# Patient Record
Sex: Male | Born: 1970 | Race: White | Hispanic: No | Marital: Married | Smoking: Never smoker
Health system: Southern US, Community
[De-identification: ages and names within clinical notes are randomized; demographics above are authoritative.]

## PROBLEM LIST (undated history)

## (undated) DIAGNOSIS — IMO0002 Reserved for concepts with insufficient information to code with codable children: Secondary | ICD-10-CM

## (undated) DIAGNOSIS — I10 Essential (primary) hypertension: Secondary | ICD-10-CM

## (undated) DIAGNOSIS — E1142 Type 2 diabetes mellitus with diabetic polyneuropathy: Secondary | ICD-10-CM

## (undated) DIAGNOSIS — E109 Type 1 diabetes mellitus without complications: Secondary | ICD-10-CM

## (undated) DIAGNOSIS — E785 Hyperlipidemia, unspecified: Secondary | ICD-10-CM

## (undated) HISTORY — DX: Hyperlipidemia, unspecified: E78.5

---

## 2005-10-13 ENCOUNTER — Ambulatory Visit: Payer: Self-pay | Admitting: Family Medicine

## 2014-07-12 ENCOUNTER — Inpatient Hospital Stay
Admission: AD | Admit: 2014-07-12 | Disposition: A | Discharge status: Home / Self Care | Payer: Self-pay | Source: Other Acute Inpatient Hospital | Attending: Internal Medicine | Admitting: Internal Medicine

## 2014-07-12 ENCOUNTER — Other Ambulatory Visit: Payer: Self-pay | Admitting: Gastroenterology

## 2014-07-12 ENCOUNTER — Encounter: Payer: Self-pay | Admitting: Sleep Medicine

## 2014-07-12 DIAGNOSIS — R7881 Bacteremia: Principal | ICD-10-CM | POA: Diagnosis present

## 2014-07-12 DIAGNOSIS — B9561 Methicillin susceptible Staphylococcus aureus infection as the cause of diseases classified elsewhere: Principal | ICD-10-CM | POA: Diagnosis present

## 2014-07-12 HISTORY — DX: Reserved for concepts with insufficient information to code with codable children: IMO0002

## 2014-07-12 LAB — URINALYSIS REFLEX TO CULTURE
Glucose,UA: 1000 mg/dL — AB
Leuk Esterase,UA: NEGATIVE
Nitrite,UA: NEGATIVE
Protein,UA: 25 mg/dL — AB
Specific Gravity,UA: 1.012 (ref 1.001–1.030)
pH,UA: 7 (ref 5.0–8.0)

## 2014-07-12 LAB — COMPREHENSIVE METABOLIC PANEL
ALT: 41 U/L (ref 0–50)
AST: 29 U/L (ref 0–50)
Albumin: 3 g/dL — ABNORMAL LOW (ref 3.5–5.2)
Alk Phos: 39 U/L — ABNORMAL LOW (ref 40–130)
Anion Gap: 18 — ABNORMAL HIGH (ref 7–16)
Bilirubin,Total: 0.6 mg/dL (ref 0.0–1.2)
CO2: 22 mmol/L (ref 20–28)
Calcium: 8.2 mg/dL — ABNORMAL LOW (ref 9.0–10.3)
Chloride: 99 mmol/L (ref 96–108)
Creatinine: 0.72 mg/dL (ref 0.67–1.17)
GFR,Black: 131 *
GFR,Caucasian: 113 *
Globulin: 2.5 g/dL — ABNORMAL LOW (ref 2.7–4.3)
Glucose: 258 mg/dL — ABNORMAL HIGH (ref 60–99)
Lab: 8 mg/dL (ref 6–20)
Potassium: 3.2 mmol/L — ABNORMAL LOW (ref 3.3–5.1)
Sodium: 139 mmol/L (ref 133–145)
Total Protein: 5.5 g/dL — ABNORMAL LOW (ref 6.3–7.7)

## 2014-07-12 LAB — CBC AND DIFFERENTIAL
Baso # K/uL: 0 10*3/uL (ref 0.0–0.1)
Basophil %: 0.3 %
Eos # K/uL: 0 10*3/uL (ref 0.0–0.5)
Eosinophil %: 0.5 %
Hematocrit: 33 % — ABNORMAL LOW (ref 40–51)
Hemoglobin: 11.6 g/dL — ABNORMAL LOW (ref 13.7–17.5)
IMM Granulocytes #: 0.2 10*3/uL — ABNORMAL HIGH (ref 0.0–0.1)
IMM Granulocytes: 2.6 %
Lymph # K/uL: 0.5 10*3/uL — ABNORMAL LOW (ref 1.3–3.6)
Lymphocyte %: 8.3 %
MCH: 29 pg (ref 26–32)
MCHC: 35 g/dL (ref 32–37)
MCV: 81 fL (ref 79–92)
Mono # K/uL: 0.7 10*3/uL (ref 0.3–0.8)
Monocyte %: 12 %
Neut # K/uL: 4.4 10*3/uL (ref 1.8–5.4)
Nucl RBC # K/uL: 0 10*3/uL (ref 0.0–0.0)
Nucl RBC %: 0 /100 WBC (ref 0.0–0.2)
Platelets: 143 10*3/uL — ABNORMAL LOW (ref 150–330)
RBC: 4.1 MIL/uL — ABNORMAL LOW (ref 4.6–6.1)
RDW: 12.9 % (ref 11.6–14.4)
Seg Neut %: 76.3 %
WBC: 5.8 10*3/uL (ref 4.2–9.1)

## 2014-07-12 LAB — LACTATE, VENOUS, WHOLE BLOOD: Lactate VEN,WB: 1.4 mmol/L (ref 0.5–2.2)

## 2014-07-12 LAB — MAGNESIUM: Magnesium: 1.4 mEq/L (ref 1.3–2.1)

## 2014-07-12 LAB — PHOSPHORUS: Phosphorus: 1.7 mg/dL — ABNORMAL LOW (ref 2.7–4.5)

## 2014-07-12 LAB — SEDIMENTATION RATE, AUTOMATED: Sedimentation Rate: 78 mm/hr — ABNORMAL HIGH (ref 0–15)

## 2014-07-12 LAB — POCT GLUCOSE: Glucose POCT: 209 mg/dL — ABNORMAL HIGH (ref 60–99)

## 2014-07-12 MED ORDER — DEXTROSE 50 % IV SOLN *I*
25.0000 g | INTRAVENOUS | Status: DC | PRN
Start: 2014-07-12 — End: 2014-07-19

## 2014-07-12 MED ORDER — ACETAMINOPHEN 325 MG PO TABS *I*
650.0000 mg | ORAL_TABLET | ORAL | Status: DC | PRN
Start: 2014-07-12 — End: 2014-07-19
  Administered 2014-07-13 – 2014-07-19 (×6): 650 mg via ORAL

## 2014-07-12 MED ORDER — ONDANSETRON HCL 2 MG/ML IV SOLN *I*
4.0000 mg | Freq: Four times a day (QID) | INTRAMUSCULAR | Status: DC | PRN
Start: 2014-07-12 — End: 2014-07-19

## 2014-07-12 MED ORDER — ENOXAPARIN SODIUM 40 MG/0.4ML IJ SOSY *I*
40.0000 mg | PREFILLED_SYRINGE | Freq: Every day | INTRAMUSCULAR | Status: DC
Start: 2014-07-13 — End: 2014-07-19
  Administered 2014-07-13 – 2014-07-18 (×6): 40 mg via SUBCUTANEOUS
  Filled 2014-07-12 (×6): qty 0.4

## 2014-07-12 MED ORDER — INSULIN LISPRO (HUMAN) 100 UNIT/ML IJ/SC SOLN *WRAPPED*
0.0000 [IU] | Freq: Three times a day (TID) | SUBCUTANEOUS | Status: DC
Start: 2014-07-13 — End: 2014-07-14
  Administered 2014-07-13: 4 [IU] via SUBCUTANEOUS
  Administered 2014-07-13 (×2): 3 [IU] via SUBCUTANEOUS
  Administered 2014-07-14: 4 [IU] via SUBCUTANEOUS

## 2014-07-12 MED ORDER — GLUCOSE 40 % PO GEL *I*
15.0000 g | ORAL | Status: DC | PRN
Start: 2014-07-12 — End: 2014-07-19

## 2014-07-12 MED ORDER — SODIUM CHLORIDE 0.9 % IV SOLN WRAPPED *I*
50.0000 mL/h | Status: DC
Start: 2014-07-12 — End: 2014-07-13
  Administered 2014-07-12: 125 mL/h via INTRAVENOUS
  Administered 2014-07-13: 50 mL/h via INTRAVENOUS

## 2014-07-12 MED ORDER — GLUCAGON HCL (RDNA) 1 MG IJ SOLR *WRAPPED*
1.0000 mg | INTRAMUSCULAR | Status: DC | PRN
Start: 2014-07-12 — End: 2014-07-19

## 2014-07-12 MED ORDER — INSULIN GLARGINE 100 UNIT/ML SC SOLN *WRAPPED*
10.0000 [IU] | Freq: Every evening | SUBCUTANEOUS | Status: DC
Start: 2014-07-12 — End: 2014-07-13
  Administered 2014-07-12: 10 [IU] via SUBCUTANEOUS

## 2014-07-12 NOTE — H&P (Addendum)
General H&P for Inpatients    Chief Complaint: Bacteremia    History of Present Illness: This is a 43 y.o. Male with hx of uncontrolled DM (last HbA1c reportedly 13) who was transferred to [facility] from OSH for further management of S. Aureus bacteremia.     The patient lives in [place] and is up visiting relatives in [place]. States that about one week ago he developed non-productive cough, fevers, chills, rigors, nausea and non-bloody vomiting and subsequently presented to OSH 4 days ago. OSH documentation shows he had documented fever 38.6, HR 120-130 on presentation, normal WBC and HCT, AG 32, HC03 16, +urine ketones, Sodium 132, BG 349. He was admitted and subsequently found to have 4/4 blood cx growing S. Aureus ?due to ulcerations on soles of feet. Although notes mention that the lucerations did not appear cellulitic.  He was started on IV vancomycin. Transthoracic echo showed LVEF 04%, no systolic dysfunction, no vegetations, no pericardial effusion. However the study was somewhat equivocal with technically borderline images. Xray of foot was not indicative of osteomyelitis. CXR reportedly was without abnormality. CRP elevated at 30. Lactate normal. The plantar ulcerations were debrided by podiatry at OSH. He was started on metformin (home med), levemir, Lipitor, losartan, gabapentin. During his 4 day stay at OSH he remained HD stable with normal repeat lactate. ABG today showed 7.42/23/85 today. He did develop diarrhea while at OSH and a Cdiff EIA was sent, but not resulted. He was transferred to [facility] for transesophageal echo and further workup/management of staph aureus bacteremia.     Regarding the patient's state of health leading up to this episode, he states he has been in good health, but drinks a lot of "mountain dew" although he knows he should not. He states that the blisters on his feet have been present for about 6 months, but only about 2 weeks ago he noticed foul smelling drainage from the right  foot. Otherwise he has not had any pain in the feet or in the areas of the ulcers and does not think they are infected.     Patient denies CP, abdominal pain urinary sx. Denies IV drug use.     HPI    Past Medical History   Diagnosis Date    Diabetes mellitus type 2, uncontrolled      No past surgical history on file.  No family history on file.  History     Social History    Marital Status: Married     Spouse Name: N/A     Number of Children: N/A    Years of Education: N/A     Social History Main Topics    Smoking status: Not on file    Smokeless tobacco: Not on file    Alcohol Use: Not on file    Drug Use: Not on file    Sexual Activity: Not on file     Other Topics Concern    Not on file     Social History Narrative    No narrative on file       Allergies: No Known Allergies (drug, envir, food or latex)    Prescriptions prior to admission   Medication Sig    metFORMIN (GLUCOPHAGE) 1000 MG tablet Take 1,000 mg by mouth 2 times daily (with meals)      Current Facility-Administered Medications   Medication Dose Route Frequency    [START ON 07/13/2014] enoxaparin (LOVENOX) injection 40 mg  40 mg Subcutaneous Daily    sodium  chloride 0.9 % IV  125 mL/hr Intravenous Continuous    acetaminophen (TYLENOL) tablet 650 mg  650 mg Oral Q4H PRN    ondansetron (ZOFRAN) injection 4 mg  4 mg Intravenous Q6H PRN    [START ON 07/13/2014] insulin lispro (HumaLOG) injection 0-20 Units  0-20 Units Subcutaneous TID WC    dextrose (GLUTOSE) 40 % oral gel 15 g  15 g Oral PRN    Dextrose 50 % solution 25 g  25 g Intravenous PRN    glucagon (GLUCAGEN) injection 1 mg  1 mg Intramuscular PRN    insulin glargine (LANTUS) injection 10 Units  10 Units Subcutaneous Nightly       Review of Systems:   Review of Systems   Constitutional: Positive for fever, chills and malaise/fatigue. Negative for weight loss.   HENT: Positive for congestion and sore throat.    Eyes: Negative for blurred vision, double vision and photophobia.    Respiratory: Positive for cough. Negative for hemoptysis, sputum production, shortness of breath and wheezing.    Cardiovascular: Negative for chest pain and leg swelling.   Gastrointestinal: Positive for nausea, vomiting and diarrhea. Negative for abdominal pain, constipation and blood in stool.   Genitourinary: Negative for dysuria and urgency.   Musculoskeletal: Positive for myalgias. Negative for neck pain.   Neurological: Negative for dizziness, sensory change, speech change, focal weakness and headaches.       Last Nursing documented pain:        Patient Vitals for the past 24 hrs:   BP Temp Temp src Pulse Resp SpO2   07/12/14 2000 128/82 mmHg 36.9 C (98.4 F) Axillary 94 20 95 %     O2 Device: Nasal cannula (07/12/14 2000)  O2 Flow Rate: 4 L/min (07/12/14 2000)      Physical Exam   Constitutional: He is oriented to person, place, and time. He appears well-developed and well-nourished.   HENT:   Head: Normocephalic and atraumatic.   Dry mucous membranes   Eyes: EOM are normal. Pupils are equal, round, and reactive to light.   Neck: Normal range of motion. Neck supple. No tracheal deviation present.   Cardiovascular: Normal rate and regular rhythm.    Pulmonary/Chest: Effort normal and breath sounds normal. No respiratory distress. He has no wheezes. He has no rales.   Abdominal: Soft. Bowel sounds are normal. He exhibits no distension and no mass. There is no tenderness. There is no rebound and no guarding.   Musculoskeletal: He exhibits no edema or tenderness.   Neurological: He is alert and oriented to person, place, and time.   Skin: Skin is warm.   Bilat plantar ulcerations under 1st MTP joints with dried blood. No erythema or drainage. No induration around the sites or pain with palpation around the ulcerations.       Lab Results:   All labs in the last 24 hours:   Recent Results (from the past 24 hour(s))   POCT glucose    Collection Time: 07/12/14 10:13 PM   Result Value Ref Range    Glucose POCT 209  (H) 60 - 99 mg/dL   Urinalysis reflex to culture    Collection Time: 07/12/14 11:00 PM   Result Value Ref Range    Color, UA Lt Yellow (A) Yellow    Appearance,UR CLEAR Clear    Specific Gravity,UA 1.012 1.001 - 1.030    Leuk Esterase,UA NEG NEGATIVE    Nitrite,UA NEG NEGATIVE    pH,UA 7.0 5.0 - 8.0  Protein,UA 25 (A) NEGATIVE mg/dL    Glucose,UA 1000 (A) mg/dL    Ketones, UA 3+ (A) NEGATIVE    Blood,UA TRACE (A) NEGATIVE   CBC and differential    Collection Time: 07/12/14 11:17 PM   Result Value Ref Range    WBC 5.8 4.2 - 9.1 THOU/uL    RBC 4.1 (L) 4.6 - 6.1 MIL/uL    Hemoglobin 11.6 (L) 13.7 - 17.5 g/dL    Hematocrit 33 (L) 40 - 51 %    MCV 81 79 - 92 fL    MCH 29 26 - 32 pg    MCHC 35 32 - 37 g/dL    RDW 12.9 11.6 - 14.4 %    Platelets 143 (L) 150 - 330 THOU/uL    Seg Neut % 76.3 %    Lymphocyte % 8.3 %    Monocyte % 12.0 %    Eosinophil % 0.5 %    Basophil % 0.3 %    Neut # K/uL 4.4 1.8 - 5.4 THOU/uL    Lymph # K/uL 0.5 (L) 1.3 - 3.6 THOU/uL    Mono # K/uL 0.7 0.3 - 0.8 THOU/uL    Eos # K/uL 0.0 0.0 - 0.5 THOU/uL    Baso # K/uL 0.0 0.0 - 0.1 THOU/uL    Nucl RBC % 0.0 0.0 - 0.2 /100 WBC    Nucl RBC # K/uL 0.0 0.0 - 0.0 THOU/uL    IMM Granulocytes # 0.2 (H) 0.0 - 0.1 THOU/uL    IMM Granulocytes 2.6 %   Lactate, venous, whole blood    Collection Time: 07/12/14 11:17 PM   Result Value Ref Range    Lactate VEN,WB 1.4 0.5 - 2.2 mmol/L       Radiology Impressions (last 3 days):  No results found.    Currently Active/Followed Hospital Problems:  Active Hospital Problems    Diagnosis    Bacteremia due to Staphylococcus aureus       Assessment:  This is a 43 y.o. Male with hx of uncontrolled DM (last HbA1c reportedly 13) who was transferred to [facility] from OSH for further management of S. Aureus bacteremia. He had presented to OSH with mild DKA and was found to have 4/4 blood cx growing S.Aureus. Unclear etiology for bacteremia ?inoculation from plantar ulcerations, however they do not appear cellulitic. TTE benign at OSH,  but he will need TEE and he is aware of this.       Plan:   - NPO at [facility] for TEE  - ABX: Will need AM vanco level at 0800. Vanco was still infusing upon his arrival at~2000. (OSH was dosing Vanco 750 mg IV Q12) Will need to re-dose with pharmacy here once the level is obtained in AM  - IVF: NS at 125 ml/hr  - Obtain fresh labs: CBCw/diff, CMP, CRP, ESR, blood cx x 2, lactate, UA  - Need to evaluate his acid/base status /presence of ketones because it appears that he was acidotic today at OSH  - ?on 4L NC unclear why. Pulm exam ok. No mention of hypoxia in OSH notes. Will up load CXR from disc and attempt to wean. If unable will continue working this up.  - Hold metformin, start SSI with 10 units nightly lantus (titrate up as needed)  - Day team to determine continuing ARB and statin which were started by OSH  - DVT ppx- SQ lovenox  - Full code    Author: Alfonse Spruce, [place]  Note created: 07/12/2014  at: 10:52 PM     Addendum: 2:25 AM 07/13/2014  Patient unable to be weaned from 4L NC, so portable CXR obtained. D/w radiologist. Pulmonary congestion c/w pulmonary edema on chest film. Atypical PNA possible as well.    Doubt Staph PNA in an otherwise healthy, functional individual living in the community. So, doubt pulm infection as source for bacteramia. It is more likely that he has some fluid overload from IVF resuscitation at OSH. BNP added on and is elevated at 1180.     Will decrease IVF to 50 ml/hr while NPO. Hopefully he will auto diurese since he has good renal function. If not, may need to trial 1 x dose of lasix once he is off IVF tomorrow. Strict I/Os. Consider lateral chest film as well.

## 2014-07-13 ENCOUNTER — Encounter: Payer: Self-pay | Admitting: Sleep Medicine

## 2014-07-13 ENCOUNTER — Ambulatory Visit: Payer: Self-pay | Admitting: Cardiology

## 2014-07-13 ENCOUNTER — Inpatient Hospital Stay: Payer: Self-pay | Admitting: Cardiology

## 2014-07-13 DIAGNOSIS — I38 Endocarditis, valve unspecified: Secondary | ICD-10-CM | POA: Diagnosis present

## 2014-07-13 LAB — TEE
BMI: 30.7 kg/m2
BP Diastolic: 84 mmHg
BP Systolic: 120 mmHg
BSA: 2.28 m2
Heart Rate: 75 {beats}/min
Height: 72 in
RR Interval: 800 ms
Weight: 3616 oz

## 2014-07-13 LAB — POCT GLUCOSE
Glucose POCT: 244 mg/dL — ABNORMAL HIGH (ref 60–99)
Glucose POCT: 255 mg/dL — ABNORMAL HIGH (ref 60–99)
Glucose POCT: 229 mg/dL — ABNORMAL HIGH (ref 60–99)
Glucose POCT: 287 mg/dL — ABNORMAL HIGH (ref 60–99)
Glucose POCT: 320 mg/dL — ABNORMAL HIGH (ref 60–99)

## 2014-07-13 LAB — BASIC METABOLIC PANEL
Anion Gap: 16 (ref 7–16)
CO2: 23 mmol/L (ref 20–28)
Calcium: 8.2 mg/dL — ABNORMAL LOW (ref 9.0–10.3)
Chloride: 100 mmol/L (ref 96–108)
Creatinine: 0.72 mg/dL (ref 0.67–1.17)
GFR,Black: 131 *
GFR,Caucasian: 113 *
Glucose: 239 mg/dL — ABNORMAL HIGH (ref 60–99)
Lab: 10 mg/dL (ref 6–20)
Potassium: 3.5 mmol/L (ref 3.3–5.1)
Sodium: 139 mmol/L (ref 133–145)

## 2014-07-13 LAB — HOLD SST

## 2014-07-13 LAB — URINE MICROSCOPIC (IQ200)

## 2014-07-13 LAB — DRUGS OF ABUSE SCREEN WITH CONFIRMATION
Amphetamine,UR: NEGATIVE
Barbiturate,UR: NEGATIVE
Benzodiazepinen,UR: POSITIVE — AB
Cocaine/Metab,UR: NEGATIVE
Opiates,UR: NEGATIVE
PCP,UR: NEGATIVE
THC Metabolite,UR: NEGATIVE
Tricyclics,UR: NEGATIVE

## 2014-07-13 LAB — CLOSTRIDIUM DIFFICILE EIA: C difficile Toxins A and B EIA: 0

## 2014-07-13 LAB — NT-PRO BNP: NT-pro BNP: 1180 pg/mL — ABNORMAL HIGH (ref 0–450)

## 2014-07-13 LAB — PHOSPHORUS: Phosphorus: 2.7 mg/dL (ref 2.7–4.5)

## 2014-07-13 LAB — CRP: CRP: 217 mg/L — ABNORMAL HIGH (ref 0–10)

## 2014-07-13 LAB — VANCOMYCIN, RANDOM: Vancomycin Level: 3.7 ug/mL

## 2014-07-13 MED ORDER — INFLUENZA VAC SPLIT QUAD (FLULAVAL) 0.5 ML IM SUSY PF(>=6 MONTHS) *I*
0.5000 mL | PREFILLED_SYRINGE | INTRAMUSCULAR | Status: AC
Start: 2014-07-13 — End: 2014-07-19
  Administered 2014-07-19: 0.5 mL via INTRAMUSCULAR
  Filled 2014-07-13: qty 0.5

## 2014-07-13 MED ORDER — BUTAMBEN-TETRACAINE-BENZOCAINE 2-2-14 % EX AERO *I*
1.0000 | INHALATION_SPRAY | Freq: Once | CUTANEOUS | Status: AC
Start: 2014-07-13 — End: 2014-07-13
  Filled 2014-07-13: qty 56

## 2014-07-13 MED ORDER — FENTANYL CITRATE 50 MCG/ML IJ SOLN *WRAPPED*
INTRAMUSCULAR | Status: AC
Start: 2014-07-13 — End: 2014-07-13
  Filled 2014-07-13: qty 2

## 2014-07-13 MED ORDER — MIDAZOLAM HCL 1 MG/ML IJ SOLN *I* WRAPPED
INTRAMUSCULAR | Status: AC | PRN
Start: 2014-07-13 — End: 2014-07-13
  Administered 2014-07-13: 2 mg via INTRAVENOUS
  Administered 2014-07-13: 1 mg via INTRAVENOUS
  Administered 2014-07-13: 2 mg via INTRAVENOUS
  Administered 2014-07-13: 1 mg via INTRAVENOUS

## 2014-07-13 MED ORDER — MIDAZOLAM HCL 5 MG/5ML IJ SOLN *I*
INTRAMUSCULAR | Status: AC
Start: 2014-07-13 — End: 2014-07-13
  Filled 2014-07-13: qty 5

## 2014-07-13 MED ORDER — GUAIFENESIN 100 MG/5ML PO SYRUP *WRAPPED*
200.0000 mg | ORAL_SOLUTION | Freq: Four times a day (QID) | ORAL | Status: DC | PRN
Start: 2014-07-13 — End: 2014-07-19
  Administered 2014-07-14 – 2014-07-16 (×4): 200 mg via ORAL
  Filled 2014-07-13 (×4): qty 10

## 2014-07-13 MED ORDER — POTASSIUM & SODIUM PHOSPHATES 280-160-250 MG PO PACK *I*
2.0000 | PACK | Freq: Once | ORAL | Status: AC
Start: 2014-07-13 — End: 2014-07-13
  Administered 2014-07-13: 2 via ORAL
  Filled 2014-07-13: qty 2

## 2014-07-13 MED ORDER — POTASSIUM CHLORIDE CRYS CR 20 MEQ PO TBCR *I*
20.0000 meq | ORAL_TABLET | Freq: Once | ORAL | Status: AC
Start: 2014-07-13 — End: 2014-07-13
  Administered 2014-07-13: 20 meq via ORAL
  Filled 2014-07-13: qty 1

## 2014-07-13 MED ORDER — FENTANYL CITRATE 50 MCG/ML IJ SOLN *WRAPPED*
INTRAMUSCULAR | Status: AC | PRN
Start: 2014-07-13 — End: 2014-07-13
  Administered 2014-07-13: 25 ug via INTRAVENOUS
  Administered 2014-07-13 (×2): 50 ug via INTRAVENOUS

## 2014-07-13 MED ORDER — SODIUM CHLORIDE 0.9 % IV SOLN WRAPPED *I*
30.0000 mL/h | Status: DC
Start: 2014-07-13 — End: 2014-07-13
  Administered 2014-07-13: 30 mL/h via INTRAVENOUS

## 2014-07-13 MED ORDER — POTASSIUM CHLORIDE CRYS CR 20 MEQ PO TBCR *I*
40.0000 meq | ORAL_TABLET | Freq: Once | ORAL | Status: AC
Start: 2014-07-13 — End: 2014-07-13
  Administered 2014-07-13: 40 meq via ORAL
  Filled 2014-07-13: qty 2

## 2014-07-13 MED ORDER — INSULIN GLARGINE 100 UNIT/ML SC SOLN *WRAPPED*
15.0000 [IU] | Freq: Every evening | SUBCUTANEOUS | Status: DC
Start: 2014-07-13 — End: 2014-07-14
  Administered 2014-07-13: 15 [IU] via SUBCUTANEOUS

## 2014-07-13 MED ORDER — VANCOMYCIN HCL IN D5W 2000 MG/500 ML IV SOLN *I*
2000.0000 mg | Freq: Two times a day (BID) | INTRAVENOUS | Status: DC
Start: 2014-07-13 — End: 2014-07-15
  Administered 2014-07-13 – 2014-07-14 (×3): 2000 mg via INTRAVENOUS
  Filled 2014-07-13 (×5): qty 500

## 2014-07-13 MED ORDER — FUROSEMIDE 10 MG/ML IJ SOLN *I*
20.0000 mg | Freq: Once | INTRAMUSCULAR | Status: AC
Start: 2014-07-13 — End: 2014-07-13
  Administered 2014-07-13: 20 mg via INTRAVENOUS
  Filled 2014-07-13: qty 2

## 2014-07-13 MED ORDER — MENTHOL THROAT LOZENGE *I*
1.0000 | LOZENGE | OROMUCOSAL | Status: DC | PRN
Start: 2014-07-13 — End: 2014-07-19

## 2014-07-13 NOTE — Progress Notes (Signed)
Utilization Management    Level of Care Inpatient as of the date 07/12/14      Symir Mah A Evadene Wardrip, RN     Pager: [phone]

## 2014-07-13 NOTE — Preop H&P (Addendum)
Subjective:     Patient with a history of bacteremia. He was admitted with complaints of fever, dyspnea. Now referred for TEE to evaluate for endocarditis.    Additional Risk Factors  Current CHF  Chest xray showing pulmonary congestion.     History:  Past Medical History   Diagnosis Date    Diabetes mellitus type 2, uncontrolled     Diabetes mellitus        Allergies: No Known Allergies (drug, envir, food or latex)      Prior to Admission Medications:    Prescriptions prior to admission   Medication Sig    metFORMIN (GLUCOPHAGE) 1000 MG tablet Take 1,000 mg by mouth 2 times daily (with meals)         Active Medications:  Current Facility-Administered Medications   Medication Dose Route Frequency    Influenza vaccine (FLUARIX) syringe 0.75mL - Quad, PF, for patients aged 3 years and older  0.5 mL Intramuscular Prior to discharge -vaccine    sodium chloride 0.9 % IV  30 mL/hr Intravenous Continuous    butamben-tetracaine-benzocaine (CETACAINE) spray 1 spray  1 spray Topical Once    enoxaparin (LOVENOX) injection 40 mg  40 mg Subcutaneous Daily    sodium chloride 0.9 % IV  50 mL/hr Intravenous Continuous    acetaminophen (TYLENOL) tablet 650 mg  650 mg Oral Q4H PRN    ondansetron (ZOFRAN) injection 4 mg  4 mg Intravenous Q6H PRN    insulin lispro (HumaLOG) injection 0-20 Units  0-20 Units Subcutaneous TID WC    dextrose (GLUTOSE) 40 % oral gel 15 g  15 g Oral PRN    Dextrose 50 % solution 25 g  25 g Intravenous PRN    glucagon (GLUCAGEN) injection 1 mg  1 mg Intramuscular PRN    insulin glargine (LANTUS) injection 10 Units  10 Units Subcutaneous Nightly          Objective:     Physical Exam  Vitals:  Blood pressure 125/70, pulse 89, temperature 36.4 C (97.5 F), temperature source Temporal, resp. rate 25, height 182.9 cm (6'), weight 102.513 kg (226 lb), SpO2 96 %.           Dentition:No loose or broken teeth.    Jugular venous pressure: Not well seen    Airway Visibility: posterior pharynx    Breath  sounds: rales at bases  Cardiovascular:  normal S1,S2 without murmur, rubs, gallops    Neuro exam: awake, alert, and oriented x 3  Lab Review     Recent Labs  Lab 07/13/14  0939 07/12/14  2317   SODIUM 139 139   POTASSIUM 3.5 3.2*   CHLORIDE 100 99   CO2 23 22   HEMOGLOBIN  --  11.6*   HEMATOCRIT  --  33*   PLATELETS  --  143*       No components found with this basename: BUN, CREATININE, EGFR, GLUCOSE      Anesthesiologist's Physical Status rating of the patient: Class III: Severe Systemic Disease    Plan for sedation: Moderate  I am evaluating the patient immediately prior to admission of sedation medication. The plan for sedation remains appropriate.      Assessment:    Indications for procedure: Bacteremia     Plan:   TEE

## 2014-07-13 NOTE — Progress Notes (Signed)
Pharmacist Pharmacokinetic Note - Vancomycin    General  Patient was started on vancomycin for: bacteremia      First dose: 2000    Goal vancomycin trough concentration: 15-20 mcg/mL    Laboratory Data      Lab results: 07/12/14  2317   WBC 5.8         Lab results: 07/13/14  0939 07/12/14  2317   CREATININE 0.72 0.72         Lab results: 07/13/14  0939 07/12/14  2317   UN 10 8           Vancomycin concentrations:        Lab results: 07/13/14  0939   VANCOMYCIN LEVEL 3.7           Recommendation  Based on calculations, patient's renal function, and volume status, recommend the following dose: 2000 mg IV every 12 hours.     Recommend vancomycin trough concentration to be drawn on: 1/2 prior to 0300 dose     Discussed plan with provider Larey DaysMichael Ferrantino, MD.   For questions contact pharmacy at extension [phone].      Johnnye SimaAndrew Willma Obando, PharmD

## 2014-07-13 NOTE — H&P (Signed)
General H&P for Inpatients    Chief Complaint: MRSA bacteremia    History of Present Illness:  HPI  43 yo man with poorly controlled type 2 diabetes, who presented to [facility] on 12/27 with symptoms including nausea, vomiting, headache, congestion, dry cough, poor po intake, and fevers/rigors/chills. He is visiting [place] from his home in [place]. Initially he was felt to have a viral syndrome, however multiple blood culture bottles grew MRSA. He was placed on IV vancomycin. TTE showed no evidence for endocarditis, but was a limited study. He denies any history of IVDU. He has bilateral foot callouses but they have not been open or draining. He was transferred to [facility] for further workup and management.    Past Medical History   Diagnosis Date    Diabetes mellitus type 2, uncontrolled      History reviewed. No pertinent past surgical history.  Family History   Problem Relation Age of Onset    Diabetes Mother     Diabetes Father      History     Social History    Marital Status: Married     Spouse Name: N/A     Number of Children: N/A    Years of Education: N/A     Social History Main Topics    Smoking status: Never Smoker     Smokeless tobacco: None    Alcohol Use: No    Drug Use: No    Sexual Activity: None     Other Topics Concern    None     Social History Narrative       Allergies: No Known Allergies (drug, envir, food or latex)    Prescriptions prior to admission   Medication Sig    metFORMIN (GLUCOPHAGE) 1000 MG tablet Take 1,000 mg by mouth 2 times daily (with meals)      Current Facility-Administered Medications   Medication Dose Route Frequency    Influenza vaccine (FLUARIX) syringe 0.74mL - Quad, PF, for patients aged 3 years and older  0.5 mL Intramuscular Prior to discharge -vaccine    butamben-tetracaine-benzocaine (CETACAINE) spray 1 spray  1 spray Topical Once    vancomycin in D5W (VANCOCIN) IVPB 2,000 mg  2,000 mg Intravenous Q12H    enoxaparin (LOVENOX) injection  40 mg  40 mg Subcutaneous Daily    acetaminophen (TYLENOL) tablet 650 mg  650 mg Oral Q4H PRN    ondansetron (ZOFRAN) injection 4 mg  4 mg Intravenous Q6H PRN    insulin lispro (HumaLOG) injection 0-20 Units  0-20 Units Subcutaneous TID WC    dextrose (GLUTOSE) 40 % oral gel 15 g  15 g Oral PRN    Dextrose 50 % solution 25 g  25 g Intravenous PRN    glucagon (GLUCAGEN) injection 1 mg  1 mg Intramuscular PRN    insulin glargine (LANTUS) injection 10 Units  10 Units Subcutaneous Nightly       Review of Systems:   Review of Systems   Constitutional: Positive for fever and chills.   Respiratory: Positive for cough and shortness of breath.    Cardiovascular: Negative for chest pain.   Gastrointestinal: Positive for nausea, vomiting and diarrhea. Negative for abdominal pain.   Skin: Negative for rash.   Neurological: Positive for headaches.   All other systems reviewed and are negative.      Last Nursing documented pain:  0-10 Scale: 0 (07/13/14 1317)  Revised FLACC Score: 0 (07/13/14 1248)  Patient Vitals for the past 24 hrs:   BP Temp Temp src Pulse Resp SpO2 Height Weight   07/13/14 1412 130/80 mmHg 36 C (96.8 F) TEMPORAL 93 18 96 % - -   07/13/14 1317 100/61 mmHg - - 85 19 95 % - -   07/13/14 1307 106/62 mmHg - - 84 20 94 % - -   07/13/14 1257 104/63 mmHg - - 84 20 92 % - -   07/13/14 1248 115/66 mmHg - - 85 20 95 % - -   07/13/14 1242 117/67 mmHg - - 87 16 95 % - -   07/13/14 1237 118/67 mmHg - - 85 16 97 % - -   07/13/14 1232 122/68 mmHg - - 89 20 93 % - -   07/13/14 1227 142/77 mmHg - - 95 24 96 % - -   07/13/14 1209 125/70 mmHg - - 89 (!) 25 96 % - -   07/13/14 1143 123/70 mmHg - - 85 20 95 % - -   07/13/14 1139 - 36.4 C (97.5 F) TEMPORAL - - - 1.829 m (6') 102.513 kg (226 lb)   07/13/14 0932 122/80 mmHg 36.8 C (98.2 F) TEMPORAL 88 20 93 % - -   07/13/14 0615 132/90 mmHg 36.9 C (98.4 F) TEMPORAL 98 20 94 % - -   07/13/14 0252 128/82 mmHg 37.1 C (98.8 F) TEMPORAL 107 22 94 % - -   07/12/14 2335  - - - - - 94 % - -   07/12/14 2330 - - - - - 91 % - -   07/12/14 2325 - - - - - (!) 87 % - -   07/12/14 2320 - - - - - 90 % - -   07/12/14 2315 - - - - - 94 % - -   07/12/14 2000 128/82 mmHg 36.9 C (98.4 F) Axillary 94 20 95 % 1.829 m (6') 102.649 kg (226 lb 4.8 oz)     O2 Device: Nasal cannula (07/13/14 1317)  O2 Flow Rate: 4 L/min (07/13/14 1317)      Physical Exam   Constitutional: He appears well-developed and well-nourished. No distress.   HENT:   Head: Normocephalic and atraumatic.   Mouth/Throat: Oropharynx is clear and moist. No oropharyngeal exudate.   Eyes: Conjunctivae are normal. No scleral icterus.   Neck: Neck supple.   Cardiovascular: Normal rate, regular rhythm and normal heart sounds.  Exam reveals no gallop and no friction rub.    No murmur heard.  Pulmonary/Chest: Effort normal. No respiratory distress. He has no wheezes. He has rales.   Abdominal: Soft. Bowel sounds are normal. He exhibits no distension. There is no tenderness. There is no rebound and no guarding.   Musculoskeletal: He exhibits no edema or tenderness.   Lymphadenopathy:     He has no cervical adenopathy.   Neurological: He is alert.   Skin: Skin is warm and dry. No erythema.   Bilateral callouses on plantar first metatarsal surface, no open wounds or drainage   Psychiatric: He has a normal mood and affect.       Lab Results:   All labs in the last 24 hours:   Recent Results (from the past 24 hour(s))   POCT glucose    Collection Time: 07/12/14 10:13 PM   Result Value Ref Range    Glucose POCT 209 (H) 60 - 99 mg/dL   Clostridium difficile EIA    Collection Time: 07/12/14 11:00 PM  Result Value Ref Range    C difficile Toxins A&B EIA .    Urinalysis reflex to culture    Collection Time: 07/12/14 11:00 PM   Result Value Ref Range    Color, UA Lt Yellow (A) Yellow    Appearance,UR CLEAR Clear    Specific Gravity,UA 1.012 1.001 - 1.030    Leuk Esterase,UA NEG NEGATIVE    Nitrite,UA NEG NEGATIVE    pH,UA 7.0 5.0 - 8.0    Protein,UA  25 (A) NEGATIVE mg/dL    Glucose,UA 1000 (A) mg/dL    Ketones, UA 3+ (A) NEGATIVE    Blood,UA TRACE (A) NEGATIVE   Urine microscopic (iq200)    Collection Time: 07/12/14 11:00 PM   Result Value Ref Range    RBC,UA 0 - 2 0 - 2 /hpf    WBC,UA 0 - 5 0 - 5 /hpf   Blood culture    Collection Time: 07/12/14 11:16 PM   Result Value Ref Range    Bacterial Blood Culture .    Blood culture    Collection Time: 07/12/14 11:16 PM   Result Value Ref Range    Bacterial Blood Culture .    Comprehensive metabolic panel    Collection Time: 07/12/14 11:17 PM   Result Value Ref Range    Sodium 139 133 - 145 mmol/L    Potassium 3.2 (L) 3.3 - 5.1 mmol/L    Chloride 99 96 - 108 mmol/L    CO2 22 20 - 28 mmol/L    Anion Gap 18 (H) 7 - 16    UN 8 6 - 20 mg/dL    Creatinine 0.72 0.67 - 1.17 mg/dL    GFR,Caucasian 113 *    GFR,Black 131 *    Glucose 258 (H) 60 - 99 mg/dL    Calcium 8.2 (L) 9.0 - 10.3 mg/dL    Total Protein 5.5 (L) 6.3 - 7.7 g/dL    Albumin 3.0 (L) 3.5 - 5.2 g/dL    Globulin 2.5 (L) 2.7 - 4.3 g/dL    Bilirubin,Total 0.6 0.0 - 1.2 mg/dL    AST 29 0 - 50 U/L    ALT 41 0 - 50 U/L    Alk Phos 39 (L) 40 - 130 U/L   Magnesium    Collection Time: 07/12/14 11:17 PM   Result Value Ref Range    Magnesium 1.4 1.3 - 2.1 mEq/L   Phosphorus    Collection Time: 07/12/14 11:17 PM   Result Value Ref Range    Phosphorus 1.7 (L) 2.7 - 4.5 mg/dL   CBC and differential    Collection Time: 07/12/14 11:17 PM   Result Value Ref Range    WBC 5.8 4.2 - 9.1 THOU/uL    RBC 4.1 (L) 4.6 - 6.1 MIL/uL    Hemoglobin 11.6 (L) 13.7 - 17.5 g/dL    Hematocrit 33 (L) 40 - 51 %    MCV 81 79 - 92 fL    MCH 29 26 - 32 pg    MCHC 35 32 - 37 g/dL    RDW 12.9 11.6 - 14.4 %    Platelets 143 (L) 150 - 330 THOU/uL    Seg Neut % 76.3 %    Lymphocyte % 8.3 %    Monocyte % 12.0 %    Eosinophil % 0.5 %    Basophil % 0.3 %    Neut # K/uL 4.4 1.8 - 5.4 THOU/uL    Lymph # K/uL 0.5 (L)  1.3 - 3.6 THOU/uL    Mono # K/uL 0.7 0.3 - 0.8 THOU/uL    Eos # K/uL 0.0 0.0 - 0.5 THOU/uL     Baso # K/uL 0.0 0.0 - 0.1 THOU/uL    Nucl RBC % 0.0 0.0 - 0.2 /100 WBC    Nucl RBC # K/uL 0.0 0.0 - 0.0 THOU/uL    IMM Granulocytes # 0.2 (H) 0.0 - 0.1 THOU/uL    IMM Granulocytes 2.6 %   Lactate, venous, whole blood    Collection Time: 07/12/14 11:17 PM   Result Value Ref Range    Lactate VEN,WB 1.4 0.5 - 2.2 mmol/L   C reactive protein    Collection Time: 07/12/14 11:17 PM   Result Value Ref Range    CRP 217 (H) 0 - 10 mg/L   Sedimentation rate, automated    Collection Time: 07/12/14 11:17 PM   Result Value Ref Range    Sedimentation Rate 78 (H) 0 - 15 mm/hr   NT-pro BNP    Collection Time: 07/12/14 11:17 PM   Result Value Ref Range    NT-pro BNP 1180 (H) 0 - 450 pg/mL   POCT glucose    Collection Time: 07/13/14  3:05 AM   Result Value Ref Range    Glucose POCT 244 (H) 60 - 99 mg/dL   POCT glucose    Collection Time: 07/13/14  8:13 AM   Result Value Ref Range    Glucose POCT 255 (H) 60 - 99 mg/dL   Vancomycin, random    Collection Time: 07/13/14  9:39 AM   Result Value Ref Range    Vancomycin Level 3.7 ug/mL   Basic metabolic panel    Collection Time: 07/13/14  9:39 AM   Result Value Ref Range    Glucose 239 (H) 60 - 99 mg/dL    Sodium 139 133 - 145 mmol/L    Potassium 3.5 3.3 - 5.1 mmol/L    Chloride 100 96 - 108 mmol/L    CO2 23 20 - 28 mmol/L    Anion Gap 16 7 - 16    UN 10 6 - 20 mg/dL    Creatinine 0.72 0.67 - 1.17 mg/dL    GFR,Caucasian 113 *    GFR,Black 131 *    Calcium 8.2 (L) 9.0 - 10.3 mg/dL   POCT glucose    Collection Time: 07/13/14 11:02 AM   Result Value Ref Range    Glucose POCT 229 (H) 60 - 99 mg/dL   TEE    Collection Time: 07/13/14  1:20 PM   Result Value Ref Range    BSA 2.28 m2    Height 72 in    BMI 30.7 kg/m2    Weight 3616 oz    Heart Rate 75 bpm    RR Interval 800.00 ms    ECG PR interval  ms    ECG QRS interval  ms    BP Systolic 161 mmHg    BP Diastolic 84 mmHg    BP Mean  mmHg    BP Paradox  mmHg    SAO4  %    FiO4  Fraction O2    Supplemental O2 rate  LPM    Arterial O2 content  ml/dl     Hemoglobin  mg/dl     UA / UC Urine Analysis  Specific Gravity UA: 1.012 (07/12/14 2300)  Blood Culture x2   BACTERIAL BLOOD CULTURE   Date Value Ref Range Status   07/12/2014 .  Preliminary   07/12/2014 .  Preliminary       Radiology Impressions (last 3 days):  * Portable Chest Standard Ap Single View    07/13/2014   IMPRESSION:    Findings concerning for worsened pulmonary edema. Superimposed  infection cannot be excluded.   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 There is no evidence for valvular dysfunction or abscess on his echocardiogram.    Plan:   1. 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 His plantar callouses seem unlikely to be the source, and he has no history of IV drug use.  -Resume vancomycin 2 g IV q12 hours  -F/u blood culture results  -Check HIV test  -ID to see    2. Uncontrolled DM 2  -Increase lantus to 15 units qhs  -SSI with meals  -Check HbA1c  -Diabetic diet    3. Hypoxia- differential includes fluid overload or septic emboli.  -Give lasix 20 mg IV x1  -Wean oxygen as tolerated    4. DVT prophylaxis  -Enoxaparin    D/w PA, ID, pt's wife    Author: Estelle June, MD  Note created: 07/13/2014  at: 2:53 PM

## 2014-07-13 NOTE — Interdisciplinary Rounds (Signed)
Interdisciplinary Rounds Note    Date: 07/13/2014   Time: 12:26 PM   Attendance:  Care Coordinator, Registered Nurse and Social Worker    Admit Date/Time:  07/12/2014  7:40 PM    Principal Problem: <principal problem not specified>  Problem List:   Patient Active Problem List    Diagnosis Date Noted    Bacteremia due to Staphylococcus aureus 07/12/2014       The patient's problem list and interdisciplinary care plan was reviewed.    Discharge Planning                 *Does patient currently have home care services?: No     *Current External Services: None                      Plan    Anticipated Discharge Date: TBD     Discharge Disposition: Home - pending TEE results, pt may benefit from home care referral.    Denny Peonnnmarie Weber, LMSW  07/13/2014  12:27 PM

## 2014-07-13 NOTE — Consults (Addendum)
Infectious Disease Initial Consult H&P    Consult requested by: Dr. Margy Clarks  Location of consult: [id number]     Reason for consult: Treatment of Staph aureus bacteremia.     HPI:  Dakota Mcfarland is a 43 yo M with a history of poorly controlled DM2 (HbA1c 12-13) who is transferred here from an OSH for further management of MRSA bacteremia and TEE. Dakota Mcfarland reports Dakota Mcfarland was of his normal state of health until the evening of Christmas, when Dakota Mcfarland noted acute onset of rigors, chills, nausea, and vomiting. Dakota Mcfarland reports his symptoms improved mildly, but soon thereafter Dakota Mcfarland again had severe diaphoresis, chills, rigors, nausea, vomiting, and diarrhea. No documented fevers at home. The 26th Dakota Mcfarland was quite ill with the above symptoms and Dakota Mcfarland and his wife presented to [facility] on 07/09/14 for further evaluation. Dakota Mcfarland notes that his above symptoms had worsened with worsening malaise, fatigue, and generalized weakness. Dakota Mcfarland presented to the OSH febrile and tachycardic with AGMA and ketonuria. His blood cultures were positive for staph that night and Dakota Mcfarland was started on vancomycin. Subsequent blood cultures (4/4) were noted to be positive as well. TTE was unremarkable. Dakota Mcfarland reports that his symptoms have gradually improved whilst on vancomycin and his fever curve has improved as well. However, Dakota Mcfarland has noted worsening dry hacking cough in addition to a new oxygen requirement over the past 2-3 days. Subjectively Dakota Mcfarland does not endorse dyspnea or wheezing.     Dakota Mcfarland has a history of foot calluses/ulcerations that were evaluated and debrided by podiatry at the OSH; per chart review there were no signs of cellulitis and an XR was negative for bone destruction/osteomyelitis. His primary complaints currently are his cough with associated musculoskeletal strain and diarrhea since starting antibiotics. However, overall Dakota Mcfarland feels that Dakota Mcfarland is getting his strength back. 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 No IVDU, Dakota Mcfarland does not inject insulin. Dakota Mcfarland has had no Dakota Mcfarland has not been on any other antibiotics in the past year. Dakota Mcfarland has no rashes or boils. No joint pain or swelling, back pain, headache, vision changes (has some 2/2 DM, but none recently), changes in urine, AP/N/V currently, light headedness, incoordination, or AMS (confirmed by wife). Dakota Mcfarland does not check his BGs at home and thus cannot report if they have been increasing over the past 1-2 weeks. At his baseline Dakota Mcfarland has trace LEE.    No Known Allergies (drug, envir, food or latex)   influenza Vac Split Quad  0.5 mL Intramuscular Prior to discharge -vaccine    enoxaparin  40 mg Subcutaneous Daily    insulin lispro  0-20 Units Subcutaneous TID WC    insulin glargine  10 Units Subcutaneous Nightly       Past Medical History   Diagnosis Date    Diabetes mellitus type 2, uncontrolled      History     Social History    Marital Status: Married     Spouse Name: N/A     Number of Children: N/A    Years of Education: N/A     Occupational History    Not on file.     Social History Main Topics    Smoking status: Never Smoker     Smokeless tobacco: Not on file    Alcohol Use: No    Drug  Use: No    Sexual Activity: Not on file     Other Topics Concern    Not on file     Social History Narrative     Travel History: Lives in [place], traveled here to [place] to have christmas with wife's family.  Occupation: Economist.    Family History   Problem Relation Age of Onset    Diabetes Mother     Diabetes Father      ROS: negative x 12 systems except that which was mentioned in the above HPI    Physical Exam:  BP: (122-132)/(80-90)   Temp:  [36.8 C (98.2 F)-37.1 C (98.8 F)]   Temp src:  [-]   Heart Rate:  [88-107]   Resp:  [20-22]   SpO2:  [87 %-95 %]   Height:  [182.9 cm (6')]   Weight:  [102.649 kg (226 lb 4.8 oz)]   Gen: Well  appearing man laying comfortably in bed. NAD, nontoxic.  HEENT: NCAT, MMM, anicteric sclerae.  Resp: Unlabored respirations. Lungs with decreased breath sounds at bases, but otherwise CTAB.  CV: RRR, normal S1S2. II/VI SEM at LSB that increases with inspiration. No rub/gallop.  Abd: Obese, soft, NT, ND, normoactive BS.  Ext: No osler nodes or janeway lesions. No clubbing. Trace LEE. No joint swelling, tenderness, or effusion.  Skin: No rashes noted. Warm and dry.  Neuro: AAOx3, moves extremities spontaneously and symmetrically. CNII-XII grossly intact.  Psych: Euthymic. Full affect. Appropriate      Recent Labs  Lab 07/12/14  2317   WBC 5.8   HEMOGLOBIN 11.6*   HEMATOCRIT 33*   PLATELETS 143*       No components found with this basename: NEUTOPHILPCT, MONOPCT,  EOSPCT    Recent Labs  Lab 07/12/14  2317   SODIUM 139   POTASSIUM 3.2*   CHLORIDE 99   CO2 22   ALT 41   AST 29       No components found with this basename: BUN, CREATININE, CALCIUM, LABALBU, PROT, BILITOT, ALKPHOS, GLUCOSE  Recent Results (from the past 24 hour(s))   POCT glucose    Collection Time: 07/12/14 10:13 PM   Result Value Ref Range    Glucose POCT 209 (H) 60 - 99 mg/dL   Clostridium difficile EIA    Collection Time: 07/12/14 11:00 PM   Result Value Ref Range    C difficile Toxins A&B EIA .    Urinalysis reflex to culture    Collection Time: 07/12/14 11:00 PM   Result Value Ref Range    Color, UA Lt Yellow (A) Yellow    Appearance,UR CLEAR Clear    Specific Gravity,UA 1.012 1.001 - 1.030    Leuk Esterase,UA NEG NEGATIVE    Nitrite,UA NEG NEGATIVE    pH,UA 7.0 5.0 - 8.0    Protein,UA 25 (A) NEGATIVE mg/dL    Glucose,UA 1000 (A) mg/dL    Ketones, UA 3+ (A) NEGATIVE    Blood,UA TRACE (A) NEGATIVE   Urine microscopic (iq200)    Collection Time: 07/12/14 11:00 PM   Result Value Ref Range    RBC,UA 0 - 2 0 - 2 /hpf    WBC,UA 0 - 5 0 - 5 /hpf   Blood culture    Collection Time: 07/12/14 11:16 PM   Result Value Ref Range    Bacterial Blood Culture .     Blood culture    Collection Time: 07/12/14 11:16 PM   Result Value Ref Range  Bacterial Blood Culture .    Comprehensive metabolic panel    Collection Time: 07/12/14 11:17 PM   Result Value Ref Range    Sodium 139 133 - 145 mmol/L    Potassium 3.2 (L) 3.3 - 5.1 mmol/L    Chloride 99 96 - 108 mmol/L    CO2 22 20 - 28 mmol/L    Anion Gap 18 (H) 7 - 16    UN 8 6 - 20 mg/dL    Creatinine 0.72 0.67 - 1.17 mg/dL    GFR,Caucasian 113 *    GFR,Black 131 *    Glucose 258 (H) 60 - 99 mg/dL    Calcium 8.2 (L) 9.0 - 10.3 mg/dL    Total Protein 5.5 (L) 6.3 - 7.7 g/dL    Albumin 3.0 (L) 3.5 - 5.2 g/dL    Globulin 2.5 (L) 2.7 - 4.3 g/dL    Bilirubin,Total 0.6 0.0 - 1.2 mg/dL    AST 29 0 - 50 U/L    ALT 41 0 - 50 U/L    Alk Phos 39 (L) 40 - 130 U/L   Magnesium    Collection Time: 07/12/14 11:17 PM   Result Value Ref Range    Magnesium 1.4 1.3 - 2.1 mEq/L   Phosphorus    Collection Time: 07/12/14 11:17 PM   Result Value Ref Range    Phosphorus 1.7 (L) 2.7 - 4.5 mg/dL   CBC and differential    Collection Time: 07/12/14 11:17 PM   Result Value Ref Range    WBC 5.8 4.2 - 9.1 THOU/uL    RBC 4.1 (L) 4.6 - 6.1 MIL/uL    Hemoglobin 11.6 (L) 13.7 - 17.5 g/dL    Hematocrit 33 (L) 40 - 51 %    MCV 81 79 - 92 fL    MCH 29 26 - 32 pg    MCHC 35 32 - 37 g/dL    RDW 12.9 11.6 - 14.4 %    Platelets 143 (L) 150 - 330 THOU/uL    Seg Neut % 76.3 %    Lymphocyte % 8.3 %    Monocyte % 12.0 %    Eosinophil % 0.5 %    Basophil % 0.3 %    Neut # K/uL 4.4 1.8 - 5.4 THOU/uL    Lymph # K/uL 0.5 (L) 1.3 - 3.6 THOU/uL    Mono # K/uL 0.7 0.3 - 0.8 THOU/uL    Eos # K/uL 0.0 0.0 - 0.5 THOU/uL    Baso # K/uL 0.0 0.0 - 0.1 THOU/uL    Nucl RBC % 0.0 0.0 - 0.2 /100 WBC    Nucl RBC # K/uL 0.0 0.0 - 0.0 THOU/uL    IMM Granulocytes # 0.2 (H) 0.0 - 0.1 THOU/uL    IMM Granulocytes 2.6 %   Lactate, venous, whole blood    Collection Time: 07/12/14 11:17 PM   Result Value Ref Range    Lactate VEN,WB 1.4 0.5 - 2.2 mmol/L   C reactive protein    Collection Time: 07/12/14  11:17 PM   Result Value Ref Range    CRP 217 (H) 0 - 10 mg/L   Sedimentation rate, automated    Collection Time: 07/12/14 11:17 PM   Result Value Ref Range    Sedimentation Rate 78 (H) 0 - 15 mm/hr   NT-pro BNP    Collection Time: 07/12/14 11:17 PM   Result Value Ref Range    NT-pro BNP 1180 (H) 0 - 450  pg/mL   POCT glucose    Collection Time: 07/13/14  3:05 AM   Result Value Ref Range    Glucose POCT 244 (H) 60 - 99 mg/dL   POCT glucose    Collection Time: 07/13/14  8:13 AM   Result Value Ref Range    Glucose POCT 255 (H) 60 - 99 mg/dL     Imaging:  TEE (07/13/14):   1) Normal biventricular size and systolic function  2) LVEF is approximately 65%  3) Mass that appears to be tethered to anterior tricuspid leaflet concerning for vegetation  4) Mild functional valve disease  5) Pleural effusion    Micro:  OSH cultures reportedly 4/4 positive for MRSA  MRSA with calculated MIC to vancomycin of 2    Impression/Recommendations:  Dakota Mcfarland is a 43 yo M with a history of poorly 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 It is hard to say what his source of MRSA is (picking vs incidental skin injury), though his relative immunosuppression from DM2 likely played a role. His relative hypoxia and worsening cough is concerning for staph embolism to the lungs and developing pneumonia, though could also be due to pulmonary edema/effusion from aggressive fluid resuscitation. At this time, no other signs on exam or symptoms of septic emboli. The relatively high MIC to vancomycin that his MRSA strain has is concerning; we have asked our microbiology lab to obtain the sample and obtain manual MICs to vancomycin, daptomycin, and linezolid.     - Please continue IV vancomycin for at least 6 weeks from first negative blood culture. Given current concern for bacterial embolization to the lungs, would target a trough of 15-20.   - Will follow up the MRSA MIC to vancomycin. If it is truly elevated, we will likely  recommend transitioning to daptomycin; if further signs of MRSA pneumonia then we may have to add linezolid as well.   - Recommend obtaining a formal PA and lateral CXR to evaluate lungs.   - Recommend obtaining clean catch UA to evaluate for microscopic hematuria    - We will follow along with you.    Anda Latina, MD  Internal Medicine and Pediatrics R2  4:30 PM  07/13/2014    I've interviewed and examined the patient and agree with the above resident infectious disease note which I reviewed and confirmed.    Dakota Mcfarland is a 43 year old man with a history of poorly controlled diabetes types 2.  On 12/25 in the evening Dakota Mcfarland was driving from [place] to his wife's and family's home in [place] when Dakota Mcfarland felt fever and malaise.  Dakota Mcfarland then developed nausea, emesis, weakness, confusion, and shaking chills.  On 12/27 his condition worsened and Dakota Mcfarland went to [facility] brace on had diabetes out of control and 4 out of 4 blood culture bottles positive for MRSA.  Transthoracic echocardiogram was negative and Dakota Mcfarland was transferred here for further evaluation.  Dakota Mcfarland was treated with vancomycin 1500 mg every 12.  TEE earlier today shows potential vegetation the tricuspid valve.    Currently Dakota Mcfarland feels significantly better with increased strength, decreased malaise and confusion.  No fevers or rigors.  Better appetite.  Dakota Mcfarland denies any other symptoms such as prolonged illness, weakness or change in blood sugar levels although Dakota Mcfarland does not routinely test his blood sugar.    Past medical history, medications, family history, social history, review of systems and allergies are as above which I reviewed and confirmed.  On exam Dakota Mcfarland is awake alert no acute distress, sclera and conjunctiva are clear, oropharynx is clear, neck is supple, lungs are clear, regular rate and rhythm with normal S1-S2 and no murmur, abdomen is soft and nontender, skin is without rash, there is no cervical or axillary lymphadenopathy, extremities have  no splinter hemorrhages or other stigmata of endocarditis.  Dakota Mcfarland has sites with callus were removed both feet which appear to be healing well and a nonpurulent.  The callus removal was done after admission to the hospital.  Creatinine 0.72, and glucose 239,      Assessment and plan:  MRSA tricuspid valve endocarditis native valve.  Unclear source origin.  Continue vancomycin and adjust dose for trough of 15-20.  Anticipate 6 weeks IV therapy.  I have asked the microbiology lab to obtain a sample) and run a MIC to vancomycin and daptomycin as well as linezolid.  Would like to see a PA and Lat CXR.   Would delay PICC placement until blood cultures can be confirmed negative.

## 2014-07-13 NOTE — Plan of Care (Signed)
Cognitive function    • Cognitive function will be maintained or return to baseline Maintaining        Impaired Gas Exchange    • Patient will maintain adequate oxygenation Maintaining        Ineffective Airway Clearance (Aspiration Precautions)    • Patient will maintain patent airway Maintaining        Ineffective Breathing Pattern    • Patient will achieve/maintain normal respiratory rate/effort Maintaining        Mobility    • Patient's functional status is maintained or improved Maintaining        Nutrition    • Patient's nutritional status is maintained or improved Maintaining        Pain/Comfort    • Patient's pain or discomfort is manageable Maintaining        Potential Risk for infection    • Pt will be free of infection Maintaining        Psychosocial    • Demonstrates ability to cope with illness Maintaining        Safety    • Patient will remain free of falls Maintaining    • Prevent any intentional injury Maintaining

## 2014-07-13 NOTE — Progress Notes (Signed)
W5 Admission Skin Assessment  Admission Braden: 22  Current Braden: 22  Q2 hour turns: NI  Foley catheter: NI  Stool management device: NI  Mattress: Standard  See below image for full skin assessment.          .Marland Kitchen

## 2014-07-13 NOTE — Procedures (Signed)
TEE PROCEDURE NOTE    Patient was prepped and sedated in the usual manner. Adequate sedation was obtained with 6 mg of Versed and 125 mcg of Fentanyl with good results. The probe was advanced through the mouth, into the esophagus and finally into the stomach without difficulty and with diagnostic imaging obtained. There was no significant blood loss and the patient tolerated the procedure well. Full report to follow of procedure findings but briefly there was:   1) Normal biventricular size and systolic function   2) LVEF is approximately 65%   3) Mass that appears to be tethered to anterior tricuspid leaflet concerning for vegetation   4) Mild functional valve disease   5) Pleural effusion      Theressa StampsANGELO Shimeka Bacot, MD

## 2014-07-14 LAB — CBC AND DIFFERENTIAL
Baso # K/uL: 0 10*3/uL (ref 0.0–0.1)
Basophil %: 0 %
Eos # K/uL: 0.1 10*3/uL (ref 0.0–0.5)
Eosinophil %: 1 %
Hematocrit: 32 % — ABNORMAL LOW (ref 40–51)
Hemoglobin: 10.9 g/dL — ABNORMAL LOW (ref 13.7–17.5)
Lymph # K/uL: 0.7 10*3/uL — ABNORMAL LOW (ref 1.3–3.6)
Lymphocyte %: 11 %
MCH: 29 pg (ref 26–32)
MCHC: 35 g/dL (ref 32–37)
MCV: 83 fL (ref 79–92)
Mono # K/uL: 0.6 10*3/uL (ref 0.3–0.8)
Monocyte %: 10 %
Neut # K/uL: 4.6 10*3/uL (ref 1.8–5.4)
Nucl RBC # K/uL: 0 10*3/uL (ref 0.0–0.0)
Nucl RBC %: 0 /100 WBC (ref 0.0–0.2)
Platelets: 189 10*3/uL (ref 150–330)
RBC: 3.8 MIL/uL — ABNORMAL LOW (ref 4.6–6.1)
RDW: 13.2 % (ref 11.6–14.4)
Seg Neut %: 75 %
WBC: 6 10*3/uL (ref 4.2–9.1)

## 2014-07-14 LAB — DIFF MANUAL
Bands %: 1 % (ref 0–10)
Diff Based On: 100 CELLS
Metamyelocyte %: 1 % (ref 0–1)
Myelocyte %: 1 % — ABNORMAL HIGH (ref 0–0)

## 2014-07-14 LAB — BASIC METABOLIC PANEL
Anion Gap: 14 (ref 7–16)
CO2: 26 mmol/L (ref 20–28)
Calcium: 8 mg/dL — ABNORMAL LOW (ref 9.0–10.3)
Chloride: 99 mmol/L (ref 96–108)
Creatinine: 0.74 mg/dL (ref 0.67–1.17)
GFR,Black: 130 *
GFR,Caucasian: 112 *
Glucose: 302 mg/dL — ABNORMAL HIGH (ref 60–99)
Lab: 11 mg/dL (ref 6–20)
Potassium: 3.7 mmol/L (ref 3.3–5.1)
Sodium: 139 mmol/L (ref 133–145)

## 2014-07-14 LAB — POCT GLUCOSE
Glucose POCT: 298 mg/dL — ABNORMAL HIGH (ref 60–99)
Glucose POCT: 286 mg/dL — ABNORMAL HIGH (ref 60–99)
Glucose POCT: 304 mg/dL — ABNORMAL HIGH (ref 60–99)
Glucose POCT: 322 mg/dL — ABNORMAL HIGH (ref 60–99)
Glucose POCT: 359 mg/dL — ABNORMAL HIGH (ref 60–99)

## 2014-07-14 LAB — HIV 1&2 ANTIGEN/ANTIBODY: HIV 1&2 ANTIGEN/ANTIBODY: NONREACTIVE

## 2014-07-14 MED ORDER — INSULIN LISPRO (HUMAN) 100 UNIT/ML IJ/SC SOLN *WRAPPED*
0.0000 [IU] | Freq: Three times a day (TID) | SUBCUTANEOUS | Status: DC
Start: 2014-07-14 — End: 2014-07-15
  Administered 2014-07-14 (×2): 8 [IU] via SUBCUTANEOUS
  Administered 2014-07-15: 7 [IU] via SUBCUTANEOUS

## 2014-07-14 MED ORDER — INSULIN GLARGINE 100 UNIT/ML SC SOLN *WRAPPED*
20.0000 [IU] | Freq: Every evening | SUBCUTANEOUS | Status: DC
Start: 2014-07-14 — End: 2014-07-15
  Administered 2014-07-14: 20 [IU] via SUBCUTANEOUS

## 2014-07-14 MED ORDER — FUROSEMIDE 10 MG/ML IJ SOLN *I*
20.0000 mg | Freq: Once | INTRAMUSCULAR | Status: AC
Start: 2014-07-14 — End: 2014-07-14
  Administered 2014-07-14: 20 mg via INTRAVENOUS
  Filled 2014-07-14: qty 2

## 2014-07-14 NOTE — Progress Notes (Signed)
Hospitalist Progress Note    Interval History:  He is feeling better each day. Has a dry cough and some sharp intermittent right-sided chest pain. No fevers or chills.    Intake/Output    Intake/Output Summary (Last 24 hours) at 07/14/14 0915  Last data filed at 07/14/14 0345   Gross per 24 hour   Intake   1750 ml   Output      2 ml   Net   1748 ml        Vital Signs:   Temp:  [36 C (96.8 F)-37.5 C (99.5 F)] 36.5 C (97.7 F)  Heart Rate:  [84-107] 103  Resp:  [16-25] 20  BP: (100-150)/(61-82) 144/76 mmHg       Physical Exam:    General: NAD  HEENT: MMM, OP clear, NC in place  Cardiovascular: RRR, S1/S2, no m/r/g  Pulmonary: Left base crackles, otherwise clear  Gastrointestinal: +BS, soft, NT, ND  Extr: No edema    Data:      Recent Labs  Lab 07/14/14  0413 07/12/14  2317   WBC 6.0 5.8   HEMOGLOBIN 10.9* 11.6*   HEMATOCRIT 32* 33*   PLATELETS 189 143*     Other Labs:      Recent Labs  Lab 07/14/14  0413 07/13/14  0939 07/12/14  2317   SODIUM 139 139 139   POTASSIUM 3.7 3.5 3.2*   CHLORIDE 99 100 99   CO2 UN CREATININE 0.74 0.72 0.72   GFR,CAUCASIAN 112 113 113   GFR,BLACK 130 131 131   GLUCOSE 302* 239* 258*   CALCIUM 8.0* 8.2* 8.2*      X-rays the past 24 hours: *chest Standard Frontal And Lateral Views    07/13/2014   IMPRESSION:     1. Findings concerning for early pneumonia in the left lower lobe. 2. Hypoventilatory lungs. Increased vascularity again noted which may  indicate mild edema or maybe exaggerated by hypoventilation.   END OF REPORT    * Portable Chest Standard Ap Single View    07/13/2014   IMPRESSION:    Findings concerning for worsened pulmonary edema. Superimposed  infection cannot be excluded.   END OF REPORT       Echo:  Summary   1. Findings consistent with tricuspid valve vegetation (see below).  2. Normal biventricular size and function.  3. Estimated left ventricular ejection fraction is 65%.  4. Mild tricuspid valve regurgitation.   5. No prior  study available for comparison.    Assessment/Plan  Active Hospital Problems    Diagnosis    Endocarditis    Bacteremia due to Staphylococcus aureus      Resolved Hospital Problems    Diagnosis   No resolved problems to display.     1. Tricuspid valve endocarditis with MRSA bacteremia- unclear source of infection. Repeat cultures here (1/4 bottles) are again growing Staph. His CXR and symptoms suggest possible septic emboli to the lungs.  -Vancomycin- goal trough 15-20. Likely will need 6 weeks of IV therapy.  -May also need linezolid given CXR findings  -F/u blood culture results  -PICC line once his bacteremia has cleared  -HIV test negative  -ID following    2. Uncontrolled DM 2  -Increase lantus to 20 units qhs  -SSI, add baseline 3 units  -HbA1c pending  -Diabetic diet    3. Hypoxia- ?fluid overload vs septic emboli.  -Repeat lasix 20 mg IV  x1  -Oxygen weaned to 2 liters    4. DVT prophylaxis  -Enoxaparin    DC Planning  Likely sometime next week    Larey Days, MD   07/14/2014   9:15 AM

## 2014-07-14 NOTE — Plan of Care (Signed)
Cognitive function    • Cognitive function will be maintained or return to baseline Maintaining        Impaired Gas Exchange    • Patient will maintain adequate oxygenation Maintaining        Ineffective Airway Clearance (Aspiration Precautions)    • Patient will maintain patent airway Maintaining        Ineffective Breathing Pattern    • Patient will achieve/maintain normal respiratory rate/effort Maintaining        Mobility    • Patient's functional status is maintained or improved Maintaining        Nutrition    • Patient's nutritional status is maintained or improved Maintaining        Pain/Comfort    • Patient's pain or discomfort is manageable Maintaining        Potential Risk for infection    • Pt will be free of infection Maintaining        Psychosocial    • Demonstrates ability to cope with illness Maintaining        Safety    • Patient will remain free of falls Maintaining    • Prevent any intentional injury Maintaining

## 2014-07-15 ENCOUNTER — Ambulatory Visit
Admit: 2014-07-15 | Discharge: 2014-07-15 | Disposition: A | Discharge status: Home / Self Care | Payer: Self-pay | Source: Ambulatory Visit | Attending: Infectious Diseases | Admitting: Infectious Diseases

## 2014-07-15 LAB — BASIC METABOLIC PANEL
Anion Gap: 13 (ref 7–16)
CO2: 28 mmol/L (ref 20–28)
Calcium: 8.4 mg/dL — ABNORMAL LOW (ref 9.0–10.3)
Chloride: 100 mmol/L (ref 96–108)
Creatinine: 0.8 mg/dL (ref 0.67–1.17)
GFR,Black: 126 *
GFR,Caucasian: 109 *
Glucose: 299 mg/dL — ABNORMAL HIGH (ref 60–99)
Lab: 10 mg/dL (ref 6–20)
Potassium: 3.3 mmol/L (ref 3.3–5.1)
Sodium: 141 mmol/L (ref 133–145)

## 2014-07-15 LAB — CBC AND DIFFERENTIAL
Baso # K/uL: 0 10*3/uL (ref 0.0–0.1)
Basophil %: 0 %
Eos # K/uL: 0.3 10*3/uL (ref 0.0–0.5)
Eosinophil %: 6 %
Hematocrit: 33 % — ABNORMAL LOW (ref 40–51)
Hemoglobin: 11.2 g/dL — ABNORMAL LOW (ref 13.7–17.5)
Lymph # K/uL: 0.7 10*3/uL — ABNORMAL LOW (ref 1.3–3.6)
Lymphocyte %: 12 %
MCH: 28 pg (ref 26–32)
MCHC: 34 g/dL (ref 32–37)
MCV: 83 fL (ref 79–92)
Mono # K/uL: 0.5 10*3/uL (ref 0.3–0.8)
Monocyte %: 9 %
Neut # K/uL: 3.9 10*3/uL (ref 1.8–5.4)
Nucl RBC # K/uL: 0 10*3/uL (ref 0.0–0.0)
Nucl RBC %: 0 /100 WBC (ref 0.0–0.2)
Platelets: 225 10*3/uL (ref 150–330)
RBC: 4 MIL/uL — ABNORMAL LOW (ref 4.6–6.1)
RDW: 12.9 % (ref 11.6–14.4)
Seg Neut %: 68 %
WBC: 5.6 10*3/uL (ref 4.2–9.1)

## 2014-07-15 LAB — RBC MORPHOLOGY: RBC Morphology: NORMAL

## 2014-07-15 LAB — DIFF MANUAL
Bands %: 1 % (ref 0–10)
Diff Based On: 100 CELLS
Metamyelocyte %: 3 % — ABNORMAL HIGH (ref 0–1)
React Lymph %: 1 % (ref 0.0–6.0)

## 2014-07-15 LAB — POCT GLUCOSE
Glucose POCT: 294 mg/dL — ABNORMAL HIGH (ref 60–99)
Glucose POCT: 299 mg/dL — ABNORMAL HIGH (ref 60–99)
Glucose POCT: 317 mg/dL — ABNORMAL HIGH (ref 60–99)
Glucose POCT: 285 mg/dL — ABNORMAL HIGH (ref 60–99)

## 2014-07-15 LAB — VANCOMYCIN, TROUGH: Vancomycin Trough: 11.9 ug/mL (ref 10.0–20.0)

## 2014-07-15 LAB — HEMOGLOBIN A1C: Hemoglobin A1C: 11.5 % — ABNORMAL HIGH (ref 4.0–6.0)

## 2014-07-15 MED ORDER — INSULIN LISPRO (HUMAN) 100 UNIT/ML IJ/SC SOLN *WRAPPED*
0.0000 [IU] | Freq: Three times a day (TID) | SUBCUTANEOUS | Status: DC
Start: 2014-07-15 — End: 2014-07-16
  Administered 2014-07-15: 10 [IU] via SUBCUTANEOUS
  Administered 2014-07-15 – 2014-07-16 (×2): 11 [IU] via SUBCUTANEOUS

## 2014-07-15 MED ORDER — VANCOMYCIN HCL IN D5W 1500 MG/250 ML IV SOLN *I*
1500.0000 mg | Freq: Three times a day (TID) | INTRAVENOUS | Status: DC
Start: 2014-07-15 — End: 2014-07-17
  Administered 2014-07-15 – 2014-07-17 (×8): 1500 mg via INTRAVENOUS
  Filled 2014-07-15 (×11): qty 305

## 2014-07-15 MED ORDER — INSULIN GLARGINE 100 UNIT/ML SC SOLN *WRAPPED*
23.0000 [IU] | Freq: Every evening | SUBCUTANEOUS | Status: DC
Start: 2014-07-15 — End: 2014-07-16
  Administered 2014-07-15: 23 [IU] via SUBCUTANEOUS

## 2014-07-15 MED ORDER — IBUPROFEN 600 MG PO TABS *I*
600.0000 mg | ORAL_TABLET | Freq: Four times a day (QID) | ORAL | Status: AC | PRN
Start: 2014-07-15 — End: 2014-07-17
  Administered 2014-07-15 – 2014-07-17 (×3): 600 mg via ORAL
  Filled 2014-07-15 (×3): qty 1

## 2014-07-15 MED ORDER — POTASSIUM CHLORIDE CRYS CR 20 MEQ PO TBCR *I*
40.0000 meq | ORAL_TABLET | Freq: Once | ORAL | Status: AC
Start: 2014-07-15 — End: 2014-07-15
  Administered 2014-07-15: 40 meq via ORAL
  Filled 2014-07-15: qty 2

## 2014-07-15 NOTE — Progress Notes (Signed)
Hospitalist Progress Note    Interval History:  He feels much better. Still has intermittent dry cough. Weaned off of oxygen. No fevers/chills. No pain or drainage from his foot callouses.    Intake/Output    Intake/Output Summary (Last 24 hours) at 07/15/14 0935  Last data filed at 07/14/14 2355   Gross per 24 hour   Intake     30 ml   Output      0 ml   Net     30 ml        Vital Signs:   Temp:  [36.3 C (97.3 F)-36.9 C (98.4 F)] 36.5 C (97.7 F)  Heart Rate:  [85-93] 85  Resp:  [15-18] 16  BP: (116-150)/(68-86) 140/68 mmHg       Physical Exam:    General: NAD, dry cough  Cardiovascular: RRR, S1/S2, faint systolic murmur at LSB  Pulmonary: Decreased breath sounds at the bases  Gastrointestinal: +BS, soft, NT, ND  Extr: No edema. Bilateral 1st MTP callouses, no drainage, not tender.    Data:      Recent Labs  Lab 07/15/14  0213 07/14/14  0413 07/12/14  2317   WBC 5.6 6.0 5.8   HEMOGLOBIN 11.2* 10.9* 11.6*   HEMATOCRIT 33* 32* 33*   PLATELETS 225 189 143*     Other Labs:      Recent Labs  Lab 07/15/14  0213 07/14/14  0413 07/13/14  0939   SODIUM 141 139 139   POTASSIUM 3.3 3.7 3.5   CHLORIDE 100 99 100   CO2 UN CREATININE 0.80 0.74 0.72   GFR,CAUCASIAN 109 112 113   GFR,BLACK 126 130 131   GLUCOSE 299* 302* 239*   CALCIUM 8.4* 8.0* 8.2*      X-rays the past 24 hours: *chest Standard Frontal And Lateral Views    07/13/2014   IMPRESSION:     1. Findings concerning for early pneumonia in the left lower lobe. 2. Hypoventilatory lungs. Increased vascularity again noted which may  indicate mild edema or maybe exaggerated by hypoventilation.   END OF REPORT    * Portable Chest Standard Ap Single View    07/13/2014   IMPRESSION:    Findings concerning for worsened pulmonary edema. Superimposed  infection cannot be excluded.   END OF REPORT       Assessment/Plan  Active Hospital Problems    Diagnosis    Endocarditis    Bacteremia due to Staphylococcus aureus      Resolved  Hospital Problems    Diagnosis   No resolved problems to display.     1. Tricuspid valve endocarditis with MRSA bacteremia- unclear source of infection, although could potentially be his foot callouses. Blood cultures here remain positive although he is clinically improving.  -Vancomycin dose adjusted overnight. Likely will need 6 weeks of IV therapy.  -Repeat blood cultures today  -PICC line once his bacteremia has cleared  -ID following    2. Uncontrolled DM 2- HbA1c was 11.5.  -Increase lantus to 23 units qhs  -SSI, increase baseline to 6 units  -Diabetic diet    3. Hypoxia- resolved after 2nd dose of lasix.    4. Hypokalemia  -Replete orally    5. DVT prophylaxis  -Enoxaparin    6. HTN- his BP is modestly elevated. Will monitor, as he may need treatment.    DC Planning  Likely d/c next week    Larey Days,  MD   07/15/2014   9:35 AM

## 2014-07-15 NOTE — Progress Notes (Signed)
07/15/2014  Infectious Disease Progress Note     Antibitoic status:   Current Antibiotics   Medication Dose Frequency Extra Info        Subjective: He is feeling much better and has no complaints today    Objective: Awake and alert no acute distress  Temp:  [36.3 C (97.3 F)-36.9 C (98.4 F)] 36.3 C (97.3 F)  Heart Rate:  [85-93] 88  Resp:  [15-18] 16  BP: (138-150)/(68-86) 138/74 mmHg  Sclera and conjunctiva clear, lungs with good air movement but some left basilar crackles.  Regular rate and rhythm with normal S1-S2 and no murmur, abdomen soft, IV sites clean, no splinter hemorrhage or stigmata of endocarditis.    Lab & Micro: Blood cultures from 12/31 are positive for gram-positive cocci in both anaerobic and aerobic bottle.  Initial PCR was negative for staph aureus.  Chest x-ray with some left lower lobe consolidation.  Assessment/Plan:   Continue IV vancomycin for an anticipated 6 week course.  I have asked our microbiology lab to obtain the specimen from [facility] and check vancomycin, daptomycin, and linezolid MIC.  The results from this will need to be communicated to the infectious disease specialist who follows him in [place].  I agree with delay of PICC placement due to positive blood cultures from 12/31.  Although the initial PCR is negative for staph aureus both bottles were positive and we should wait identification. Repeat blood cultures today are pending.  We'll continue to adjust vancomycin dose to obtain trough of 15-20.  Will need weekly CBC with differential, BMP and vancomycin trough.    Hyman Hopes, MD  07/15/2014 12:04 PM

## 2014-07-15 NOTE — Plan of Care (Signed)
Cognitive function    • Cognitive function will be maintained or return to baseline Maintaining        Impaired Gas Exchange    • Patient will maintain adequate oxygenation Maintaining        Ineffective Airway Clearance (Aspiration Precautions)    • Patient will maintain patent airway Maintaining        Ineffective Breathing Pattern    • Patient will achieve/maintain normal respiratory rate/effort Maintaining        Mobility    • Patient's functional status is maintained or improved Maintaining        Nutrition    • Patient's nutritional status is maintained or improved Maintaining        Pain/Comfort    • Patient's pain or discomfort is manageable Maintaining        Psychosocial    • Demonstrates ability to cope with illness Maintaining        Safety    • Patient will remain free of falls Maintaining    • Prevent any intentional injury Maintaining          Potential Risk for infection    • Pt will be free of infection Progressing towards goal

## 2014-07-15 NOTE — Progress Notes (Signed)
Pharmacist Pharmacokinetic Note - Vancomycin    General  Patient is receiving vancomycin for bacteremia.Current vancomycin regimen: 2 gram every 12 hours    Today is day 3 of therapy.  Goal vancomycin trough concentration:  15-20 mcg/mL.    Laboratory Data      Lab results: 07/15/14  0213 07/14/14  0413 07/12/14  2317   WBC 5.6 6.0 5.8         Lab results: 07/15/14  0213 07/14/14  0413 07/13/14  0939   CREATININE 0.80 0.74 0.72         Lab results: 07/15/14  0213 07/14/14  0413 07/13/14  0939   UN Vancomycin concentrations:        Lab results: 07/15/14  0213 07/13/14  0939   VANCOMYCIN TROUGH 11.9  --    VANCOMYCIN LEVEL  --  3.7          Assessment  Reported vancomycin concentration is below therapeutic range.    Recommendation  Based on calculations, patient's renal function, volume status, and reported concentrations, recommend the following dose: 1500 mg IV every 8 hours.     Recommend next vancomycin trough concentration to be drawn on 07/16/13 before 0400 dose.    Discussed plan with provider Donnie Coffin PA.   For questions contact pharmacy at extension [phone]    Drenda Freeze PharmD

## 2014-07-16 LAB — BLOOD CULTURE

## 2014-07-16 LAB — POCT GLUCOSE
Glucose POCT: 286 mg/dL — ABNORMAL HIGH (ref 60–99)
Glucose POCT: 304 mg/dL — ABNORMAL HIGH (ref 60–99)
Glucose POCT: 231 mg/dL — ABNORMAL HIGH (ref 60–99)
Glucose POCT: 306 mg/dL — ABNORMAL HIGH (ref 60–99)
Glucose POCT: 331 mg/dL — ABNORMAL HIGH (ref 60–99)

## 2014-07-16 LAB — VANCOMYCIN, TROUGH: Vancomycin Trough: 18 ug/mL (ref 10.0–20.0)

## 2014-07-16 MED ORDER — INSULIN GLARGINE 100 UNIT/ML SC SOLN *WRAPPED*
27.0000 [IU] | Freq: Every evening | SUBCUTANEOUS | Status: DC
Start: 2014-07-16 — End: 2014-07-17
  Administered 2014-07-16: 27 [IU] via SUBCUTANEOUS

## 2014-07-16 MED ORDER — INSULIN LISPRO (HUMAN) 100 UNIT/ML IJ/SC SOLN *WRAPPED*
0.0000 [IU] | Freq: Three times a day (TID) | SUBCUTANEOUS | Status: DC
Start: 2014-07-16 — End: 2014-07-17
  Administered 2014-07-16: 12 [IU] via SUBCUTANEOUS
  Administered 2014-07-16 – 2014-07-17 (×2): 14 [IU] via SUBCUTANEOUS
  Administered 2014-07-17: 13 [IU] via SUBCUTANEOUS

## 2014-07-16 NOTE — Progress Notes (Addendum)
Hospitalist Progress Note    Interval History:  He has some low back pain, which he believes was caused by a strain from coughing. Otherwise he is feeling well and has no complaints.    Intake/Output    Intake/Output Summary (Last 24 hours) at 07/16/14 1052  Last data filed at 07/16/14 0349   Gross per 24 hour   Intake    675 ml   Output      0 ml   Net    675 ml        Vital Signs:   Temp:  [36.2 C (97.2 F)-37 C (98.6 F)] 36.2 C (97.2 F)  Heart Rate:  [84-88] 84  Resp:  [16] 16  BP: (132-140)/(72-80) 132/72 mmHg       Physical Exam:    General: NAD  Cardiovascular: RRR, S1/S2  Pulmonary: CTAB  Gastrointestinal: +BS, soft, NT  Extr: No edema    Data:      Recent Labs  Lab 07/15/14  0213 07/14/14  0413 07/12/14  2317   WBC 5.6 6.0 5.8   HEMOGLOBIN 11.2* 10.9* 11.6*   HEMATOCRIT 33* 32* 33*   PLATELETS 225 189 143*     Other Labs:      Recent Labs  Lab 07/15/14  0213 07/14/14  0413 07/13/14  0939   SODIUM 141 139 139   POTASSIUM 3.3 3.7 3.5   CHLORIDE 100 99 100   CO2 UN CREATININE 0.80 0.74 0.72   GFR,CAUCASIAN 109 112 113   GFR,BLACK 126 130 131   GLUCOSE 299* 302* 239*   CALCIUM 8.4* 8.0* 8.2*      X-rays the past 24 hours: *chest Standard Frontal And Lateral Views    07/13/2014   IMPRESSION:     1. Findings concerning for early pneumonia in the left lower lobe. 2. Hypoventilatory lungs. Increased vascularity again noted which may  indicate mild edema or maybe exaggerated by hypoventilation.   END OF REPORT       Assessment/Plan  Active Hospital Problems    Diagnosis    Endocarditis    Bacteremia due to Staphylococcus aureus      Resolved Hospital Problems    Diagnosis   No resolved problems to display.     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 Trough overnight was appropriate.  -F/u blood cultures. If negative by tomorrow, hopefully can place PICC line.  -ID following  -Will need to coordinate outpatient care with his  PCP    2. Uncontrolled DM 2  -Increase lantus to 27 units qhs  -SSI, increase baseline to 9 units  -Start diabetes/insulin teaching    3. DVT prophylaxis  -Enoxaparin    4. HTN- his BP is borderline elevated. May need to start treatment.    DC Planning  Likely d/c this coming week  Spoke with his wife on the phone, all questions answered    Larey Days, MD   07/16/2014   10:52 AM

## 2014-07-16 NOTE — Progress Notes (Signed)
Pharmacist Pharmacokinetic Note - Vancomycin    General  Patient is receiving vancomycin for bacteremia     Current vancomycin regimen 1500 mg every 8 hours    Today is day 4 of therapy.      Goal vancomycin trough concentration:  15-20 mcg/mL.    Laboratory Data      Lab results: 07/15/14  0213 07/14/14  0413 07/12/14  2317   WBC 5.6 6.0 5.8         Lab results: 07/15/14  0213 07/14/14  0413 07/13/14  0939   CREATININE 0.80 0.74 0.72         Lab results: 07/15/14  0213 07/14/14  0413 07/13/14  0939   UN Vancomycin concentrations:        Lab results: 07/16/14  0254 07/15/14  0213 07/13/14  0939   VANCOMYCIN TROUGH 18.0 11.9  --    VANCOMYCIN LEVEL  --   --  3.7          Assessment  Reported vancomycin concentration is within therapeutic range    Recommendation  Based on calculations, patient's renal function, volume status, and reported concentrations, recommend to continue vancomycin  1500 mg IV every 8 hours.     Recommend next vancomycin trough concentration to be drawn in 5-7 days.     For questions contact pharmacy at extension [phone]      Drenda Freeze [phone]

## 2014-07-16 NOTE — Plan of Care (Signed)
Cognitive function    • Cognitive function will be maintained or return to baseline Maintaining        Impaired Gas Exchange    • Patient will maintain adequate oxygenation Maintaining        Ineffective Airway Clearance (Aspiration Precautions)    • Patient will maintain patent airway Maintaining        Ineffective Breathing Pattern    • Patient will achieve/maintain normal respiratory rate/effort Maintaining        Mobility    • Patient's functional status is maintained or improved Maintaining        Nutrition    • Patient's nutritional status is maintained or improved Maintaining        Pain/Comfort    • Patient's pain or discomfort is manageable Maintaining        Psychosocial    • Demonstrates ability to cope with illness Maintaining        Safety    • Patient will remain free of falls Maintaining    • Prevent any intentional injury Maintaining          Potential Risk for infection    • Pt will be free of infection Progressing towards goal

## 2014-07-16 NOTE — Progress Notes (Signed)
Patient educated on the use of insulin. Writer explained and demonstrated how to use a sliding scale with meals and also explained how the patient would draw up insulin/where he would administer the medication. Patient then verbalized understanding of process and repeated information just taught to him

## 2014-07-16 NOTE — Plan of Care (Signed)
Problem: Safety  Goal: Patient will remain free of falls  Outcome: Maintaining    Problem: Pain/Comfort  Goal: Patient’s pain or discomfort is manageable  Outcome: Maintaining    Problem: Nutrition  Goal: Patient’s nutritional status is maintained or improved  Outcome: Maintaining    Problem: Mobility  Goal: Patient’s functional status is maintained or improved  Outcome: Maintaining    Problem: Psychosocial  Goal: Demonstrates ability to cope with illness  Outcome: Maintaining    Problem: Cognitive function  Goal: Cognitive function will be maintained or return to baseline  Outcome: Maintaining

## 2014-07-17 ENCOUNTER — Encounter: Payer: Self-pay | Admitting: Internal Medicine

## 2014-07-17 HISTORY — PX: HH PICC PLACEMENT CONSULT HH ONLY: CON62000

## 2014-07-17 LAB — BASIC METABOLIC PANEL
Anion Gap: 11 (ref 7–16)
CO2: 26 mmol/L (ref 20–28)
Calcium: 8.3 mg/dL — ABNORMAL LOW (ref 9.0–10.3)
Chloride: 100 mmol/L (ref 96–108)
Creatinine: 0.68 mg/dL (ref 0.67–1.17)
GFR,Black: 134 *
GFR,Caucasian: 116 *
Glucose: 345 mg/dL — ABNORMAL HIGH (ref 60–99)
Lab: 7 mg/dL (ref 6–20)
Potassium: 4.3 mmol/L (ref 3.3–5.1)
Sodium: 137 mmol/L (ref 133–145)

## 2014-07-17 LAB — CBC
Hematocrit: 33 % — ABNORMAL LOW (ref 40–51)
Hemoglobin: 11.2 g/dL — ABNORMAL LOW (ref 13.7–17.5)
MCH: 28 pg (ref 26–32)
MCHC: 34 g/dL (ref 32–37)
MCV: 83 fL (ref 79–92)
Platelets: 254 10*3/uL (ref 150–330)
RBC: 4 MIL/uL — ABNORMAL LOW (ref 4.6–6.1)
RDW: 12.7 % (ref 11.6–14.4)
WBC: 5.1 10*3/uL (ref 4.2–9.1)

## 2014-07-17 LAB — POCT GLUCOSE
Glucose POCT: 296 mg/dL — ABNORMAL HIGH (ref 60–99)
Glucose POCT: 295 mg/dL — ABNORMAL HIGH (ref 60–99)
Glucose POCT: 301 mg/dL — ABNORMAL HIGH (ref 60–99)
Glucose POCT: 290 mg/dL — ABNORMAL HIGH (ref 60–99)
Glucose POCT: 281 mg/dL — ABNORMAL HIGH (ref 60–99)

## 2014-07-17 LAB — BENZODIAZEPINE, CONFIRMATION, URINE: Confirm BZD: POSITIVE

## 2014-07-17 MED ORDER — INSULIN LISPRO (HUMAN) 100 UNIT/ML SC SOPN *I*
PEN_INJECTOR | SUBCUTANEOUS | Status: AC
Start: 2014-07-17 — End: ?

## 2014-07-17 MED ORDER — LOSARTAN POTASSIUM 25 MG PO TABS *I*
50.0000 mg | ORAL_TABLET | Freq: Every day | ORAL | Status: DC
Start: 2014-07-17 — End: 2014-07-19
  Administered 2014-07-17 – 2014-07-19 (×3): 50 mg via ORAL
  Filled 2014-07-17 (×3): qty 2

## 2014-07-17 MED ORDER — SODIUM CHLORIDE 0.9 % INJ (FLUSH) WRAPPED *I*
10.0000 mL | Status: DC | PRN
Start: 2014-07-17 — End: 2014-07-17

## 2014-07-17 MED ORDER — ONETOUCH ULTRA 2 W/DEVICE KIT *A*
PACK | Status: AC
Start: 2014-07-17 — End: 2014-08-16

## 2014-07-17 MED ORDER — INSULIN PEN NEEDLE 32G X 4 MM MISC *A*
Status: AC
Start: 2014-07-17 — End: 2014-08-16

## 2014-07-17 MED ORDER — SYRINGE (DISPOSABLE) 10 ML MISC *A*
Status: AC
Start: 2014-07-17 — End: ?

## 2014-07-17 MED ORDER — SODIUM CHLORIDE 0.9 % INJ (FLUSH) WRAPPED *I*
10.0000 mL | Freq: Three times a day (TID) | Status: DC | PRN
Start: 2014-07-17 — End: 2014-07-19

## 2014-07-17 MED ORDER — SODIUM CHLORIDE 0.9 % INJ (FLUSH) WRAPPED *I*
10.0000 mL | Status: AC | PRN
Start: 2014-07-17 — End: ?

## 2014-07-17 MED ORDER — SODIUM CHLORIDE 0.9 % INJ (FLUSH) WRAPPED *I*
10.0000 mL | Freq: Three times a day (TID) | Status: DC | PRN
Start: 2014-07-17 — End: 2014-07-17

## 2014-07-17 MED ORDER — INSULIN LISPRO (HUMAN) 100 UNIT/ML IJ/SC SOLN *WRAPPED*
0.0000 [IU] | Freq: Three times a day (TID) | SUBCUTANEOUS | Status: DC
Start: 2014-07-17 — End: 2014-07-18
  Administered 2014-07-17: 15 [IU] via SUBCUTANEOUS
  Administered 2014-07-18: 16 [IU] via SUBCUTANEOUS

## 2014-07-17 MED ORDER — LIDOCAINE 5 % EX PTCH *I*
1.0000 | MEDICATED_PATCH | CUTANEOUS | Status: DC
Start: 2014-07-17 — End: 2014-07-19
  Administered 2014-07-17 – 2014-07-19 (×3): 1 via TRANSDERMAL
  Filled 2014-07-17 (×3): qty 1

## 2014-07-17 MED ORDER — VANCOMYCIN HCL 5000 MG IV SOLR WRAPPED *I*
1500.0000 mg | Freq: Three times a day (TID) | INTRAVENOUS | Status: DC
Start: 2014-07-17 — End: 2014-07-19
  Administered 2014-07-17 – 2014-07-19 (×6): 1500 mg via INTRAVENOUS
  Filled 2014-07-17 (×9): qty 30

## 2014-07-17 MED ORDER — HEPARIN LOCK FLUSH 10 UNIT/ML IJ SOLN WRAPPED *I*
50.0000 [IU] | Freq: Every day | INTRAVENOUS | Status: AC | PRN
Start: 2014-07-17 — End: ?

## 2014-07-17 MED ORDER — BENZONATATE 100 MG PO CAPS *I*
200.0000 mg | ORAL_CAPSULE | Freq: Three times a day (TID) | ORAL | Status: DC | PRN
Start: 2014-07-17 — End: 2014-07-19
  Administered 2014-07-17 (×3): 200 mg via ORAL
  Filled 2014-07-17 (×3): qty 2

## 2014-07-17 MED ORDER — INSULIN GLARGINE 100 UNIT/ML SC SOLN *WRAPPED*
33.0000 [IU] | Freq: Every evening | SUBCUTANEOUS | Status: DC
Start: 2014-07-17 — End: 2014-07-18
  Administered 2014-07-17: 33 [IU] via SUBCUTANEOUS

## 2014-07-17 MED ORDER — SODIUM CHLORIDE 0.9 % INJ (FLUSH) WRAPPED *I*
10.0000 mL | Status: DC | PRN
Start: 2014-07-17 — End: 2014-07-19

## 2014-07-17 MED ORDER — LIDOCAINE HCL 1 % IJ SOLN *I*
1.0000 mL | Freq: Once | INTRAMUSCULAR | Status: AC
Start: 2014-07-17 — End: 2014-07-17
  Administered 2014-07-17: 1 mL via INTRADERMAL

## 2014-07-17 MED ORDER — LANCETS MISC *A*
Status: AC
Start: 2014-07-17 — End: ?

## 2014-07-17 NOTE — Progress Notes (Signed)
Writer spoke with patient and wife in regards to I.V antibiotics at home, patient lives in [place]. Patient will be discharged home with I.V antibiotics. Writer coordinating with [facility] to set up antibiotics in [place]. Mrs.Kohlmann is [employer] fundraiser and will do teach with [employer] and administer antibiotics at home. Writer coordinating d/c with Dr.Ferrantino and  Marc Morgans PA (covering provider). Writer also provided education booklets about diabetes to patient and wife. Writer will continue to monitor.    Dulce Sellar BSN RN   Care Coordinator pager [phone]

## 2014-07-17 NOTE — Consults (Signed)
New Endocrine Inpatient Consult H&P     Consult Requested by: Estelle June, MD   Consult Question: uncontrolled diabetes     Chief Complaint:     History of Present Illness:    This is a 44 y.o. male with a history of type 2 diabetes since 2005.  The patient was transferred from OSH on 07/12/2014 after being treated for DKA, found to be bacteremic (MRSA), and started on IV antibiotics. Patient was also found to have bilateral foot ulcers/callouses (negative for osteo), evaluated by podiatry. At [facility], patient had TEE revealing tricuspid endocarditis (MRSA), elevated blood glucoses, and hypoxia. I have been asked to consult regarding uncontrolled diabetes requiring insulin management.     Patient diagnosed with type 2 diabetes in 2005, started on Metformin. Did follow up with PCP yearly and improved HbA1c to 9% in early diagnosis. Does not check blood glucoses at home, but "I can feel when my sugar is high". Increased urination, dry mouth, and blurry vision over last 2 years. He admits to being stubborn in his ways, not eating right, and not taking medication as prescribed. Biggest concern about his diabetes dx is having to take insulin.     Wife is setting up appointments for diabetes follow up in [place].     Blood glucoses running: 345-296 overnight and this AM. On vancomycin with dextrose q 12 hours. Currently on Lantus 27 units at night, Humalog 9 units + correction with meals.   HbA1c 11.5% 12/15       Past Medical History   Diagnosis Date    Diabetes mellitus type 2, uncontrolled      Past Surgical History   Procedure Laterality Date    Hh picc placement consult hh only  07/17/2014           Family History   Problem Relation Age of Onset    Diabetes Mother     Diabetes Father      History     Social History    Marital Status: Married     Spouse Name: N/A     Number of Children: N/A    Years of Education: N/A     Social History Main Topics    Smoking status: Never Smoker     Smokeless tobacco: None     Alcohol Use: No    Drug Use: No    Sexual Activity: None     Other Topics Concern    None     Social History Narrative       Allergies: No Known Allergies (drug, envir, food or latex)    Active Hospital Medications:  Current Facility-Administered Medications   Medication Dose Route Frequency    benzonatate (TESSALON) capsule 200 mg  200 mg Oral TID PRN    lidocaine (LIDODERM) 5 % patch 1 patch  1 patch Transdermal Q24H    sodium chloride 0.9 % flush 10 mL  10 mL Intracatheter Q8H PRN    sodium chloride 0.9 % flush 10 mL  10 mL Intracatheter PRN    losartan (COZAAR) tablet 50 mg  50 mg Oral Daily    insulin lispro (HumaLOG) injection 0-20 Units  0-20 Units Subcutaneous TID WC    insulin glargine (LANTUS) injection 33 Units  33 Units Subcutaneous Nightly    vancomycin (VANCOCIN) 1,500 mg in sodium chloride 0.9 % 275 mL IVPB  1,500 mg Intravenous Q8H    Influenza vaccine (FLUARIX) syringe 0.15m - Quad, PF, for patients aged 3 years and older  0.5 mL Intramuscular Prior to discharge -vaccine    phenol-menthol (CEPASTAT) lozenge 1 lozenge  1 lozenge Buccal Q2H PRN    guaiFENesin (ROBITUSSIN) 100 MG/5ML syrup 200 mg  200 mg Oral Q6H PRN    enoxaparin (LOVENOX) injection 40 mg  40 mg Subcutaneous Daily    acetaminophen (TYLENOL) tablet 650 mg  650 mg Oral Q4H PRN    ondansetron (ZOFRAN) injection 4 mg  4 mg Intravenous Q6H PRN    dextrose (GLUTOSE) 40 % oral gel 15 g  15 g Oral PRN    Dextrose 50 % solution 25 g  25 g Intravenous PRN    glucagon (GLUCAGEN) injection 1 mg  1 mg Intramuscular PRN       Review of Systems:  CONSTITUTIONAL:  Appetite good, + weight loss  HEAD: No headache, dizziness, or syncope  EYES: + blurry vision changes, no eye pain  ENT: No hearing difficulties or ear pain  CV: No chest pain, shortness of breath, or edema.  RESPIRATORY:  No sob, + cough, or wheezing.  GI:  No nausea, vomiting, abdominal pain, or change in bowel habits  GU:  No dysuria, urgency, or  incontinence  NEURO:  No mental status changes, motor weakness, or sensory changes  PSYCH:  No depression or anxiety  MS:  No joint pain, swelling, or musculoskeletal deformities  HEME/LYMPH:  No easy bleeding, bruising, or swollen nodes  ENDOCRINE:  Improved polyuria, polydipsia, or heat intolerance      Physical Examination:   CONSTITUTIONAL:   Filed Vitals:    07/17/14 0606   BP: 168/90   Pulse: 92   Temp: 36.4 C (97.5 F)   Resp: 16   Height:    Weight:    He is well developed, well nourished, no acute distress  HEENT: PERLA, EOMI, no lid lag, pink conjunctiva, no proptosis.  NECK: supple, no thyromegaly  ABDOMEN: soft, nontender, nondistended  NEUROLOGICAL: Alert and oriented x 3  ENDOCRINE: No supraclavicular fat pads, facial plethora, moon facies, abdominal striae, thyromegaly.    Labs and Imaging:    Chemistry        Lab results: 07/17/14  0613   SODIUM 137   POTASSIUM 4.3   CHLORIDE 100   CO2 26   GFR,CAUCASIAN 116   GFR,BLACK 134   UN 7   CREATININE 0.68        Lab results: 07/17/14  0613  07/12/14  2317   GLUCOSE 345*  < > 258*   CALCIUM 8.3*  < > 8.2*   TOTAL PROTEIN  --   --  5.5*   ALBUMIN  --   --  3.0*   ALT  --   --  41   AST  --   --  29   ALK PHOS  --   --  39*   BILIRUBIN,TOTAL  --   --  0.6   < > = values in this interval not displayed.       No components found for: HGBA1C  Lab Results   Component Value Date    ALT 41 07/12/2014    AST 29 07/12/2014     No results found for: CHOL, HDL, LDLC, TRIG, CHHDC  No results found for: TSH, T3, T4, THPER  Lab Results   Component Value Date    WBC 5.1 07/17/2014    HGB 11.2* 07/17/2014    HCT 33* 07/17/2014    MCV 83 07/17/2014    PLT 254 07/17/2014  Radiology impressions (last 3 days):  No results found.      Currently Active/Followed Hospital Problems:  Active Hospital Problems    Diagnosis    Endocarditis    Bacteremia due to Staphylococcus aureus       Assessment: This is a 44 y.o. male with uncontrolled diabetes admitted with bacteremia  and TV endocarditis with continued hyperglycemia due to insulin insufficiency, abx therapy, and po intake. Patient requires insulin management at discharge.     Education: spent significant time with patient and wife discussing blood glucose control. Reviewed diabetes pathophysiology, complications, and blood glucose goals.   - Nursing to review insulin pen teaching prior to discharge   - Nursing to review blood glucose monitor teaching prior to discharge   - Diabetes education booklet given     Plan:   - Vancomycin solute changed to normal saline   - Lantus and Humalog were increased by medicine team today   - Adjust insulin as needed   - Consider nutrition consult if not already done   - Wife scheduling follow up appointment with endocrine in [place]     Rx needed:  Lantus Solostar insulin pen - 1 box   Humalog kwik pen - 1 box   BD insulin pen needles "nano" - 1 box   Freestyle lite meter   Freestyle lite test strips   Freestyle lite lancets     Prentice Docker, NP  07/17/2014  3:01 PM    Prentice Docker NP BC-ADM   Department of Endocrinology  [address], [place]  [place], [place] [address]  Phone: [phone]  Fax: [phone]  Pager: [phone]

## 2014-07-17 NOTE — Progress Notes (Addendum)
Pt's PCP:   Dr. Lennon Alstrom. Mayford Knife, MD    Address: [address] [place], [place] [address]   [phone]  [phone]

## 2014-07-17 NOTE — Procedures (Addendum)
PICC ASSESSMENT AND PROCEDURE NOTE    Lot #:      [id number]                                 Reference # : [id number]    Size:4 French Single Lumen PICC  Side:right    Time out documentation completed in Procedure navigator prior to procedure:  Yes    Indications  Long Term Abx    Education Provided to Patient /Family : yes  Catheter Associated Blood Stream Infection Education Handout Provided and Explained to Pt/family : yes  Reinforcement Needed : yes  Recommendations : PICC Line  Refer to Radiology :No  Name of Provider Contacted :     Procedure Details Procedure Date :07/17/2014  Time In : 1049  Time Out : 1119  Guide Wire Removed : yes    Findings  Catheter inserted to 38cm, with 1cm exposed.  Mid upper arm circumference is 33cm. Accessed basilic vein    Complications  none    Condition  Pain Rating (1-10) : 1  Comfort Measures Provided : Pain Medication as Ordered by Provider   Patient Care Booklet Given to Patient : yes  Patient Care Booklet Placed in Chart : no    Plan/Orders  PICC Tip verified with ECG Confirmation: Yes.  PICC confirmed to be in lower SVC. Floor Nurse Duwayne Heck is aware of line ok to use.    STAT Portable Chest X-Ray Ordered : no  VBG Sent : no  Other :     Floor Nurse Duwayne Heck  Is aware of need for orders to use the line from Provider     PICC Service  HH 11-1614 - [id number]  [facility] 11-1614 - [id number]    EBL  Estimated Blood Loss : Minimal    Disposition  Pt at baseline.    Dorthula Nettles, RN  07/17/2014  11:03 AM

## 2014-07-17 NOTE — Progress Notes (Addendum)
Hospitalist Progress Note    Interval History:  He has a persistent dry cough, which he has had for about a year but has worsened recently. His wife reports possible asbestos exposure through work, as well as secondhand smoke exposure. He strained his back from coughing. He otherwise feels fine.    Intake/Output    Intake/Output Summary (Last 24 hours) at 07/17/14 1310  Last data filed at 07/17/14 1238   Gross per 24 hour   Intake    501 ml   Output      0 ml   Net    501 ml        Vital Signs:   Temp:  [36.2 C (97.2 F)-37.1 C (98.8 F)] 36.4 C (97.5 F)  Heart Rate:  [77-92] 92  Resp:  [16-20] 16  BP: (130-168)/(74-90) 168/90 mmHg       Physical Exam:    General: NAD  Cardiovascular: RRR, S1/S2  Pulmonary: Mildly decreased breath sounds at the bases  Gastrointestinal: +BS, soft, NT, ND    Data:      Recent Labs  Lab 07/17/14  0613 07/15/14  0213 07/14/14  0413   WBC 5.1 5.6 6.0   HEMOGLOBIN 11.2* 11.2* 10.9*   HEMATOCRIT 33* 33* 32*   PLATELETS 254 225 189     Other Labs:      Recent Labs  Lab 07/17/14  0613 07/15/14  0213 07/14/14  0413   SODIUM 137 141 139   POTASSIUM 4.3 3.3 3.7   CHLORIDE 100 100 99   CO2 UN CREATININE 0.68 0.80 0.74   GFR,CAUCASIAN 116 109 112   GFR,BLACK 134 126 130   GLUCOSE 345* 299* 302*   CALCIUM 8.3* 8.4* 8.0*      X-rays the past 24 hours: No results found.     Assessment/Plan  Active Hospital Problems    Diagnosis    Endocarditis    Bacteremia due to Staphylococcus aureus      Resolved Hospital Problems    Diagnosis   No resolved problems to display.     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 Trough ordered for tomorrow.  -PICC line placed today  -F/u final blood cultures. As of right now, it appears he has cleared the bacteremia.  -ID following  -Will need to coordinate outpatient care with his PCP    2. Uncontrolled DM 2  -Increase lantus to 33 units qhs  -SSI, increase baseline to 11  units  -Diabetes/insulin teaching  -Diabetes educator to see him tomorrow    3. DVT prophylaxis  -Enoxaparin    4. HTN  -Start losartan 50 mg daily    5. Chronic cough- could be related to previous environmental exposures, and worsened by fluid overload and possible septic emboli to the lungs. No clear symptoms to suggest GERD, post-nasal drip, or asthma.  -F/u with PCP, consider pulm eval and CT scan  -Symptomatic treatment for now    DC Planning  Likely d/c in 2 days  D/w wife, PA, care coordinator    35 minutes spent in total, >50% counseling patient and coordinating care      Larey Days, MD   07/17/2014   1:10 PM

## 2014-07-17 NOTE — Plan of Care (Signed)
Cognitive function     Cognitive function will be maintained or return to baseline Maintaining        Impaired Gas Exchange     Patient will maintain adequate oxygenation Maintaining        Ineffective Airway Clearance (Aspiration Precautions)     Patient will maintain patent airway Maintaining        Ineffective Breathing Pattern     Patient will achieve/maintain normal respiratory rate/effort Maintaining        Mobility     Patient's functional status is maintained or improved Maintaining        Nutrition     Patient's nutritional status is maintained or improved Maintaining        Potential Risk for infection     Pt will be free of infection Maintaining        Psychosocial     Demonstrates ability to cope with illness Maintaining        Safety     Patient will remain free of falls Maintaining     Prevent any intentional injury Maintaining          Pain/Comfort     Patient's pain or discomfort is manageable Progressing towards goal        Dakota Mcfarland C

## 2014-07-18 LAB — BLOOD CULTURE: Bacterial Blood Culture: 0

## 2014-07-18 LAB — MIN INHIB CONCENTRATION

## 2014-07-18 LAB — POCT GLUCOSE
Glucose POCT: 338 mg/dL — ABNORMAL HIGH (ref 60–99)
Glucose POCT: 311 mg/dL — ABNORMAL HIGH (ref 60–99)
Glucose POCT: 248 mg/dL — ABNORMAL HIGH (ref 60–99)
Glucose POCT: 138 mg/dL — ABNORMAL HIGH (ref 60–99)
Glucose POCT: 182 mg/dL — ABNORMAL HIGH (ref 60–99)

## 2014-07-18 LAB — VANCOMYCIN, TROUGH: Vancomycin Trough: 18.4 ug/mL (ref 10.0–20.0)

## 2014-07-18 MED ORDER — INSULIN GLARGINE 100 UNIT/ML SC SOLN *WRAPPED*
20.0000 [IU] | Freq: Once | SUBCUTANEOUS | Status: AC
Start: 2014-07-18 — End: 2014-07-18
  Administered 2014-07-18: 20 [IU] via SUBCUTANEOUS

## 2014-07-18 MED ORDER — INSULIN LISPRO (HUMAN) 100 UNIT/ML IJ/SC SOLN *WRAPPED*
0.0000 [IU] | Freq: Three times a day (TID) | SUBCUTANEOUS | Status: DC
Start: 2014-07-18 — End: 2014-07-19
  Administered 2014-07-18: 15 [IU] via SUBCUTANEOUS
  Administered 2014-07-18: 21 [IU] via SUBCUTANEOUS
  Administered 2014-07-19: 23 [IU] via SUBCUTANEOUS
  Administered 2014-07-19: 21 [IU] via SUBCUTANEOUS

## 2014-07-18 MED ORDER — OTHER SUPPLY *A*
Status: AC
Start: 2014-07-18 — End: ?

## 2014-07-18 MED ORDER — VANCOMYCIN HCL 500 MG IV SOLR (50 MG/ML) *I*
1500.0000 mg | Freq: Three times a day (TID) | INTRAVENOUS | Status: AC
Start: 2014-07-18 — End: 2014-08-24

## 2014-07-18 MED ORDER — DEXTROSE 50 % IV SOLN *I*
25.0000 g | INTRAVENOUS | Status: DC | PRN
Start: 2014-07-18 — End: 2014-07-19

## 2014-07-18 MED ORDER — INSULIN GLARGINE 100 UNIT/ML SC SOLN *WRAPPED*
35.0000 [IU] | Freq: Every evening | SUBCUTANEOUS | Status: DC
Start: 2014-07-18 — End: 2014-07-19
  Administered 2014-07-18: 35 [IU] via SUBCUTANEOUS

## 2014-07-18 MED ORDER — GLUCOSE 40 % PO GEL *I*
15.0000 g | ORAL | Status: DC | PRN
Start: 2014-07-18 — End: 2014-07-19

## 2014-07-18 MED ORDER — GLUCAGON HCL (RDNA) 1 MG IJ SOLR *WRAPPED*
1.0000 mg | INTRAMUSCULAR | Status: DC | PRN
Start: 2014-07-18 — End: 2014-07-19

## 2014-07-18 MED ORDER — INSULIN GLARGINE 100 UNIT/ML SC SOLN *WRAPPED*
20.0000 [IU] | Freq: Every morning | SUBCUTANEOUS | Status: DC
Start: 2014-07-19 — End: 2014-07-19
  Administered 2014-07-19: 20 [IU] via SUBCUTANEOUS

## 2014-07-18 NOTE — Progress Notes (Signed)
Inpatient Endocrinology Follow Up Note.    Initial Consult Question: DKA, hyperglycemia     Interval History:  Follow up of blood glucoses and insulin requirements in patient with uncontrolled diabetes. Blood glucoses running: 281-338 overnight and this AM. Currently on Lantus 33 units at night and Humalog 11 units + correction scale. Patient denies any PO intake overnight.     Active Hospital Medications:  Current Facility-Administered Medications   Medication Dose Route Frequency    benzonatate (TESSALON) capsule 200 mg  200 mg Oral TID PRN    lidocaine (LIDODERM) 5 % patch 1 patch  1 patch Transdermal Q24H    sodium chloride 0.9 % flush 10 mL  10 mL Intracatheter Q8H PRN    sodium chloride 0.9 % flush 10 mL  10 mL Intracatheter PRN    losartan (COZAAR) tablet 50 mg  50 mg Oral Daily    insulin lispro (HumaLOG) injection 0-20 Units  0-20 Units Subcutaneous TID WC    insulin glargine (LANTUS) injection 33 Units  33 Units Subcutaneous Nightly    vancomycin (VANCOCIN) 1,500 mg in sodium chloride 0.9 % 275 mL IVPB  1,500 mg Intravenous Q8H    Influenza vaccine (FLUARIX) syringe 0.585mL - Quad, PF, for patients aged 3 years and older  0.5 mL Intramuscular Prior to discharge -vaccine    phenol-menthol (CEPASTAT) lozenge 1 lozenge  1 lozenge Buccal Q2H PRN    guaiFENesin (ROBITUSSIN) 100 MG/5ML syrup 200 mg  200 mg Oral Q6H PRN    enoxaparin (LOVENOX) injection 40 mg  40 mg Subcutaneous Daily    acetaminophen (TYLENOL) tablet 650 mg  650 mg Oral Q4H PRN    ondansetron (ZOFRAN) injection 4 mg  4 mg Intravenous Q6H PRN    dextrose (GLUTOSE) 40 % oral gel 15 g  15 g Oral PRN    Dextrose 50 % solution 25 g  25 g Intravenous PRN    glucagon (GLUCAGEN) injection 1 mg  1 mg Intramuscular PRN       Physical Examination:   CONSTITUTIONAL:   Filed Vitals:    07/18/14 0836   BP: 152/68   Pulse: 84   Temp:    Resp:    Height:    Weight:    He is well developed, well nourished, no acute distress  HEENT: PERLA, EOMI, no  lid lag, pink conjunctiva, no proptosis.  NECK: supple, no thyromegaly, no lymphadenopathy  ABDOMEN: soft, nontender, nondistended  EXTREMITIES:  + lateral callouses bilaterally with ulceration, bandaged at this time  NEUROLOGICAL: Alert and oriented x 3  SKIN:  No rashes.  ENDOCRINE: No supraclavicular fat pads, facial plethora, moon facies    Labs and Imaging:    Assessment: This is a 44 y.o. male with uncontrolled diabetes admitted with bacteremia and TV endocarditis with continued hyperglycemia overnight due to insulin resistance and insufficiency.    Education: nursing to continue diabetes education at bedside. Review insulin pen use and blood glucose meter use.     Plan:   1. Medication adjustments:  - Lantus 20 units this morning and Lantus 35 units at night   - Increase Humalog to 15 units + moderate correction scale.   - TSH     Please feel free to call me with any questions or concerns. Pager# [phone]     Costella HatcherNDREA Raeana Blinn, NP  07/18/2014  10:27 AM    Costella HatcherAndrea Leiya Keesey NP BC-ADM   Department of Endocrinology  [address], [address]  [place], [place] [id number]  Phone: [phone]  Fax: [phone]  Pager: [phone]

## 2014-07-18 NOTE — Progress Notes (Signed)
Writer spoke with Lonell FaceJen Simmons RN ([facility] infusion company), once script for vancomycin received, fax to [phone]. Writer updated Evalee Jeffersonhelsey Wallace PA (covering provider). Writer will continue to monitor.    Dulce SellarImani Juliette Standre BSN RN   Care Coordinator pager [phone]

## 2014-07-18 NOTE — Consults (Signed)
Pt and spouse provided with diet instruction as requested for Healthy eating with diabetes, counting carbohydrates and the healthy plate method. Pt is from [place], Wife states pt will follow up with Endocrinologist.   Melvern Sampleonna M Monae Topping, DTR

## 2014-07-18 NOTE — Progress Notes (Addendum)
07/18/2014  Infectious Disease Progress Note     Antibitoic status:   Current Antibiotics   Medication Dose Frequency Extra Info    vancomycin (VANCOCIN) 1,500 mg in sodium chloride 0.9 % 275 mL IVPB  1,500 mg Q8H :Day # 6        Subjective: No new complaints.  Feels good.    Objective: Awake and alert.  Sclera and conjunctiva clear,  Temp:  [36.1 C (97 F)-37.1 C (98.8 F)] 36.6 C (97.9 F)  Heart Rate:  [82-95] 83  Resp:  [16-20] 16  BP: (132-170)/(68-96) 132/74 mmHg  PICC site is clean, lungs are clear, regular rate and rhythm with no murmur, abdomen soft    Lab & Micro: Vancomycin MIC to his MRSA is 1.0.  Daptomycin 0.25, linezolid 2.0    vancomycin trough was 18.4  Assessment/Plan:   The MRSA isolate is sensitive to vancomycin, daptomycin, and linezolid.  An MIC of 1.0 vancomycin is the appropriate choice at this time.  Would continue for an anticipated course of 6 weeks.  Goal trough is 15-20.  He will be following up [place] primary care and infectious [place].  Weekly CBC with diff, BMP, vancomycin trough.  Repeat vancomycin trough within 3 days of discharge.    Hyman HopesSTEVEN M Maribella Kuna, MD  07/18/2014 4:58 PM

## 2014-07-18 NOTE — Plan of Care (Signed)
Cognitive function     Cognitive function will be maintained or return to baseline Maintaining        Impaired Gas Exchange     Patient will maintain adequate oxygenation Maintaining        Ineffective Airway Clearance (Aspiration Precautions)     Patient will maintain patent airway Maintaining        Ineffective Breathing Pattern     Patient will achieve/maintain normal respiratory rate/effort Maintaining        Mobility     Patient's functional status is maintained or improved Maintaining        Nutrition     Patient's nutritional status is maintained or improved Maintaining        Safety     Patient will remain free of falls Maintaining     Prevent any intentional injury Maintaining          Pain/Comfort     Patient's pain or discomfort is manageable Progressing towards goal        Potential Risk for infection     Pt will be free of infection Progressing towards goal        Psychosocial     Demonstrates ability to cope with illness Progressing towards goal        Marchelle FolksAmanda Cochrane, RN

## 2014-07-18 NOTE — Progress Notes (Addendum)
Writer spoke wtih Lonell FaceJen Simmons RN [phone]) [employer], patient will need coordination of medication and [place] [facility] [place] and  teach for I.V antibiotics. Candise BowensJen will be coming [facility] today to do teach with patient and wife, Clinical research associatewriter updated family they will be available. Writer also gave patient One touch ultra glucometer to begin review/teach with nursing. Writer will continue to monitor.    Dulce SellarImani Raegen Tarpley BSN RN   Care Coordinator pager [phone]

## 2014-07-18 NOTE — Progress Notes (Signed)
Pharmacist Pharmacokinetic Note - Vancomycin    General  Patient is receiving vancomycin for tricuspid valve endocarditis with MRSA bacteremia     Current vancomycin regimen: 1500 mg q8h    Today is day 6 of therapy.      Goal vancomycin trough concentration:  15-20 mcg/mL.    Laboratory Data      Lab results: 07/17/14  [id number] 07/15/14  0213 07/14/14  0413   WBC 5.1 5.6 6.0         Lab results: 07/17/14  0613 07/15/14  0213 07/14/14  0413   CREATININE 0.68 0.80 0.74         Lab results: 07/17/14  0613 07/15/14  0213 07/14/14  0413   UN 7 10 11            Vancomycin concentrations:        Lab results: 07/18/14  1122 07/16/14  0254 07/15/14  0213 07/13/14  0939   VANCOMYCIN TROUGH 18.4 18.0 11.9  --    VANCOMYCIN LEVEL  --   --   --  3.7          Assessment  Reported vancomycin concentration of 18.4 is within therapeutic range    Recommendation  Based on calculations, patient's renal function, volume status, and reported concentrations, recommend continuing current dose.     Recommend next vancomycin trough concentration to be drawn on 1/8    For questions contact pharmacy at extension [phone].      Elam Dutchyan Canova Senn, PharmD

## 2014-07-18 NOTE — Progress Notes (Signed)
Hospitalist Progress Note    Interval History:  His back pain is much better with the lidocaine patch. His cough is less. Has had some diabetes teaching. Pt and wife hope to be discharged tomorrow.    Intake/Output    Intake/Output Summary (Last 24 hours) at 07/18/14 1324  Last data filed at 07/18/14 0959   Gross per 24 hour   Intake   1450 ml   Output      0 ml   Net   1450 ml        Vital Signs:   Temp:  [36.1 C (97 F)-37.1 C (98.8 F)] 36.3 C (97.3 F)  Heart Rate:  [82-95] 82  Resp:  [16-24] 16  BP: (138-170)/(68-96) 146/80 mmHg       Physical Exam:    General: NAD, dry cough  Cardiovascular: RRR, S1/S2, no murmur heard  Pulmonary: Mildly decreased breath sounds at the bases  Gastrointestinal: +BS, soft, NT, ND    Data:      Recent Labs  Lab 07/17/14  0613 07/15/14  0213 07/14/14  0413   WBC 5.1 5.6 6.0   HEMOGLOBIN 11.2* 11.2* 10.9*   HEMATOCRIT 33* 33* 32*   PLATELETS 254 225 189     Other Labs:      Recent Labs  Lab 07/17/14  0613 07/15/14  0213 07/14/14  0413   SODIUM 137 141 139   POTASSIUM 4.3 3.3 3.7   CHLORIDE 100 100 99   CO2 26 28 26    UN 7 10 11    CREATININE 0.68 0.80 0.74   GFR,CAUCASIAN 116 109 112   GFR,BLACK 134 126 130   GLUCOSE 345* 299* 302*   CALCIUM 8.3* 8.4* 8.0*      X-rays the past 24 hours: No results found.     Assessment/Plan  Active Hospital Problems    Diagnosis    Endocarditis    Bacteremia due to Staphylococcus aureus      Resolved Hospital Problems    Diagnosis   No resolved problems to display.     1. Tricuspid valve endocarditis with MRSA bacteremia  -IV vancomycin for 6 weeks (day 5). Trough is appropriate.  -PICC line in place  -F/u final blood cultures  -ID following  -PCP has already arranged outpatient ID referral for next week    2. Uncontrolled DM 2  -Lantus increased to 20 units qam, 35 units qhs  -SSI, increase baseline to 15 units  -Diabetes/insulin teaching  -Diabetes educator assisting    3. DVT prophylaxis  -Enoxaparin    4.  HTN  -Losartan 50 mg daily    5. Chronic cough  -Symptomatic treatment  -F/u with PCP for further workup    DC Planning  Hopefully d/c tomorrow     Larey DaysMICHAEL Crystina Borrayo, MD   07/18/2014   1:24 PM

## 2014-07-18 NOTE — Plan of Care (Signed)
Cognitive function    • Cognitive function will be maintained or return to baseline Maintaining        Impaired Gas Exchange    • Patient will maintain adequate oxygenation Maintaining        Ineffective Airway Clearance (Aspiration Precautions)    • Patient will maintain patent airway Maintaining        Ineffective Breathing Pattern    • Patient will achieve/maintain normal respiratory rate/effort Maintaining        Mobility    • Patient's functional status is maintained or improved Maintaining        Nutrition    • Patient's nutritional status is maintained or improved Maintaining        Psychosocial    • Demonstrates ability to cope with illness Maintaining        Safety    • Patient will remain free of falls Maintaining    • Prevent any intentional injury Maintaining          Pain/Comfort    • Patient's pain or discomfort is manageable Progressing towards goal        Potential Risk for infection    • Pt will be free of infection Progressing towards goal

## 2014-07-19 LAB — BASIC METABOLIC PANEL
Anion Gap: 12 (ref 7–16)
CO2: 28 mmol/L (ref 20–28)
Calcium: 8.7 mg/dL — ABNORMAL LOW (ref 9.0–10.3)
Chloride: 99 mmol/L (ref 96–108)
Creatinine: 0.72 mg/dL (ref 0.67–1.17)
GFR,Black: 131 *
GFR,Caucasian: 113 *
Glucose: 213 mg/dL — ABNORMAL HIGH (ref 60–99)
Lab: 10 mg/dL (ref 6–20)
Potassium: 4.6 mmol/L (ref 3.3–5.1)
Sodium: 139 mmol/L (ref 133–145)

## 2014-07-19 LAB — CBC
Hematocrit: 33 % — ABNORMAL LOW (ref 40–51)
Hemoglobin: 10.9 g/dL — ABNORMAL LOW (ref 13.7–17.5)
MCH: 28 pg (ref 26–32)
MCHC: 33 g/dL (ref 32–37)
MCV: 84 fL (ref 79–92)
Platelets: 271 10*3/uL (ref 150–330)
RBC: 3.9 MIL/uL — ABNORMAL LOW (ref 4.6–6.1)
RDW: 12.4 % (ref 11.6–14.4)
WBC: 6.1 10*3/uL (ref 4.2–9.1)

## 2014-07-19 LAB — POCT GLUCOSE
Glucose POCT: 163 mg/dL — ABNORMAL HIGH (ref 60–99)
Glucose POCT: 257 mg/dL — ABNORMAL HIGH (ref 60–99)
Glucose POCT: 277 mg/dL — ABNORMAL HIGH (ref 60–99)

## 2014-07-19 MED ORDER — INSULIN GLARGINE 100 UNIT/ML SC SOPN *I*
PEN_INJECTOR | SUBCUTANEOUS | Status: AC
Start: 2014-07-19 — End: ?

## 2014-07-19 MED ORDER — GLUCOSE BLOOD VI STRP *A*
ORAL_STRIP | Status: AC
Start: 2014-07-19 — End: ?

## 2014-07-19 MED ORDER — INSULIN GLARGINE 100 UNIT/ML SC SOLN *WRAPPED*
20.0000 [IU] | Freq: Every morning | SUBCUTANEOUS | Status: DC
Start: 2014-07-19 — End: 2014-07-19

## 2014-07-19 MED ORDER — LOSARTAN POTASSIUM 25 MG PO TABS *I*
50.0000 mg | ORAL_TABLET | Freq: Once | ORAL | Status: AC
Start: 2014-07-19 — End: 2014-07-19
  Administered 2014-07-19: 50 mg via ORAL
  Filled 2014-07-19: qty 2

## 2014-07-19 MED ORDER — LIDOCAINE 5 % EX PTCH *I*
1.0000 | MEDICATED_PATCH | CUTANEOUS | Status: AC
Start: 2014-07-19 — End: ?

## 2014-07-19 MED ORDER — LOSARTAN POTASSIUM 100 MG PO TABS *I*
100.0000 mg | ORAL_TABLET | Freq: Every day | ORAL | Status: AC
Start: 2014-07-20 — End: 2014-08-19

## 2014-07-19 MED ORDER — LOSARTAN POTASSIUM 25 MG PO TABS *I*
100.0000 mg | ORAL_TABLET | Freq: Every day | ORAL | Status: DC
Start: 2014-07-20 — End: 2014-07-19

## 2014-07-19 NOTE — Progress Notes (Addendum)
Hospitalist Progress Note    Interval History:  He feels well and has no complaints aside from the dry cough.    Intake/Output    Intake/Output Summary (Last 24 hours) at 07/19/14 1041  Last data filed at 07/19/14 0820   Gross per 24 hour   Intake   1335 ml   Output      0 ml   Net   1335 ml        Vital Signs:   Temp:  [36 C (96.8 F)-36.9 C (98.4 F)] 36.1 C (97 F)  Heart Rate:  [83-101] 88  Resp:  [16-20] 16  BP: (132-170)/(74-98) 140/80 mmHg       Physical Exam:    General: NAD  Cardiovascular: RRR, S1/S2, no murmur  Pulmonary: Decreased breath sounds at the bases  Gastrointestinal: +BS, soft, NT, ND    Data:      Recent Labs  Lab 07/19/14  0352 07/17/14  0613 07/15/14  0213   WBC 6.1 5.1 5.6   HEMOGLOBIN 10.9* 11.2* 11.2*   HEMATOCRIT 33* 33* 33*   PLATELETS 271 254 225     Other Labs:      Recent Labs  Lab 07/19/14  0352 07/17/14  0613 07/15/14  0213   SODIUM 139 137 141   POTASSIUM 4.6 4.3 3.3   CHLORIDE 99 100 100   CO2 28 26 28    UN 10 7 10    CREATININE 0.72 0.68 0.80   GFR,CAUCASIAN 113 116 109   GFR,BLACK 131 134 126   GLUCOSE 213* 345* 299*   CALCIUM 8.7* 8.3* 8.4*      X-rays the past 24 hours: No results found.     Assessment/Plan  Active Hospital Problems    Diagnosis    Endocarditis      Resolved Hospital Problems    Diagnosis    Bacteremia due to Staphylococcus aureus     1. Tricuspid valve endocarditis and MRSA bacteremia- repeat blood cultures are negative thus far.  -IV vancomycin for 6 weeks (day 6). Confirm with pharmacy when the next trough should be checked.  -PICC line in place  -IV antibiotic teaching done with pt's wife  -TEE images provided to patient  -F/u final blood cultures    2. Uncontrolled DM 2  -Increase lantus to 20 units qam, 40 units qhs  -SSI, baseline 15 units  -Diabetes/insulin teaching    3. HTN- uncontrolled  -Increase losartan to 100 mg daily    4. Chronic cough  -Symptomatic treatment  -F/u with PCP for further workup    5. Back  pain  -Lidocaine patch    6. DVT prophylaxis  -Enoxaparin    7. Plantar wounds  -Local wound care    DC Planning  D/c today  Weekly CBC with diff, BMP, vanco trough- PCP will need to arrange.  Pt to drive back to [place] tomorrow, with wife Banker(RN) giving antibiotic infusions. All prescriptions to be filled here. I have tried to contact his PCP and am awaiting a return phone call.    >30 minutes spent in total     Larey DaysMICHAEL Anakaren Campion, MD   07/19/2014   10:41 AM

## 2014-07-19 NOTE — Plan of Care (Signed)
Cognitive function    • Cognitive function will be maintained or return to baseline Adequate for discharge        Impaired Gas Exchange    • Patient will maintain adequate oxygenation Adequate for discharge        Ineffective Airway Clearance (Aspiration Precautions)    • Patient will maintain patent airway Adequate for discharge        Ineffective Breathing Pattern    • Patient will achieve/maintain normal respiratory rate/effort Adequate for discharge        Mobility    • Patient's functional status is maintained or improved Adequate for discharge        Nutrition    • Patient's nutritional status is maintained or improved Adequate for discharge        Pain/Comfort    • Patient's pain or discomfort is manageable Adequate for discharge        Potential Risk for infection    • Pt will be free of infection Adequate for discharge        Psychosocial    • Demonstrates ability to cope with illness Adequate for discharge        Safety    • Patient will remain free of falls Adequate for discharge    • Prevent any intentional injury Adequate for discharge

## 2014-07-19 NOTE — Discharge Instructions (Signed)
Brief Summary of Your Hospital Course (including key procedures and diagnostic test results):  [facility] for bacteremia (infection in your blood).  You underwent an TEE (transesophageal echocardiogram) and found to have a vegetation (bacteria) on your tricuspid heart valve.  You were treated with IV antibiotics. You were seen by infectious disease and they have recommended IV vancomycin for a total of 6 weeks.  You were also seen by endocrinology and you were started on insulin.     Your instructions:  Take medications as prescribed.   PICC line care per home health   Follow up with infectious disease MD, cardiology, podiatry and endocrinology.      You will need to have blood work done weekly, CBC, BMP, and vancomycin level. First blood draw should be 07/24/13.     Insulin Regimen:   Inject Lantus 20 units every morning  Inject Lantus 40 units every evening     Utilize Humalog Insulin Sliding Scale three times daily before meals.  You should give 15 units as a nutritional dose and then additional units based on the sliding scale below:     HUMALOG SLIDING SCALE  Give nutritional insulin 15 units   HOLD nutritional insulin if NPO or eaten less than 50% of meal.    Correctional insulin given EVEN if NPO as outlined below:    <70  Hold Insulin, Hypoglycemia Protocol and call primary care provider   70-140  If pt is eating give nutritional insulin ONLY, hold if NPO  141-180 Give 2 Units + Nutritional   181-220  Give 4 Units + Nutritional  221-260 Give 6 Units +Nutritional   261-300  Give 8 Units + Nutritional   301-350 Give 10 Units + Nutritional   351-400 Give 12 Units + Nutritional   >400                Give 14 Units + Nutritional and call primary care provider       Recommended diet: diabetic diet    Recommended activity: activity as tolerated    Wound Care: foot care     If you experience any of these symptoms 24 hours or more after discharge:Chest pain,  Fever of 101 F. or greater or Weakness  please follow up with your PCP: Dr. Mayford KnifeWilliams [phone]

## 2014-07-19 NOTE — Progress Notes (Signed)
Inpatient Endocrinology Follow Up Note.    Initial Consult Question: DKA, hyperglycemia     Interval History:  Follow up of blood glucoses and insulin requirements. Blood glucoses running: 138-277 overnight and this morning. Currently on Lantus 20 units in the AM and 35 units in the PM, Humalog 15 units + moderate scale correction dose.     Patient feels confident about insulin administration and blood glucose monitoring at home.       Active Hospital Medications:  Current Facility-Administered Medications   Medication Dose Route Frequency    insulin lispro (HumaLOG) injection 0-30 Units  0-30 Units Subcutaneous TID WC    dextrose (GLUTOSE) 40 % oral gel 15 g  15 g Oral PRN    Dextrose 50 % solution 25 g  25 g Intravenous PRN    glucagon (GLUCAGEN) injection 1 mg  1 mg Intramuscular PRN    insulin glargine (LANTUS) injection 20 Units  20 Units Subcutaneous QAM    insulin glargine (LANTUS) injection 35 Units  35 Units Subcutaneous Nightly    benzonatate (TESSALON) capsule 200 mg  200 mg Oral TID PRN    lidocaine (LIDODERM) 5 % patch 1 patch  1 patch Transdermal Q24H    sodium chloride 0.9 % flush 10 mL  10 mL Intracatheter Q8H PRN    sodium chloride 0.9 % flush 10 mL  10 mL Intracatheter PRN    losartan (COZAAR) tablet 50 mg  50 mg Oral Daily    vancomycin (VANCOCIN) 1,500 mg in sodium chloride 0.9 % 275 mL IVPB  1,500 mg Intravenous Q8H    Influenza vaccine (FLUARIX) syringe 0.37mL - Quad, PF, for patients aged 3 years and older  0.5 mL Intramuscular Prior to discharge -vaccine    phenol-menthol (CEPASTAT) lozenge 1 lozenge  1 lozenge Buccal Q2H PRN    guaiFENesin (ROBITUSSIN) 100 MG/5ML syrup 200 mg  200 mg Oral Q6H PRN    enoxaparin (LOVENOX) injection 40 mg  40 mg Subcutaneous Daily    acetaminophen (TYLENOL) tablet 650 mg  650 mg Oral Q4H PRN    ondansetron (ZOFRAN) injection 4 mg  4 mg Intravenous Q6H PRN    dextrose (GLUTOSE) 40 % oral gel 15 g  15 g Oral PRN    Dextrose 50 % solution 25 g   25 g Intravenous PRN    glucagon (GLUCAGEN) injection 1 mg  1 mg Intramuscular PRN       Physical Examination:   CONSTITUTIONAL:   Filed Vitals:    07/19/14 0833   BP: 165/80   Pulse:    Temp:    Resp:    Height:    Weight:    He is well developed, well nourished, no acute distress  HEENT: PERLA, EOMI, no lid lag, pink conjunctiva, no proptosis.  NEUROLOGICAL: Alert and oriented x 3  ENDOCRINE: No supraclavicular fat pads, facial plethora, moon facies, abdominal striae, thyromegaly.        Assessment: This is a 44 y.o. male with uncontrolled diabetes requiring insulin with improved blood glucoses overnight, but still with elevated fasting glucoses this AM due to insulin resistance and insulin insufficiency. Patient to be discharged today.     Education: patient and wife confident about MDI insulin dosing, treatment of hypoglycemia, and importance of follow up for continued insulin management in [place].     Plan:   1. Medication adjustments:  - Lantus 20 units in AM and 40 units in PM   - Humalog 15 units with meals +  moderate correction dose   - Will schedule with endocrine in [place] per wife     Please feel free to call me with any questions or concerns. Pager# [phone]      Costella HatcherNDREA Macdonald Rigor, NP  07/19/2014  12:12 PM    Costella HatcherAndrea Aleina Burgio NP BC-ADM   Department of Endocrinology  [address], [address]  [place], [place] [address]  Phone: [phone]  Fax: [phone]  Pager: [phone]

## 2014-07-19 NOTE — Discharge Summary (Signed)
Pt d/c to home under care of wife.  [place]  I.V. Vanco delivered by [facility] from [place].  PICC dressing and wound care supplies provided.      Archie EndoVilai Nilani Hugill, RN

## 2014-07-19 NOTE — Progress Notes (Addendum)
Writer spoke with patient and wife in regards to d/c home. [place]. Patient will be getting I.V antibiotics for next 6 weeks, [employer] will be supplying medication and supplies to patient, patient will travel with enough medication and supplies for a week. [place]'s PCP in [place] will follow labs and therapy for medication. Vanco trough and BMP labs will be done on Monday 07/24/14 in [place]. Patient's wife will be primary person giving patient I.V infusions. Patient received new glucometer and insulin pens, education and use of insulin pens explained to patient and wife from [facility] nursing,patient has demonstrated understanding. D/C paperwork has been completed with patient and wife by Vi RN.    Dulce SellarImani Grantley Savage BSN RN   Care Coordinator pager [phone]

## 2014-07-19 NOTE — Discharge Summary (Addendum)
Name: Dakota Mcfarland MRN: [id number] DOB: 08/22/1970     Admit Date: 07/12/2014   Date of Discharge: 07/19/2014    Patient was accepted for discharge to   Home or Self Care [1]     Discharge Attending Physician: Larey DaysFERRANTINO, Nihar Klus      Hospitalization Summary    CONCISE NARRATIVE:   44 year old male with history of uncontrolled DM 2, who [facility] for further management of MRSA bacteremia. [place] in [place] and was visiting relatives for the holidays. He presented after a week of non-productive cough, fevers, chills, rigors, nausea, and vomiting. He was found to have 4/4 blood cultures growing MRSA. He was started on IV vancomycin. TTE, although a limited study, was without vegetations. The etiology of his bacteremia was attributed possibly to plantar callouses, which were debrided at the outside hospital. X-ray reportedly did not show evidence of osteomyelitis. Patient was transferred to [facility] for TEE and further work up/management. ID was consulted. TEE showed a vegetation on the tricuspid valve. Last blood cxs drawn on 07/15/14 are without growth to date. Patient received a PICC line on 07/17/13 for home antibiotics. Plan is to continue vanco for 6 weeks since first negative blood cultures (07/15/14). Goal trough is between 15-20. Patient should follow-up with his primary care physician and infectious disease MD in [place]. He needs weekly CBC with diff, BMP, and vanco trough. Teaching for home antibiotic administration was performed.    Patient was also seen by endocrinology for management of uncontrolled DM. He received DM teaching, he will go home on insulin, and is advised to follow up with endocrinology as an outpatient. HbA1c was 11.5.    He was started on losartan for uncontrolled hypertension.    He should follow up with podiatry for management of plantar wounds, and continue local wound care.        CARDIAC TESTING:   TEE:   1. Findings consistent with tricuspid  valve vegetation.  2. Normal biventricular size and function  3. Estimated left ventricular ejection fraction is 65%.  4. Mild tricuspid valve regurgitation  5. No prior study available for comparison    XRAY RESULTS:   CXR: 07/13/14:  1. Findings concerning for early pneumonia in the left lower lobe.  2. Hypoventilatory lungs. Increased vascularity gain noted which may indicate mild edema or maybe exaggerated by hypoventilation.     PENDING BLOOD RESULTS: Follow up final blood cx: drawn 07/15/13   SIGNIFICANT MED CHANGES: Yes  New Medications:  Lantus 20 units daily, lantus 40 units nightly  Humalog Sliding Scale administer three times daily before meals, with 15 units nutritional baseline.   Losartan 100 mg PO daily   Vancomycin 1.5 g IV q8 hours  Lidoderm patch    Discontinued medications:  Metformin        CONSULTANT SERVICE   FINE, STEVEN M Infectious Diseases   PEDULLA, ANGELO Cardiology   Costella HatcherAVIDANO, ANDREA Endocrinology                  Signed: Evalee Jeffersonhelsey Wallace, PA  On: 07/19/2014  at: 1:23 PM

## 2014-07-19 NOTE — Plan of Care (Signed)
Cognitive function     Cognitive function will be maintained or return to baseline Progressing towards goal        Impaired Gas Exchange     Patient will maintain adequate oxygenation Progressing towards goal        Ineffective Airway Clearance (Aspiration Precautions)     Patient will maintain patent airway Progressing towards goal        Ineffective Breathing Pattern     Patient will achieve/maintain normal respiratory rate/effort Progressing towards goal        Mobility. Lorra HalsBonnie J Lexii Walsh, RN       Patient's functional status is maintained or improved Progressing towards goal        Nutrition     Patient's nutritional status is maintained or improved Progressing towards goal        Pain/Comfort     Patient's pain or discomfort is manageable Progressing towards goal        Potential Risk for infection     Pt will be free of infection Progressing towards goal        Psychosocial     Demonstrates ability to cope with illness Progressing towards goal        Safety     Patient will remain free of falls Progressing towards goal     Prevent any intentional injury Progressing towards goal

## 2014-07-19 NOTE — Plan of Care (Signed)
Wound Wednesday Skin Assessment  Admission Braden: 22  Current Braden: 21  Q2 hour turns: self  Foley catheter: no  Stool management device: no  Mattress: regular  See below image for full skin assessment.

## 2014-07-20 LAB — BLOOD CULTURE
Bacterial Blood Culture: 0
Bacterial Blood Culture: 0

## 2014-07-24 DIAGNOSIS — E1042 Type 1 diabetes mellitus with diabetic polyneuropathy: Secondary | ICD-10-CM | POA: Insufficient documentation

## 2014-08-01 DIAGNOSIS — E7800 Pure hypercholesterolemia, unspecified: Secondary | ICD-10-CM | POA: Insufficient documentation

## 2014-08-23 DIAGNOSIS — J189 Pneumonia, unspecified organism: Secondary | ICD-10-CM | POA: Insufficient documentation

## 2014-09-19 DIAGNOSIS — K219 Gastro-esophageal reflux disease without esophagitis: Secondary | ICD-10-CM | POA: Insufficient documentation

## 2015-04-27 DIAGNOSIS — L03119 Cellulitis of unspecified part of limb: Secondary | ICD-10-CM | POA: Insufficient documentation

## 2015-04-27 DIAGNOSIS — L02619 Cutaneous abscess of unspecified foot: Secondary | ICD-10-CM | POA: Insufficient documentation

## 2015-05-22 DIAGNOSIS — M86671 Other chronic osteomyelitis, right ankle and foot: Secondary | ICD-10-CM | POA: Insufficient documentation

## 2015-12-14 ENCOUNTER — Ambulatory Visit: Payer: Self-pay

## 2015-12-14 ENCOUNTER — Encounter: Payer: Self-pay | Admitting: Sports Medicine

## 2015-12-14 ENCOUNTER — Ambulatory Visit (INDEPENDENT_AMBULATORY_CARE_PROVIDER_SITE_OTHER): Payer: BC Managed Care – PPO | Admitting: Sports Medicine

## 2015-12-14 VITALS — BP 113/81 | HR 88 | Temp 98.5°F | Resp 16

## 2015-12-14 DIAGNOSIS — L03119 Cellulitis of unspecified part of limb: Secondary | ICD-10-CM

## 2015-12-14 DIAGNOSIS — E1142 Type 2 diabetes mellitus with diabetic polyneuropathy: Secondary | ICD-10-CM

## 2015-12-14 DIAGNOSIS — L97521 Non-pressure chronic ulcer of other part of left foot limited to breakdown of skin: Secondary | ICD-10-CM

## 2015-12-14 DIAGNOSIS — L89891 Pressure ulcer of other site, stage 1: Secondary | ICD-10-CM

## 2015-12-14 DIAGNOSIS — M79672 Pain in left foot: Secondary | ICD-10-CM

## 2015-12-14 DIAGNOSIS — L02619 Cutaneous abscess of unspecified foot: Secondary | ICD-10-CM

## 2015-12-14 NOTE — Progress Notes (Signed)
Patient ID: Keith [name], male   DOB: [birth year], 45 y.o.   MRN: [id number] Subjective: Keith [name] is a 45 y.o. male patient seen in office for evaluation of ulceration of the Left foot. Patient has a history of diabetes and a blood glucose level today of 291 mg/dl.   Patient is changing the dressing using peroxide at home. Admits to a similar problem on his right foot 1 year ago. Denies nausea/fever/vomiting/chills/night sweats/shortness of breath/pain. Patient has no other pedal complaints at this time.  There are no active problems to display for this patient.  No current outpatient prescriptions on file prior to visit.   No current facility-administered medications on file prior to visit.   Allergies  Allergen Reactions  . Daptomycin   . Lisinopril     No results found for this or any previous visit (from the past 2160 hour(s)).  Objective: There were no vitals filed for this visit.  General: Patient is awake, alert, oriented x 3 and in no acute distress.  Dermatology: Skin is warm and dry bilateral with a partial thickness ulceration present  Sub met 1 left foot. Ulceration measures 3cm x 2cm x 0.2cm. There is a  Keratotic border with a granular base. The ulceration does not  probe to bone. There is no malodor, mild active serosangious drainage, mild dusky focal erythema, no edema. No other acute signs of infection.   Vascular: Dorsalis Pedis pulse = 2/4 Bilateral,  Posterior Tibial pulse = 1/4 Bilateral,  Capillary Fill Time < 5 seconds  Neurologic: Protective sensation absent to the level of midfoot using  the 5.07/10g [employer].  Musculosketal: No Pain with palpation to ulcerated area. No pain with compression to calves bilateral. No gross bony deformities noted bilateral.   Assessment and Plan:  Problem List Items Addressed This Visit    None    Visit Diagnoses    Left foot pain    -  Primary    Ulcer of foot, left, limited to breakdown of skin  (HCC)        Relevant Orders    WOUND CULTURE ([facility] ONLY)    Cellulitis and abscess of foot, except toes        Diabetic polyneuropathy associated with type 2 diabetes mellitus (HCC)        Relevant Medications    gabapentin (NEURONTIN) 800 MG tablet    losartan (COZAAR) 50 MG tablet    atorvastatin (LIPITOR) 40 MG tablet    aspirin 81 MG tablet    insulin glargine (LANTUS) 100 UNIT/ML injection    insulin glargine (LANTUS) 100 UNIT/ML injection    insulin aspart (NOVOLOG) 100 UNIT/ML injection      -Examined patient and discussed the progression of the wound and treatment alternatives. - Excisionally dedbrided ulceration to healthy bleeding borders using a sterile tissue nipper. -Applied iodosorb and dry sterile dressing and instructed patient to continue with daily dressings at home consisting of betadine and bandaid/dry sterile dressing. -Cont with Bactrim and Doxycyline. Wound culture obtained today; will call patient with result if additional or change in antibiotics is warranted  -Encouraged patient to return to post op shoe - Advised patient to go to the ER or return to office if the wound worsens or if constitutional symptoms are present. -Patient to return to office in 1-2 weeks for follow up care and evaluation or sooner if problems arise. Will xray at next visit if not improved.   Landis Martins, DPM

## 2015-12-18 ENCOUNTER — Telehealth: Payer: Self-pay | Admitting: *Deleted

## 2015-12-18 ENCOUNTER — Telehealth: Payer: Self-pay | Admitting: Sports Medicine

## 2015-12-18 DIAGNOSIS — L089 Local infection of the skin and subcutaneous tissue, unspecified: Secondary | ICD-10-CM

## 2015-12-18 MED ORDER — VANCOMYCIN HCL 250 MG PO CAPS: 250.0000 mg | ORAL_CAPSULE | Freq: Four times a day (QID) | ORAL | Status: DC

## 2015-12-18 MED ORDER — CIPROFLOXACIN HCL 500 MG PO TABS: 500.0000 mg | ORAL_TABLET | Freq: Two times a day (BID) | ORAL | Status: DC

## 2015-12-18 NOTE — Telephone Encounter (Signed)
Pt's pharmacy asked if could switch to Vancomycin 125mg  2 capsules 4 times daily, because the 250mg  are not in stock and hard to find.  I okayed.

## 2015-12-18 NOTE — Telephone Encounter (Signed)
Wound culture + E. Facecalis and E. Coli.  Started PO Vanco and Cipro

## 2015-12-28 ENCOUNTER — Encounter: Payer: Self-pay | Admitting: Sports Medicine

## 2015-12-28 ENCOUNTER — Ambulatory Visit (INDEPENDENT_AMBULATORY_CARE_PROVIDER_SITE_OTHER): Payer: BC Managed Care – PPO | Admitting: Sports Medicine

## 2015-12-28 DIAGNOSIS — M79672 Pain in left foot: Secondary | ICD-10-CM

## 2015-12-28 DIAGNOSIS — L02619 Cutaneous abscess of unspecified foot: Secondary | ICD-10-CM

## 2015-12-28 DIAGNOSIS — L97521 Non-pressure chronic ulcer of other part of left foot limited to breakdown of skin: Secondary | ICD-10-CM | POA: Diagnosis not present

## 2015-12-28 DIAGNOSIS — L03119 Cellulitis of unspecified part of limb: Secondary | ICD-10-CM

## 2015-12-28 DIAGNOSIS — E1142 Type 2 diabetes mellitus with diabetic polyneuropathy: Secondary | ICD-10-CM

## 2015-12-28 DIAGNOSIS — L89891 Pressure ulcer of other site, stage 1: Secondary | ICD-10-CM

## 2015-12-28 NOTE — Progress Notes (Signed)
Patient ID: Keith [name], male   DOB: Oct 16, 1970, 45 y.o.   MRN: [id number]   Subjective: Keith [name] is a 46 y.o. male patient seen in office for follow up evaluation of ulceration of the Left foot. Patient has a history of diabetes and a blood glucose level today wasn't recorded but is "better" per patient. Patient is changing the dressing using betadine at home. Patient was started on Cipro and Vanco for + culture of E. Coli and Facecalis; Denies nausea/fever/vomiting/chills/night sweats/shortness of breath/pain. Patient has no other pedal complaints at this time.  There are no active problems to display for this patient.  Current Outpatient Prescriptions on File Prior to Visit  Medication Sig Dispense Refill  . aspirin 81 MG tablet Take 81 mg by mouth daily.    Marland Kitchen atorvastatin (LIPITOR) 40 MG tablet Take 40 mg by mouth daily.    . ciprofloxacin (CIPRO) 500 MG tablet Take 1 tablet (500 mg total) by mouth 2 (two) times daily. 20 tablet 0  . gabapentin (NEURONTIN) 800 MG tablet Take 1,200 mg by mouth 3 (three) times daily.    . insulin aspart (NOVOLOG) 100 UNIT/ML injection Inject into the skin 3 (three) times daily before meals.    . insulin glargine (LANTUS) 100 UNIT/ML injection Inject 80 Units into the skin at bedtime.    . insulin glargine (LANTUS) 100 UNIT/ML injection Inject 40 Units into the skin at bedtime.    Marland Kitchen losartan (COZAAR) 50 MG tablet Take 50 mg by mouth daily.    . vancomycin (VANCOCIN) 250 MG capsule Take 1 capsule (250 mg total) by mouth 4 (four) times daily. 40 capsule 0   No current facility-administered medications on file prior to visit.   Allergies  Allergen Reactions  . Daptomycin   . Lisinopril     No results found for this or any previous visit (from the past 2160 hour(s)).  Objective: There were no vitals filed for this visit.  General: Patient is awake, alert, oriented x 3 and in no acute distress.  Dermatology: Skin is warm and dry bilateral with a  partial thickness ulceration present  Sub met 1 left foot. Ulceration measures 0.3cm x 0.2cm x 0.2cm (last measurement 3cmx2cmx0.2cm). There is a Keratotic border with a granular base. The ulceration does not  probe to bone. There is no malodor, no active serosangious drainage, no dusky focal erythema, no edema. No other acute signs of infection.   Vascular: Dorsalis Pedis pulse = 2/4 Bilateral,  Posterior Tibial pulse = 1/4 Bilateral,  Capillary Fill Time < 5 seconds  Neurologic: Protective sensation absent to the level of midfoot using  the 5.07/10g [employer].  Musculosketal: No Pain with palpation to ulcerated area. No pain with compression to calves bilateral. No gross bony deformities noted bilateral.   Assessment and Plan:  Problem List Items Addressed This Visit    None    Visit Diagnoses    Ulcer of foot, left, limited to breakdown of skin ([place])    -  Primary    Cellulitis and abscess of foot, except toes        Diabetic polyneuropathy associated with type 2 diabetes mellitus (HCC)        Left foot pain          -Examined patient and discussed the progression of the wound and treatment alternatives. - Excisionally dedbrided ulceration to healthy bleeding borders using a sterile tissue nipper. -Applied iodosorb and dry sterile dressing and instructed patient  to continue with daily dressings at home consisting of betadine and bandaid/dry sterile dressing. -Continue antibiotics until completed -Encouraged patient to cont with post op shoe daily until healing is achieved -Advised patient to go to the ER or return to office if the wound worsens or if constitutional symptoms are present. -Work note: Return in 1 week -Patient to return to office in 3 weeks for follow up care and evaluation or sooner if problems arise.  Keith [name], DPM

## 2015-12-31 ENCOUNTER — Encounter: Payer: Self-pay | Admitting: Sports Medicine

## 2016-01-18 ENCOUNTER — Ambulatory Visit: Payer: BC Managed Care – PPO | Admitting: Sports Medicine

## 2016-01-18 ENCOUNTER — Encounter: Payer: Self-pay | Admitting: Sports Medicine

## 2016-03-24 ENCOUNTER — Ambulatory Visit (INDEPENDENT_AMBULATORY_CARE_PROVIDER_SITE_OTHER): Payer: BC Managed Care – PPO | Admitting: Podiatry

## 2016-03-24 ENCOUNTER — Other Ambulatory Visit: Payer: Self-pay | Admitting: Podiatry

## 2016-03-24 ENCOUNTER — Encounter: Payer: Self-pay | Admitting: Podiatry

## 2016-03-24 VITALS — BP 133/87 | HR 97 | Ht 72.0 in | Wt 230.0 lb

## 2016-03-24 DIAGNOSIS — L97501 Non-pressure chronic ulcer of other part of unspecified foot limited to breakdown of skin: Principal | ICD-10-CM

## 2016-03-24 DIAGNOSIS — E08621 Diabetes mellitus due to underlying condition with foot ulcer: Secondary | ICD-10-CM

## 2016-03-24 DIAGNOSIS — L89891 Pressure ulcer of other site, stage 1: Secondary | ICD-10-CM

## 2016-03-24 DIAGNOSIS — L089 Local infection of the skin and subcutaneous tissue, unspecified: Secondary | ICD-10-CM

## 2016-03-24 MED ORDER — CIPROFLOXACIN HCL 500 MG PO TABS: 500.0000 mg | ORAL_TABLET | Freq: Two times a day (BID) | ORAL | 0 refills | Status: DC

## 2016-03-24 MED ORDER — VANCOMYCIN HCL 250 MG PO CAPS: 250.0000 mg | ORAL_CAPSULE | Freq: Four times a day (QID) | ORAL | 0 refills | Status: DC

## 2016-03-24 NOTE — Progress Notes (Signed)
Keith [name] presents today with a chief complaint of an ulceration to the plantar aspect of the forefoot left. He is an insulin-dependent diabetic who has not been taking his medication nor has he been checking his blood sugars. He states that he knows he's not doing what he supposed to be doing and takes full responsibility. He states that the ulceration is been present for the last several weeks and is only worsening. He states that he smells like it did before and he is starting to develop pain along the medial aspect of the foot and into the leg.  Objective: Vital signs are stable alert and oriented 3. Pulses are strongly palpable. Neurologic sensorium is intact. Deep tendon reflexes are intact. Muscle strength is normal. Pulses are palpable to the left foot and ulceration sub-first metatarsophalangeal joint extending to the level of the first intermetatarsal space distally. There is purulence and malodor and one abscess that probes deep but does not probe to bone. I resected all the necrotic tissue today samples were taken to be sent for pathologic evaluation i.e. culture and sensitivity.  Assessment: Diabetic ulceration without cellulitis.  Plan: Culture and sensitivity taken today wound to bleeding. He will start his wound healing regimen which she has at home. I also started him on his previous medications vancomycin and Cipro. Also instructed for him to wear his Darco shoe. I'll follow-up with him in 1 week he will watch for signs and symptoms of infection i.e. systemic infection fever chills nausea vomiting. He will report to the emergency Department if these should arise.

## 2016-03-29 LAB — WOUND CULTURE

## 2016-03-31 ENCOUNTER — Emergency Department: Payer: BC Managed Care – PPO

## 2016-03-31 ENCOUNTER — Encounter: Payer: Self-pay | Admitting: Podiatry

## 2016-03-31 ENCOUNTER — Encounter: Payer: Self-pay | Admitting: Medical Oncology

## 2016-03-31 ENCOUNTER — Observation Stay
Admission: EM | Admit: 2016-03-31 | Discharge: 2016-04-04 | Disposition: A | Discharge status: Home / Self Care | Payer: BC Managed Care – PPO | Attending: Internal Medicine | Admitting: Internal Medicine

## 2016-03-31 ENCOUNTER — Ambulatory Visit (INDEPENDENT_AMBULATORY_CARE_PROVIDER_SITE_OTHER): Payer: BC Managed Care – PPO | Admitting: Podiatry

## 2016-03-31 ENCOUNTER — Telehealth: Payer: Self-pay | Admitting: Sports Medicine

## 2016-03-31 VITALS — BP 190/112 | HR 90

## 2016-03-31 DIAGNOSIS — Z888 Allergy status to other drugs, medicaments and biological substances status: Secondary | ICD-10-CM | POA: Diagnosis not present

## 2016-03-31 DIAGNOSIS — E08621 Diabetes mellitus due to underlying condition with foot ulcer: Secondary | ICD-10-CM

## 2016-03-31 DIAGNOSIS — B951 Streptococcus, group B, as the cause of diseases classified elsewhere: Secondary | ICD-10-CM | POA: Diagnosis not present

## 2016-03-31 DIAGNOSIS — E11621 Type 2 diabetes mellitus with foot ulcer: Principal | ICD-10-CM | POA: Insufficient documentation

## 2016-03-31 DIAGNOSIS — L089 Local infection of the skin and subcutaneous tissue, unspecified: Secondary | ICD-10-CM | POA: Diagnosis present

## 2016-03-31 DIAGNOSIS — Z7982 Long term (current) use of aspirin: Secondary | ICD-10-CM | POA: Diagnosis not present

## 2016-03-31 DIAGNOSIS — Z79899 Other long term (current) drug therapy: Secondary | ICD-10-CM | POA: Diagnosis not present

## 2016-03-31 DIAGNOSIS — E785 Hyperlipidemia, unspecified: Secondary | ICD-10-CM | POA: Insufficient documentation

## 2016-03-31 DIAGNOSIS — B9689 Other specified bacterial agents as the cause of diseases classified elsewhere: Secondary | ICD-10-CM | POA: Diagnosis not present

## 2016-03-31 DIAGNOSIS — T148XXA Other injury of unspecified body region, initial encounter: Secondary | ICD-10-CM

## 2016-03-31 DIAGNOSIS — L97501 Non-pressure chronic ulcer of other part of unspecified foot limited to breakdown of skin: Secondary | ICD-10-CM

## 2016-03-31 DIAGNOSIS — L0889 Other specified local infections of the skin and subcutaneous tissue: Secondary | ICD-10-CM | POA: Diagnosis not present

## 2016-03-31 DIAGNOSIS — R739 Hyperglycemia, unspecified: Secondary | ICD-10-CM | POA: Diagnosis present

## 2016-03-31 DIAGNOSIS — R262 Difficulty in walking, not elsewhere classified: Secondary | ICD-10-CM

## 2016-03-31 DIAGNOSIS — I10 Essential (primary) hypertension: Secondary | ICD-10-CM | POA: Diagnosis not present

## 2016-03-31 DIAGNOSIS — L97421 Non-pressure chronic ulcer of left heel and midfoot limited to breakdown of skin: Secondary | ICD-10-CM | POA: Diagnosis not present

## 2016-03-31 DIAGNOSIS — Z881 Allergy status to other antibiotic agents status: Secondary | ICD-10-CM | POA: Diagnosis not present

## 2016-03-31 DIAGNOSIS — E1140 Type 2 diabetes mellitus with diabetic neuropathy, unspecified: Secondary | ICD-10-CM | POA: Diagnosis not present

## 2016-03-31 DIAGNOSIS — E1165 Type 2 diabetes mellitus with hyperglycemia: Secondary | ICD-10-CM | POA: Insufficient documentation

## 2016-03-31 DIAGNOSIS — T148 Other injury of unspecified body region: Secondary | ICD-10-CM | POA: Diagnosis present

## 2016-03-31 DIAGNOSIS — D649 Anemia, unspecified: Secondary | ICD-10-CM | POA: Diagnosis not present

## 2016-03-31 DIAGNOSIS — Z794 Long term (current) use of insulin: Secondary | ICD-10-CM | POA: Diagnosis not present

## 2016-03-31 DIAGNOSIS — L97519 Non-pressure chronic ulcer of other part of right foot with unspecified severity: Secondary | ICD-10-CM | POA: Diagnosis present

## 2016-03-31 DIAGNOSIS — L97509 Non-pressure chronic ulcer of other part of unspecified foot with unspecified severity: Secondary | ICD-10-CM

## 2016-03-31 HISTORY — DX: Essential (primary) hypertension: I10

## 2016-03-31 LAB — BASIC METABOLIC PANEL
Anion gap: 9 (ref 5–15)
BUN: 14 mg/dL (ref 6–20)
CO2: 26 mmol/L (ref 22–32)
CREATININE: 1.21 mg/dL (ref 0.61–1.24)
Calcium: 9.4 mg/dL (ref 8.9–10.3)
Chloride: 103 mmol/L (ref 101–111)
GFR calc Af Amer: >60 mL/min (ref >60)
GLUCOSE: 465 mg/dL — AB (ref 65–99)
Potassium: 4.4 mmol/L (ref 3.5–5.1)
SODIUM: 138 mmol/L (ref 135–145)

## 2016-03-31 LAB — CBC WITH DIFFERENTIAL/PLATELET
Basophils Absolute: 0 10*3/uL (ref 0–0.1)
Basophils Relative: 1 %
EOS ABS: 0.1 10*3/uL (ref 0–0.7)
EOS PCT: 2 %
HCT: 36.3 % — ABNORMAL LOW (ref 40.0–52.0)
Hemoglobin: 12.9 g/dL — ABNORMAL LOW (ref 13.0–18.0)
LYMPHS ABS: 0.9 10*3/uL — AB (ref 1.0–3.6)
Lymphocytes Relative: 17 %
MCH: 29.6 pg (ref 26.0–34.0)
MCHC: 35.6 g/dL (ref 32.0–36.0)
MCV: 83.3 fL (ref 80.0–100.0)
MONO ABS: 0.3 10*3/uL (ref 0.2–1.0)
MONOS PCT: 5 %
Neutro Abs: 4.3 10*3/uL (ref 1.4–6.5)
Neutrophils Relative %: 75 %
PLATELETS: 214 10*3/uL (ref 150–440)
RBC: 4.35 MIL/uL — ABNORMAL LOW (ref 4.40–5.90)
RDW: 13 % (ref 11.5–14.5)
WBC: 5.7 10*3/uL (ref 3.8–10.6)

## 2016-03-31 LAB — GLUCOSE, CAPILLARY: GLUCOSE-CAPILLARY: 351 mg/dL — AB (ref 65–99)

## 2016-03-31 MED ORDER — CLINDAMYCIN PHOSPHATE 600 MG/50ML IV SOLN
600.0000 mg | Freq: Once | INTRAVENOUS | Status: AC
  Administered 2016-03-31: 600 mg via INTRAVENOUS
  Filled 2016-03-31: qty 50

## 2016-03-31 MED ORDER — PIPERACILLIN-TAZOBACTAM 3.375 G IVPB 30 MIN
3.3750 g | Freq: Once | INTRAVENOUS | Status: AC
  Administered 2016-03-31: 3.375 g via INTRAVENOUS
  Filled 2016-03-31: qty 50

## 2016-03-31 MED ORDER — SODIUM CHLORIDE 0.9 % IV BOLUS (SEPSIS)
1000.0000 mL | Freq: Once | INTRAVENOUS | Status: AC
  Administered 2016-03-31: 1000 mL via INTRAVENOUS

## 2016-03-31 MED ORDER — INSULIN ASPART 100 UNIT/ML ~~LOC~~ SOLN
SUBCUTANEOUS | Status: AC
  Filled 2016-03-31: qty 6

## 2016-03-31 MED ORDER — INSULIN ASPART 100 UNIT/ML ~~LOC~~ SOLN
6.0000 [IU] | Freq: Once | SUBCUTANEOUS | Status: AC
  Administered 2016-03-31: 6 [IU] via INTRAVENOUS

## 2016-03-31 MED ORDER — INSULIN ASPART 100 UNIT/ML ~~LOC~~ SOLN
6.0000 [IU] | Freq: Once | SUBCUTANEOUS | Status: AC
  Administered 2016-03-31: 6 [IU] via INTRAVENOUS
  Filled 2016-03-31: qty 6

## 2016-03-31 NOTE — ED Provider Notes (Signed)
[facility] Emergency Department Provider Note    First MD Initiated Contact with Patient 03/31/16 2004     (approximate)  I have reviewed the triage vital signs and the nursing notes.   HISTORY  Chief Complaint Wound Infection    HPI Keith [name] is a 45 y.o. male history of poor control diabetes presents with worsening of a chronic left diabetic foot ulcer with progression of cellulitis to the anterior shin over the past 4 days. Patient was seen by his podiatrist who states that he is concern for deep space infection despite taking outpatient antibiotics, Cipro. Patient denies any fevers, nausea or vomiting. Does not describe any pain to the left foot as he has had peripheral neuropathy secondary to diabetes. Denies any other complaints at this time.   Past Medical History:  Diagnosis Date  . Diabetes mellitus without complication (HCC)   . Hypertension   . Neuropathy (HCC)     There are no active problems to display for this patient.   No past surgical history on file.  Prior to Admission medications   Medication Sig Start Date End Date Taking? Authorizing Provider  aspirin 81 MG tablet Take 81 mg by mouth daily.    Historical Provider, MD  atorvastatin (LIPITOR) 40 MG tablet Take 40 mg by mouth daily.    Historical Provider, MD  ciprofloxacin (CIPRO) 500 MG tablet Take 1 tablet (500 mg total) by mouth 2 (two) times daily. 03/24/16   Max T Hyatt, DPM  gabapentin (NEURONTIN) 800 MG tablet Take 1,200 mg by mouth 3 (three) times daily.    Historical Provider, MD  insulin aspart (NOVOLOG) 100 UNIT/ML injection Inject into the skin 3 (three) times daily before meals.    Historical Provider, MD  insulin glargine (LANTUS) 100 UNIT/ML injection Inject 80 Units into the skin at bedtime.    Historical Provider, MD  insulin glargine (LANTUS) 100 UNIT/ML injection Inject 40 Units into the skin at bedtime.    Historical Provider, MD  losartan (COZAAR) 50 MG  tablet Take 50 mg by mouth daily.    Historical Provider, MD  silver sulfADIAZINE (SILVADENE) 1 % cream Apply topically. 05/04/15 05/03/16  Historical Provider, MD  vancomycin (VANCOCIN) 125 MG capsule TK 2 CS PO QID 12/19/15   Historical Provider, MD  vancomycin (VANCOCIN) 250 MG capsule Take 1 capsule (250 mg total) by mouth 4 (four) times daily. 03/24/16   Max T Hyatt, DPM    Allergies Daptomycin and Lisinopril  No family history on file.  Social History Social History  Substance Use Topics  . Smoking status: Never Smoker  . Smokeless tobacco: Not on file  . Alcohol use Not on file    Review of Systems Patient denies headaches, rhinorrhea, blurry vision, numbness, shortness of breath, chest pain, edema, cough, abdominal pain, nausea, vomiting, diarrhea, dysuria, fevers, rashes or hallucinations unless otherwise stated above in HPI. ____________________________________________   PHYSICAL EXAM:  VITAL SIGNS: Vitals:   03/31/16 2230 03/31/16 2242  BP: (!) 155/88   Pulse:  82  Resp:  20  Temp:      Constitutional: Alert and oriented. Well appearing and in no acute distress. Eyes: Conjunctivae are normal. PERRL. EOMI. Head: Atraumatic. Nose: No congestion/rhinnorhea. Mouth/Throat: Mucous membranes are moist.  Oropharynx non-erythematous. Neck: No stridor. Painless ROM. No cervical spine tenderness to palpation Hematological/Lymphatic/Immunilogical: No cervical lymphadenopathy. Cardiovascular: Normal rate, regular rhythm. Grossly normal heart sounds.  Good peripheral circulation. Respiratory: Normal respiratory effort.  No retractions.  Lungs CTAB. Gastrointestinal: Soft and nontender. No distention. No abdominal bruits. No CVA tenderness. Genitourinary:  Musculoskeletal: No lower extremity tenderness nor edema.  He is a area of purulent drainage and edematous tissue in between the first and second toe of the left foot with erythema extending roughly 3 cm proximally along the  dorsum of the left foot. He also has a area of cellulitis on the anterior left lower extremity. Neurologic:  Normal speech and language. No gross focal neurologic deficits are appreciated. No gait instability. Skin:  Acute infection as described above Psychiatric: Mood and affect are normal. Speech and behavior are normal.  ____________________________________________   LABS (all labs ordered are listed, but only abnormal results are displayed)  Results for orders placed or performed during the hospital encounter of 03/31/16 (from the past 24 hour(s))  CBC with Differential     Status: Abnormal   Collection Time: 03/31/16  4:40 PM  Result Value Ref Range   WBC 5.7 3.8 - 10.6 K/uL   RBC 4.35 (L) 4.40 - 5.90 MIL/uL   Hemoglobin 12.9 (L) 13.0 - 18.0 g/dL   HCT 16.136.3 (L) 09.640.0 - 04.552.0 %   MCV 83.3 80.0 - 100.0 fL   MCH 29.6 26.0 - 34.0 pg   MCHC 35.6 32.0 - 36.0 g/dL   RDW 40.913.0 81.111.5 - 91.414.5 %   Platelets 214 150 - 440 K/uL   Neutrophils Relative % 75 %   Neutro Abs 4.3 1.4 - 6.5 K/uL   Lymphocytes Relative 17 %   Lymphs Abs 0.9 (L) 1.0 - 3.6 K/uL   Monocytes Relative 5 %   Monocytes Absolute 0.3 0.2 - 1.0 K/uL   Eosinophils Relative 2 %   Eosinophils Absolute 0.1 0 - 0.7 K/uL   Basophils Relative 1 %   Basophils Absolute 0.0 0 - 0.1 K/uL  Basic metabolic panel     Status: Abnormal   Collection Time: 03/31/16  4:40 PM  Result Value Ref Range   Sodium 138 135 - 145 mmol/L   Potassium 4.4 3.5 - 5.1 mmol/L   Chloride 103 101 - 111 mmol/L   CO2 26 22 - 32 mmol/L   Glucose, Bld 465 (H) 65 - 99 mg/dL   BUN 14 6 - 20 mg/dL   Creatinine, Ser 7.821.21 0.61 - 1.24 mg/dL   Calcium 9.4 8.9 - 95.610.3 mg/dL   GFR calc non Af Amer >60 >60 mL/min   GFR calc Af Amer >60 >60 mL/min   Anion gap 9 5 - 15   ____________________________________________  EKG____________________________________________  RADIOLOGY  Age uncertain small avulsion along the dorsal aspect of the navicular. No bony  destruction or erosion. No demonstrable soft tissue abscess or radiopaque foreign body. There are calcaneal spurs. There is arterial vascular calcification consistent with diabetes mellitus. ____________________________________________   PROCEDURES  Procedure(s) performed: none    Critical Care performed: no ____________________________________________   INITIAL IMPRESSION / ASSESSMENT AND PLAN / ED COURSE  Pertinent labs & imaging results that were available during my care of the patient were reviewed by me and considered in my medical decision making (see chart for details).  DDX: abscess, cellulitis, diabetic ulcer, nsti  [place] E Delford FieldWright is a 45 y.o. who presents to the ED with acute on chronic diabetic wound with surrounding worsening cellulitis. Patient also with acute hyperglycemia. His wound infection does appear to be actively draining and is not consistent with an abscess or necrotizing soft tissue infection. I spoke with the patient's podiatrist regarding  his presentation and based on his failure of outpatient antibiotics do feel that several doses of IV antibiotics chronically indicated as well as tight glycemic control.  Have discussed with the patient and available family all diagnostics and treatments performed thus far and all questions were answered to the best of my ability. The patient demonstrates understanding and agreement with plan.   Clinical Course     ____________________________________________   FINAL CLINICAL IMPRESSION(S) / ED DIAGNOSES  Final diagnoses:  Wound infection (HCC)  Hyperglycemia      NEW MEDICATIONS STARTED DURING THIS VISIT:  New Prescriptions   No medications on file     Note:  This document was prepared using Dragon voice recognition software and may include unintentional dictation errors.    Willy Eddy, MD 03/31/16 2329

## 2016-03-31 NOTE — ED Triage Notes (Signed)
Pt has wound to left foot that has worsened, pt has hx of cellulitis of foot and has had to have surgery on area.

## 2016-03-31 NOTE — ED Notes (Signed)
X-ray at bedside

## 2016-03-31 NOTE — ED Notes (Signed)
Pt given boxed lunch after okayed with Dr. Roxan Hockeyobinson.

## 2016-03-31 NOTE — Progress Notes (Signed)
Keith [name] presents today for follow-up of his diabetic foot ulcer plantar aspect left foot. He states that I been taking the antibiotic cement foot seems to be getting worse. He states that he has started back taking his insulin but his blood sugar still running around 300. He denies fever chills nausea vomiting muscle aches and pains. He does state that he has recently started to feel pain between first and second metatarsals and has experienced some pain in his calf. He continues to take his vancomycin and ciprofloxacin.  Objective: His blood pressure is elevated today and he is tachycardic. he is alert and oriented 3. He presents today with an antalgic gait utilizing a Darco shoe.. Once his left shoe and sock were removed his foot is moderately edematous there appears to be a erupting abscess to the dorsal aspect between the first and second metatarsals and he has cellulitis extending to the level of his mid calf. His calf is warm to the touch and tender. The plantar aspect of the foot where the ulcer was present appears to be healing uneventfully however I think the small area that had a terminal track between these second and first metatarsal with more than likely an abscess and has now erupted. There is no malodor. Pathology report/culture and sensitivity demonstrated Streptococcus infection.  Assessment: Diabetic foot ulcer with cellulitis left.  Plan: Directed him to [facility] emergency department. However he declined stating that he would [place]. I recommended that he not drive that for that he go to the nearest hospital and be transferred if necessary he declined. I will follow-up with him as needed.

## 2016-03-31 NOTE — ED Notes (Signed)
Drawn and sent a rainbow and one set of blood cultures.

## 2016-03-31 NOTE — Telephone Encounter (Signed)
Discussed case with Dr. Roxan Hockeyobinson, ER physician. Advised IV antibiotics and local wound care. Once discharged patient to follow up in office.  -Dr Marylene LandStover

## 2016-03-31 NOTE — ED Notes (Signed)
MD at bedside. 

## 2016-04-01 ENCOUNTER — Observation Stay: Payer: BC Managed Care – PPO

## 2016-04-01 LAB — GLUCOSE, CAPILLARY
GLUCOSE-CAPILLARY: 167 mg/dL — AB (ref 65–99)
Glucose-Capillary: 303 mg/dL — ABNORMAL HIGH (ref 65–99)
Glucose-Capillary: 225 mg/dL — ABNORMAL HIGH (ref 65–99)
Glucose-Capillary: 239 mg/dL — ABNORMAL HIGH (ref 65–99)
Glucose-Capillary: 254 mg/dL — ABNORMAL HIGH (ref 65–99)
Glucose-Capillary: 215 mg/dL — ABNORMAL HIGH (ref 65–99)

## 2016-04-01 LAB — BASIC METABOLIC PANEL
Anion gap: 5 (ref 5–15)
BUN: 14 mg/dL (ref 6–20)
CO2: 25 mmol/L (ref 22–32)
Calcium: 8.3 mg/dL — ABNORMAL LOW (ref 8.9–10.3)
Chloride: 109 mmol/L (ref 101–111)
Creatinine, Ser: 0.89 mg/dL (ref 0.61–1.24)
GFR calc Af Amer: >60 mL/min (ref >60)
GFR calc non Af Amer: >60 mL/min (ref >60)
GLUCOSE: 277 mg/dL — AB (ref 65–99)
POTASSIUM: 3.9 mmol/L (ref 3.5–5.1)
Sodium: 139 mmol/L (ref 135–145)

## 2016-04-01 LAB — CBC
HEMATOCRIT: 35.2 % — AB (ref 40.0–52.0)
Hemoglobin: 12.2 g/dL — ABNORMAL LOW (ref 13.0–18.0)
MCH: 28.8 pg (ref 26.0–34.0)
MCHC: 34.7 g/dL (ref 32.0–36.0)
MCV: 82.9 fL (ref 80.0–100.0)
Platelets: 183 10*3/uL (ref 150–440)
RBC: 4.24 MIL/uL — ABNORMAL LOW (ref 4.40–5.90)
RDW: 13 % (ref 11.5–14.5)
WBC: 5 10*3/uL (ref 3.8–10.6)

## 2016-04-01 LAB — MAGNESIUM: Magnesium: 1.9 mg/dL (ref 1.7–2.4)

## 2016-04-01 LAB — PHOSPHORUS: Phosphorus: 3.3 mg/dL (ref 2.5–4.6)

## 2016-04-01 MED ORDER — CEFAZOLIN SODIUM-DEXTROSE 2-4 GM/100ML-% IV SOLN
2.0000 g | INTRAVENOUS | Status: DC
  Filled 2016-04-01 (×2): qty 100

## 2016-04-01 MED ORDER — MORPHINE SULFATE (PF) 2 MG/ML IV SOLN: 1.0000 mg | INTRAVENOUS | Status: DC | PRN

## 2016-04-01 MED ORDER — CHLORHEXIDINE GLUCONATE CLOTH 2 % EX PADS
6.0000 | MEDICATED_PAD | Freq: Once | CUTANEOUS | Status: AC
  Administered 2016-04-01: 6 via TOPICAL

## 2016-04-01 MED ORDER — INSULIN ASPART 100 UNIT/ML ~~LOC~~ SOLN
0.0000 [IU] | Freq: Every day | SUBCUTANEOUS | Status: DC
  Administered 2016-04-01 – 2016-04-03 (×3): 2 [IU] via SUBCUTANEOUS
  Filled 2016-04-01 (×3): qty 2

## 2016-04-01 MED ORDER — ZOLPIDEM TARTRATE 5 MG PO TABS: 5.0000 mg | ORAL_TABLET | Freq: Every evening | ORAL | Status: DC | PRN

## 2016-04-01 MED ORDER — INSULIN GLARGINE 100 UNIT/ML ~~LOC~~ SOLN
80.0000 [IU] | Freq: Every day | SUBCUTANEOUS | Status: DC
Start: 2016-04-01 — End: 2016-04-01
  Administered 2016-04-01: 80 [IU] via SUBCUTANEOUS
  Filled 2016-04-01 (×2): qty 0.8

## 2016-04-01 MED ORDER — BISACODYL 5 MG PO TBEC: 5.0000 mg | DELAYED_RELEASE_TABLET | Freq: Every day | ORAL | Status: DC | PRN

## 2016-04-01 MED ORDER — HYDROCODONE-ACETAMINOPHEN 5-325 MG PO TABS
1.0000 | ORAL_TABLET | ORAL | Status: DC | PRN
  Administered 2016-04-01: 1 via ORAL
  Filled 2016-04-01: qty 1
  Filled 2016-04-01: qty 2

## 2016-04-01 MED ORDER — SODIUM CHLORIDE 0.9 % IV SOLN
3.0000 g | Freq: Four times a day (QID) | INTRAVENOUS | Status: DC
  Administered 2016-04-01 – 2016-04-04 (×12): 3 g via INTRAVENOUS
  Filled 2016-04-01 (×17): qty 3

## 2016-04-01 MED ORDER — SENNOSIDES-DOCUSATE SODIUM 8.6-50 MG PO TABS: 1.0000 | ORAL_TABLET | Freq: Every evening | ORAL | Status: DC | PRN

## 2016-04-01 MED ORDER — SODIUM CHLORIDE 0.9 % IV SOLN
1500.0000 mg | Freq: Two times a day (BID) | INTRAVENOUS | Status: DC
  Administered 2016-04-01: 1500 mg via INTRAVENOUS
  Filled 2016-04-01 (×2): qty 1500

## 2016-04-01 MED ORDER — ACETAMINOPHEN 650 MG RE SUPP
650.0000 mg | Freq: Four times a day (QID) | RECTAL | Status: DC | PRN
Start: 2016-04-01 — End: 2016-04-04

## 2016-04-01 MED ORDER — LOSARTAN POTASSIUM 50 MG PO TABS
50.0000 mg | ORAL_TABLET | Freq: Every day | ORAL | Status: DC
  Administered 2016-04-01 – 2016-04-04 (×3): 50 mg via ORAL
  Filled 2016-04-01 (×3): qty 1

## 2016-04-01 MED ORDER — ATORVASTATIN CALCIUM 20 MG PO TABS
40.0000 mg | ORAL_TABLET | Freq: Every day | ORAL | Status: DC
  Administered 2016-04-03 – 2016-04-04 (×2): 40 mg via ORAL
  Filled 2016-04-01 (×2): qty 2

## 2016-04-01 MED ORDER — VANCOMYCIN HCL 10 G IV SOLR
1500.0000 mg | Freq: Once | INTRAVENOUS | Status: AC
  Administered 2016-04-01: 1500 mg via INTRAVENOUS
  Filled 2016-04-01: qty 1500

## 2016-04-01 MED ORDER — ENOXAPARIN SODIUM 40 MG/0.4ML ~~LOC~~ SOLN
40.0000 mg | SUBCUTANEOUS | Status: DC
  Administered 2016-04-02 – 2016-04-03 (×2): 40 mg via SUBCUTANEOUS
  Filled 2016-04-01 (×2): qty 0.4

## 2016-04-01 MED ORDER — GABAPENTIN 600 MG PO TABS
1200.0000 mg | ORAL_TABLET | Freq: Three times a day (TID) | ORAL | Status: DC
  Administered 2016-04-01 – 2016-04-04 (×8): 1200 mg via ORAL
  Filled 2016-04-01 (×8): qty 2

## 2016-04-01 MED ORDER — PIPERACILLIN-TAZOBACTAM 4.5 G IVPB
4.5000 g | Freq: Three times a day (TID) | INTRAVENOUS | Status: DC
  Administered 2016-04-01: 4.5 g via INTRAVENOUS
  Filled 2016-04-01 (×3): qty 100

## 2016-04-01 MED ORDER — ACETAMINOPHEN 325 MG PO TABS
650.0000 mg | ORAL_TABLET | Freq: Four times a day (QID) | ORAL | Status: DC | PRN
  Administered 2016-04-01 – 2016-04-02 (×2): 650 mg via ORAL
  Filled 2016-04-01 (×2): qty 2

## 2016-04-01 MED ORDER — INSULIN ASPART 100 UNIT/ML ~~LOC~~ SOLN
0.0000 [IU] | Freq: Three times a day (TID) | SUBCUTANEOUS | Status: DC
  Administered 2016-04-01: 5 [IU] via SUBCUTANEOUS
  Administered 2016-04-01: 3 [IU] via SUBCUTANEOUS
  Administered 2016-04-01: 8 [IU] via SUBCUTANEOUS
  Administered 2016-04-02: 3 [IU] via SUBCUTANEOUS
  Administered 2016-04-02: 2 [IU] via SUBCUTANEOUS
  Administered 2016-04-02 – 2016-04-03 (×2): 5 [IU] via SUBCUTANEOUS
  Administered 2016-04-03: 3 [IU] via SUBCUTANEOUS
  Administered 2016-04-03: 5 [IU] via SUBCUTANEOUS
  Administered 2016-04-04: 8 [IU] via SUBCUTANEOUS
  Filled 2016-04-01: qty 2
  Filled 2016-04-01: qty 8
  Filled 2016-04-01 (×3): qty 5
  Filled 2016-04-01: qty 3
  Filled 2016-04-01: qty 8
  Filled 2016-04-01 (×2): qty 3
  Filled 2016-04-01: qty 5

## 2016-04-01 MED ORDER — ASPIRIN 81 MG PO CHEW: 81.0000 mg | CHEWABLE_TABLET | Freq: Every day | ORAL | Status: DC

## 2016-04-01 MED ORDER — SODIUM CHLORIDE 0.9 % IV SOLN
INTRAVENOUS | Status: DC
  Administered 2016-04-01: 03:00:00 via INTRAVENOUS

## 2016-04-01 MED ORDER — INSULIN GLARGINE 100 UNIT/ML ~~LOC~~ SOLN
60.0000 [IU] | Freq: Every day | SUBCUTANEOUS | Status: DC
  Administered 2016-04-01: 60 [IU] via SUBCUTANEOUS
  Filled 2016-04-01 (×3): qty 0.6

## 2016-04-01 MED ORDER — INSULIN GLARGINE 100 UNIT/ML ~~LOC~~ SOLN
20.0000 [IU] | Freq: Every day | SUBCUTANEOUS | Status: DC
  Administered 2016-04-01: 20 [IU] via SUBCUTANEOUS
  Filled 2016-04-01 (×2): qty 0.2

## 2016-04-01 MED ORDER — ONDANSETRON HCL 4 MG/2ML IJ SOLN: 4.0000 mg | Freq: Four times a day (QID) | INTRAMUSCULAR | Status: DC | PRN

## 2016-04-01 MED ORDER — ONDANSETRON HCL 4 MG PO TABS: 4.0000 mg | ORAL_TABLET | Freq: Four times a day (QID) | ORAL | Status: DC | PRN

## 2016-04-01 MED ORDER — MAGNESIUM CITRATE PO SOLN
1.0000 | Freq: Once | ORAL | Status: DC | PRN
Start: 2016-04-01 — End: 2016-04-04
  Filled 2016-04-01: qty 296

## 2016-04-01 MED ORDER — CHLORHEXIDINE GLUCONATE CLOTH 2 % EX PADS
6.0000 | MEDICATED_PAD | Freq: Once | CUTANEOUS | Status: AC
  Administered 2016-04-02: 6 via TOPICAL

## 2016-04-01 NOTE — Progress Notes (Signed)
Inpatient Diabetes Program Recommendations  AACE/ADA: New Consensus Statement on Inpatient Glycemic Control (2015)  Target Ranges:  Prepandial:   less than 140 mg/dL      Peak postprandial:   less than 180 mg/dL (1-2 hours)      Critically ill patients:  140 - 180 mg/dL   Lab Results  Component Value Date   GLUCAP 225 (H) 04/01/2016    Review of Glycemic Control  Results for [place], [place] E (MRN [id number]) as of 04/01/2016 11:02  Ref. Range 03/31/2016 22:35 04/01/2016 01:11 04/01/2016 08:01  Glucose-Capillary Latest Ref Range: 65 - 99 mg/dL 409351 (H) 811303 (H) 914225 (H)    Diabetes history: Type 2, A1C pending Outpatient Diabetes medications: Lantus 80 units qhs, Lantus 20 units qam, Novolog 30-40 units tid   Current orders for Inpatient glycemic control: Lantus 80 units qhs, Lantus 20 units qam, Novolog 0-15 units tid, Novolog 0-5 units qhs  Inpatient Diabetes Program Recommendations:   When the patient is no longer NPO, please add Novolog 10 units tid with meals- continue Novolog correction as currently ordered.   Susette RacerJulie Maleeka Sabatino, RN, BA, MHA, CDE Diabetes Coordinator Inpatient Diabetes Program  [phone] (Team Pager) [phone] [facility] Office) 04/01/2016 11:12 AM

## 2016-04-01 NOTE — H&P (Signed)
[facility] - [place] @ [facility] Admission History and Physical [employer], D.O.  ---------------------------------------------------------------------------------------------------------------------   PATIENT NAME: Keith [name] MR#: [id number] DATE OF BIRTH: 1971-06-08 DATE OF ADMISSION: 03/31/2016 PRIMARY CARE PHYSICIAN: [facility]  REQUESTING/REFERRING PHYSICIAN: ED Dr. Roxan Hockey  CHIEF COMPLAINT: Chief Complaint  Patient presents with  . Wound Infection    HISTORY OF PRESENT ILLNESS: Keith Bertoni is a 45 y.o. male with a known history of Diabetes, hypertension, peripheral neuropathy was in a usual state of health until 4 days ago when he reports progressive worsening of chronic left interdigital diabetic foot ulcer. He reports that he's been following with podiatry who had him on oral Cipro as an outpatient. Patient was started to the emergency department by podiatry secondary to concern for tunneling or deep space infection between the dorsal and plantar aspect of the base of both left third toe patient reports numbness, tingling and tension in his left foot. He denies any pain secondary to peripheral neuropathy. He denies any fevers, chills. Reports that his blood sugars have not been well controlled  Otherwise there has been no change in status. Patient has been taking medication as prescribed and there has been no recent change in medication or diet.  There has been no recent illness, travel or sick contacts.    Patient denies fevers/chills, weakness, dizziness, chest pain, shortness of breath, N/V/C/D, abdominal pain, dysuria/frequency, changes in mental status.   EMS/ED COURSE:   Patient received IV clindamycin and Zosyn  PAST MEDICAL HISTORY: Past Medical History:  Diagnosis Date  . Diabetes mellitus without complication (HCC)   . Hypertension   . Neuropathy (HCC)       PAST SURGICAL HISTORY: History reviewed. No pertinent surgical history.     SOCIAL HISTORY: Social History  Substance Use Topics  . Smoking status: Never Smoker  . Smokeless tobacco: Never Used  . Alcohol use Not on file      FAMILY HISTORY: History reviewed. No pertinent family history.   MEDICATIONS AT HOME: Prior to Admission medications   Medication Sig Start Date End Date Taking? Authorizing Provider  aspirin 81 MG tablet Take 81 mg by mouth daily.   Yes Historical Provider, MD  atorvastatin (LIPITOR) 40 MG tablet Take 40 mg by mouth daily.   Yes Historical Provider, MD  ciprofloxacin (CIPRO) 500 MG tablet Take 1 tablet (500 mg total) by mouth 2 (two) times daily. 03/24/16  Yes Max T Hyatt, DPM  gabapentin (NEURONTIN) 800 MG tablet Take 1,200 mg by mouth 3 (three) times daily.   Yes Historical Provider, MD  ibuprofen (ADVIL,MOTRIN) 200 MG tablet Take 800 mg by mouth every 6 (six) hours as needed.   Yes Historical Provider, MD  insulin aspart (NOVOLOG) 100 UNIT/ML injection Inject 30-40 Units into the skin 3 (three) times daily before meals. Per sliding scale   Yes Historical Provider, MD  insulin glargine (LANTUS) 100 UNIT/ML injection Inject 80 Units into the skin at bedtime.   Yes Historical Provider, MD  insulin glargine (LANTUS) 100 UNIT/ML injection Inject 20 Units into the skin daily.    Yes Historical Provider, MD  losartan (COZAAR) 50 MG tablet Take 50 mg by mouth daily.   Yes Historical Provider, MD  silver sulfADIAZINE (SILVADENE) 1 % cream Apply topically. 05/04/15 05/03/16 Yes Historical Provider, MD  vancomycin (VANCOCIN) 250 MG capsule Take 1 capsule (250 mg total) by mouth 4 (four) times daily. 03/24/16  Yes Max Maud Deed, DPM      DRUG ALLERGIES:  Allergies  Allergen Reactions  . Daptomycin Shortness Of Breath    Breathing issues  . Lisinopril     Cough     REVIEW OF SYSTEMS: CONSTITUTIONAL: No fever/chills, fatigue, weakness, weight gain/loss, headache EYES: No blurry or double vision. ENT: No tinnitus, postnasal drip, redness or  soreness of the oropharynx. RESPIRATORY: No cough, wheeze, hemoptysis, dyspnea. CARDIOVASCULAR: No chest pain, orthopnea, palpitations, syncope. GASTROINTESTINAL: No nausea, vomiting, constipation, diarrhea, abdominal pain, hematemesis, melena or hematochezia. GENITOURINARY: No dysuria or hematuria. ENDOCRINE: No polyuria or nocturia. No heat or cold intolerance. HEMATOLOGY: No anemia, bruising, bleeding. INTEGUMENTARY: No rashes, ulcers, lesions. MUSCULOSKELETAL: No arthritis, swelling, gout. NEUROLOGIC: No numbness, tingling, weakness or ataxia. No seizure-type activity. PSYCHIATRIC: No anxiety, depression, insomnia.  PHYSICAL EXAMINATION: VITAL SIGNS: Blood pressure (!) 176/96, pulse 75, temperature 97.7 F (36.5 C), temperature source Oral, resp. rate 16, height 6' (1.829 m), weight 107 kg (235 lb 12.8 oz), SpO2 98 %.  GENERAL: 45 y.o.-year-old white male patient, well-developed, well-nourished lying in the bed in no acute distress.  Pleasant and cooperative.   HEENT: Head atraumatic, normocephalic. Pupils equal, round, reactive to light and accommodation. No scleral icterus. Extraocular muscles intact. Nares are patent. Oropharynx is clear. Mucus membranes moist. NECK: Supple, full range of motion. No JVD, no bruit heard. No thyroid enlargement, no tenderness, no cervical lymphadenopathy. CHEST: Normal breath sounds bilaterally. No wheezing, rales, rhonchi or crackles. No use of accessory muscles of respiration.  No reproducible chest wall tenderness.  CARDIOVASCULAR: S1, S2 normal. No murmurs, rubs, or gallops. Cap refill <2 seconds. ABDOMEN: Soft, nontender, nondistended. No rebound, guarding, rigidity. Normoactive bowel sounds present in all four quadrants. No organomegaly or mass. EXTREMITIES: Full range of motion. No pedal edema, cyanosis, or clubbing. NEUROLOGIC: Cranial nerves II through XII are grossly intact with no focal sensorimotor deficit. Muscle strength 5/5 in all  extremities. Sensation intact. Gait not checked. PSYCHIATRIC: The patient is alert and oriented x 3. Normal affect, mood, thought content. SKIN: Warm, dry, and intact without obvious rash, lesion, or ulcer with the exception of oval-shaped area of excoriation between the second and third toe of the left foot on the dorsal aspect in between the first and second toe on the plantar aspect. The left third toe is erythematous and edematous. There is associated serosanguineous drainage and erythema. Pulses are intact.  LABORATORY PANEL:  CBC  Recent Labs Lab 03/31/16 1640  WBC 5.7  HGB 12.9*  HCT 36.3*  PLT 214   ----------------------------------------------------------------------------------------------------------------- Chemistries  Recent Labs Lab 03/31/16 1640  NA 138  K 4.4  CL 103  CO2 26  GLUCOSE 465*  BUN 14  CREATININE 1.21  CALCIUM 9.4  MG 1.9   ------------------------------------------------------------------------------------------------------------------ Cardiac Enzymes No results for input(s): TROPONINI in the last 168 hours. ------------------------------------------------------------------------------------------------------------------  RADIOLOGY: Dg Foot 2 Views Left  Result Date: 03/31/2016 CLINICAL DATA:  Soft tissue wounds distally. History of diabetes mellitus. EXAM: LEFT FOOT - 2 VIEW COMPARISON:  None. FINDINGS: Frontal and lateral views obtained. There is evidence of a small avulsion along the dorsal aspect of the proximal navicular, age uncertain. No other evidence suggesting fracture. No dislocation. The joint spaces appear unremarkable. There is no erosive change or bony destruction. There is a soft tissue air or abscess. No evident radiopaque foreign body. There is arteriovascular calcification between the proximal first and second metatarsals. There are spurs arising from the posterior and inferior calcaneus. IMPRESSION: Age uncertain small avulsion  along the dorsal aspect of the navicular. No  bony destruction or erosion. No demonstrable soft tissue abscess or radiopaque foreign body. There are calcaneal spurs. There is arterial vascular calcification consistent with diabetes mellitus. Electronically Signed   By: Bretta Bang III M.D.   On: 03/31/2016 21:35    IMPRESSION AND PLAN:  This is a 45 y.o. male with a history of poorly controlled diabetes, hypertension, peripheral neuropathy now being admitted with: 1. Diabetic foot ulcer with cellulitis we'll admit per podiatry for observation, IV antibiotics, IV fluid, wound cultures. We'll request MRI to rule out osteomyelitis given the likelihood of a deep space infection and possible bone infection. We'll also request wound consult and podiatry consult for consideration of surgical intervention. 2. Hyperglycemia-hold oral hypoglycemics. Tight glucose control with insulin sliding skull coverage before meals and at bedtime 3. Anemia, chronic-monitor CBC 4. History of hypertension.-Continue Cozaar. Hold aspirin for consideration of OR 5. History of peripheral neuropathy-continue Neurontin 6. Hyperlipidemia-continue Lipitor   Diet/Nutrition: Heart healthy, carb controlled Fluids: IV normal saline DVT Px: Lovenox, SCDs and early ambulation Code Status: Full  All the records are reviewed and case discussed with ED provider. Management plans discussed with the patient and/or family who express understanding and agree with plan of care.   TOTAL TIME TAKING CARE OF THIS PATIENT: 60 minutes.   Jane Broughton D.O. on 04/01/2016 at 1:34 AM Between 7am to 6pm - Pager - [phone] After 6pm go to [email or web address] - Social research officer, government [facility] Office [phone] CC: Primary care physician; [facility]     Note: This dictation was prepared with Dragon dictation along with smaller phrase technology. Any transcriptional errors that result from this  process are unintentional.

## 2016-04-01 NOTE — Consult Note (Signed)
WOC Nurse wound consult note Reason for Consult: Neuropathic ulcer to left plantar foot and between great toe and second toe with erythema and edema to left third toe.  Podiatry consult has been ordered and an MRI was suggested.  Bedside RN following up on this.  Wound type:Infectious neuropathic ulcer Pressure Ulcer POA: Yes Measurement:Left plantar foot with 1 cm calloused nonintact lesion, extends up to between great toe and second toe with blistering, purulent discharge and erythema.  Wound WUJ:WJXBJbed:Ruddy red Drainage (amount, consistency, odor) Minimal purulent discharge Periwound:Erythema and edema Dressing procedure/placement/frequency:Cleanse wound to left plantar foot and between first, second and third toes with NS and pat gently dry.  Cut Aquacel Ag into strips to weave between toes and place on plantar foot.  Cover with 4x4 gauze and kerlix.  Change daily. Will not follow at this time.  Please re-consult if needed.  Maple HudsonKaren Liahna Brickner RN BSN [facility]ON Pager [phone]

## 2016-04-01 NOTE — Consult Note (Signed)
ORTHOPAEDIC CONSULTATION  REQUESTING PHYSICIAN: Alford Highland, MD  Chief Complaint: Left foot infection  HPI: Keith [name] is a 45 y.o. male who complains of  Infection left foot.  Seen outpt by podiatrist and sent to ER.  Ulcer present for several months to plantar foot.  Past Medical History:  Diagnosis Date  . Diabetes mellitus without complication (HCC)   . Hypertension   . Neuropathy (HCC)    History reviewed. No pertinent surgical history. Social History   Social History  . Marital status: Married    Spouse name: N/A  . Number of children: N/A  . Years of education: N/A   Social History Main Topics  . Smoking status: Never Smoker  . Smokeless tobacco: Never Used  . Alcohol use None  . Drug use: Unknown  . Sexual activity: Not Asked   Other Topics Concern  . None   Social History Narrative  . None   History reviewed. No pertinent family history. Allergies  Allergen Reactions  . Daptomycin Shortness Of Breath    Breathing issues  . Lisinopril     Cough   Prior to Admission medications   Medication Sig Start Date End Date Taking? Authorizing Provider  aspirin 81 MG tablet Take 81 mg by mouth daily.   Yes Historical Provider, MD  atorvastatin (LIPITOR) 40 MG tablet Take 40 mg by mouth daily.   Yes Historical Provider, MD  ciprofloxacin (CIPRO) 500 MG tablet Take 1 tablet (500 mg total) by mouth 2 (two) times daily. 03/24/16  Yes Max T Hyatt, DPM  gabapentin (NEURONTIN) 800 MG tablet Take 1,200 mg by mouth 3 (three) times daily.   Yes Historical Provider, MD  ibuprofen (ADVIL,MOTRIN) 200 MG tablet Take 800 mg by mouth every 6 (six) hours as needed.   Yes Historical Provider, MD  insulin aspart (NOVOLOG) 100 UNIT/ML injection Inject 30-40 Units into the skin 3 (three) times daily before meals. Per sliding scale   Yes Historical Provider, MD  insulin glargine (LANTUS) 100 UNIT/ML injection Inject 80 Units into the skin at bedtime.   Yes Historical Provider, MD   insulin glargine (LANTUS) 100 UNIT/ML injection Inject 20 Units into the skin daily.    Yes Historical Provider, MD  losartan (COZAAR) 50 MG tablet Take 50 mg by mouth daily.   Yes Historical Provider, MD  silver sulfADIAZINE (SILVADENE) 1 % cream Apply topically. 05/04/15 05/03/16 Yes Historical Provider, MD  vancomycin (VANCOCIN) 250 MG capsule Take 1 capsule (250 mg total) by mouth 4 (four) times daily. 03/24/16  Yes Max Maud Deed, DPM   Mr Foot Left Wo Contrast  Result Date: 04/01/2016 CLINICAL DATA:  Left foot diabetic ulcer. Pt states it has been bleeding for past 3-4 days between 1st and 2nd toes. EXAM: MRI OF THE LEFT FOOT WITHOUT CONTRAST TECHNIQUE: Multiplanar, multisequence MR imaging was performed. No intravenous contrast was administered. COMPARISON:  None. FINDINGS: Bones/Joint/Cartilage No marrow signal abnormality. No fracture or dislocation. No periosteal reaction or bone destruction. Normal alignment. No joint effusion. Ligaments Collateral ligaments are intact. Lisfranc ligament is intact. Anterior talofibular ligament and posterior talofibular ligaments are intact. Muscles and Tendons Muscles or normal. Flexor, extensor and peroneal tendons are intact. Soft tissue Soft tissue ulcer along the dorsal aspect between the first and second metatarsal heads. 16 mm complex T2 hyperintense fluid collection along the dorsal aspect of the first and second MTP joints likely reflecting a small abscess or hematoma. No soft tissue mass. IMPRESSION: 1. Soft tissue ulcer along  the dorsal aspect between the first and second metatarsal heads. 16 mm complex T2 hyperintense fluid collection along the dorsal aspect of the first and second MTP joints likely reflecting a small abscess or hematoma. 2. No osteomyelitis of the left forefoot. Electronically Signed   By: Elige KoHetal  Patel   On: 04/01/2016 12:51   Dg Foot 2 Views Left  Result Date: 03/31/2016 CLINICAL DATA:  Soft tissue wounds distally. History of  diabetes mellitus. EXAM: LEFT FOOT - 2 VIEW COMPARISON:  None. FINDINGS: Frontal and lateral views obtained. There is evidence of a small avulsion along the dorsal aspect of the proximal navicular, age uncertain. No other evidence suggesting fracture. No dislocation. The joint spaces appear unremarkable. There is no erosive change or bony destruction. There is a soft tissue air or abscess. No evident radiopaque foreign body. There is arteriovascular calcification between the proximal first and second metatarsals. There are spurs arising from the posterior and inferior calcaneus. IMPRESSION: Age uncertain small avulsion along the dorsal aspect of the navicular. No bony destruction or erosion. No demonstrable soft tissue abscess or radiopaque foreign body. There are calcaneal spurs. There is arterial vascular calcification consistent with diabetes mellitus. Electronically Signed   By: Bretta BangWilliam  Woodruff III M.D.   On: 03/31/2016 21:35    Positive ROS: All other systems have been reviewed and were otherwise negative with the exception of those mentioned in the HPI and as above.  12 point ROS was performed.  Physical Exam: General: Alert and oriented.  No apparent distress.  Vascular:  Left foot:Dorsalis Pedis:  present Posterior Tibial:  present  Right foot: Dorsalis Pedis:  present Posterior Tibial:  present  Neuro:absent protective sensation  Derm:Probing abscess to 1st webspace left foot with surrounding erythema and purulence.  No bone probed.  No lymphangitis.    Right foot without issue  Ortho/MS: Edema to left foot.  No pain.   Xray: negative for osteom  MRI: consistent with abscess, no osteo  Assessment: DM with abscess with neuropathy. Negative for osteo  Plan: I & D planned for tomorrow pm NPO p 6:30 am.  OK for liquid breakfast prior to this. D/W pt R/B/A/C and pt wife. On IV unasyn currently.  Will perform deep cultures.     Irean HongFowler, Japhet Morgenthaler A, DPM Cell [phone]  [phone]   04/01/2016 1:58 PM

## 2016-04-01 NOTE — Progress Notes (Signed)
Pharmacy Antibiotic Note  Keith [name] is a 45 y.o. male admitted on 03/31/2016 with diabetic foot infection .  Pharmacy has been consulted for Unasyn dosing.  Plan: Patient received clindamycin x1 in the ED followed by two doses of vancomycin and Zosyn. Patients outpatient cx showed susceptibility to Unasyn. Will initiate Unasyn 3g IV Q6h. Continue to follow wound cx.   Height: 6' (182.9 cm) Weight: 235 lb 12.8 oz (107 kg) IBW/kg (Calculated) : 77.6  Temp (24hrs), Avg:98 F (36.7 C), Min:97.7 F (36.5 C), Max:98.4 F (36.9 C)   Recent Labs Lab 03/31/16 1640 04/01/16 0511  WBC 5.7 5.0  CREATININE 1.21 0.89    Estimated Creatinine Clearance: 132.5 mL/min (by C-G formula based on SCr of 0.89 mg/dL).    Allergies  Allergen Reactions  . Daptomycin Shortness Of Breath    Breathing issues  . Lisinopril     Cough    Antimicrobials this admission: Clindamycin 9/18 >> 9/18 Vancomycin 9/19 >> 9/19 Zosyn 9/18 >>9/19 Unasyn 9/19 >>  Dose adjustments this admission:   Microbiology results: 9/18 Wound Cx: wound cx: gram + cocci in chains and   Thank you for allowing pharmacy to be a part of this patient's care.  Delsa BernKelly m Fuhrmann, PharmD 04/01/2016 1:34 PM

## 2016-04-01 NOTE — Progress Notes (Signed)
Patient ID: Keith E Cotugno, male   DOB: 10-01-1970, 45 y.o.   MRN: [id number]  [employer] PROGRESS NOTE  Keith [name] WUJ:[id number] DOB: Dec 29, 1970 DOA: 03/31/2016 PCP: [facility] PRIMARY CARE  HPI/Subjective: Patient states that he does not like to stick himself with insulin injection 6 times a day. There are times where he does not take his insulin. Patient can't feel his feet.  Objective: Vitals:   04/01/16 0825 04/01/16 1300  BP: (!) 149/83 (!) 162/68  Pulse: 67   Resp: 18   Temp: 97.8 F (36.6 C)     Filed Weights   03/31/16 1637 04/01/16 0115  Weight: 104.3 kg (230 lb) 107 kg (235 lb 12.8 oz)    ROS: Review of Systems  Constitutional: Negative for chills and fever.  Eyes: Negative for blurred vision.  Respiratory: Negative for cough and shortness of breath.   Cardiovascular: Negative for chest pain.  Gastrointestinal: Negative for abdominal pain, constipation, diarrhea, nausea and vomiting.  Genitourinary: Negative for dysuria.  Musculoskeletal: Negative for joint pain.  Neurological: Negative for dizziness and headaches.   Exam: Physical Exam  Constitutional: He is oriented to person, place, and time.  HENT:  Nose: No mucosal edema.  Mouth/Throat: No oropharyngeal exudate or posterior oropharyngeal edema.  Eyes: Conjunctivae, EOM and lids are normal. Pupils are equal, round, and reactive to light.  Neck: No JVD present. Carotid bruit is not present. No edema present. No thyroid mass and no thyromegaly present.  Cardiovascular: S1 normal and S2 normal.  Exam reveals no gallop.   No murmur heard. Pulses:      Dorsalis pedis pulses are 2+ on the right side, and 2+ on the left side.  Respiratory: No respiratory distress. He has no wheezes. He has no rhonchi. He has no rales.  GI: Soft. Bowel sounds are normal. There is no tenderness.  Musculoskeletal:       Right ankle: He exhibits no swelling.       Left ankle: He exhibits no swelling.  Lymphadenopathy:    He  has no cervical adenopathy.  Neurological: He is alert and oriented to person, place, and time. No cranial nerve deficit.  Skin: Skin is warm. Nails show no clubbing.  Left foot wrapped by Dr. Ether Griffins.  Psychiatric: He has a normal mood and affect.      Data Reviewed: Basic Metabolic Panel:  Recent Labs Lab 03/31/16 1640 04/01/16 0511  NA 138 139  K 4.4 3.9  CL 103 109  CO2 26 25  GLUCOSE 465* 277*  BUN 14 14  CREATININE 1.21 0.89  CALCIUM 9.4 8.3*  MG 1.9  --   PHOS 3.3  --    CBC:  Recent Labs Lab 03/31/16 1640 04/01/16 0511  WBC 5.7 5.0  NEUTROABS 4.3  --   HGB 12.9* 12.2*  HCT 36.3* 35.2*  MCV 83.3 82.9  PLT 214 183    CBG:  Recent Labs Lab 03/31/16 2235 04/01/16 0111 04/01/16 0801  GLUCAP 351* 303* 225*    Recent Results (from the past 240 hour(s))  WOUND CULTURE     Status: Abnormal   Collection Time: 03/24/16 12:00 AM  Result Value Ref Range Status   Gram Stain Result Final report  Final   Result 1 Comment  Final    Comment: Many white blood cells.   RESULT 2 Comment  Final    Comment: Many gram positive cocci.   Aerobic Bacterial Culture Final report (A)  Final   Result 1  Staphylococcus aureus (A)  Final    Comment: Moderate growth Based on resistance to penicillin and susceptibility to oxacillin this isolate would be susceptible to: * Penicillinase-stable penicillins; such as:     Cloxacillin     Dicloxacillin     Nafcillin * Beta-lactam/beta-lactamase inhibitor combinations; such as:     Amoxicillin-clavulanic acid     Ampicillin-sulbactam * Antistaphylococcal cephems; such as:     Cefaclor     Cefuroxime * Antistaphylococcal carbapenems; such as:     Imipenem     Meropenem    Result 2 Comment (A)  Final    Comment: Beta hemolytic Streptococcus, group B Heavy growth Penicillin and ampicillin are drugs of choice for treatment of beta-hemolytic streptococcal infections. Susceptibility testing of penicillins and other  beta-lactam agents approved by the FDA for treatment of beta-hemolytic streptococcal infections need not be performed routinely because nonsusceptible isolates are extremely rare in any beta-hemolytic streptococcus and have not been reported for Streptococcus pyogenes (group A). (CLSI 2011)    ANTIMICROBIAL SUSCEPTIBILITY Comment  Final    Comment:       ** S = Susceptible; I = Intermediate; R = Resistant **                    P = Positive; N = Negative             MICS are expressed in micrograms per mL    Antibiotic                 RSLT#1    RSLT#2    RSLT#3    RSLT#4 Ciprofloxacin                  S Clindamycin                    R Erythromycin                   R Gentamicin                     S Levofloxacin                   S Linezolid                      S Moxifloxacin                   S Oxacillin                      S Penicillin                     R Quinupristin/Dalfopristin      S Rifampin                       S Tetracycline                   R Trimethoprim/Sulfa             S Vancomycin                     S   Aerobic/Anaerobic Culture (surgical/deep wound)     Status: None (Preliminary result)   Collection Time: 03/31/16  8:55 PM  Result Value Ref Range Status   Specimen Description FOOT left  Final   Special Requests Normal  Final  Gram Stain   Final    FEW SQUAMOUS EPITHELIAL CELLS PRESENT FEW WBC PRESENT,BOTH PMN AND MONONUCLEAR FEW GRAM POSITIVE COCCI IN CHAINS IN PAIRS RARE YEAST    Culture   Final    TOO YOUNG TO READ Performed at [facility]    Report Status PENDING  Incomplete     Studies: Mr Foot Left Wo Contrast  Result Date: 04/01/2016 CLINICAL DATA:  Left foot diabetic ulcer. Pt states it has been bleeding for past 3-4 days between 1st and 2nd toes. EXAM: MRI OF THE LEFT FOOT WITHOUT CONTRAST TECHNIQUE: Multiplanar, multisequence MR imaging was performed. No intravenous contrast was administered. COMPARISON:  None. FINDINGS:  Bones/Joint/Cartilage No marrow signal abnormality. No fracture or dislocation. No periosteal reaction or bone destruction. Normal alignment. No joint effusion. Ligaments Collateral ligaments are intact. Lisfranc ligament is intact. Anterior talofibular ligament and posterior talofibular ligaments are intact. Muscles and Tendons Muscles or normal. Flexor, extensor and peroneal tendons are intact. Soft tissue Soft tissue ulcer along the dorsal aspect between the first and second metatarsal heads. 16 mm complex T2 hyperintense fluid collection along the dorsal aspect of the first and second MTP joints likely reflecting a small abscess or hematoma. No soft tissue mass. IMPRESSION: 1. Soft tissue ulcer along the dorsal aspect between the first and second metatarsal heads. 16 mm complex T2 hyperintense fluid collection along the dorsal aspect of the first and second MTP joints likely reflecting a small abscess or hematoma. 2. No osteomyelitis of the left forefoot. Electronically Signed   By: Elige Ko   On: 04/01/2016 12:51   Dg Foot 2 Views Left  Result Date: 03/31/2016 CLINICAL DATA:  Soft tissue wounds distally. History of diabetes mellitus. EXAM: LEFT FOOT - 2 VIEW COMPARISON:  None. FINDINGS: Frontal and lateral views obtained. There is evidence of a small avulsion along the dorsal aspect of the proximal navicular, age uncertain. No other evidence suggesting fracture. No dislocation. The joint spaces appear unremarkable. There is no erosive change or bony destruction. There is a soft tissue air or abscess. No evident radiopaque foreign body. There is arteriovascular calcification between the proximal first and second metatarsals. There are spurs arising from the posterior and inferior calcaneus. IMPRESSION: Age uncertain small avulsion along the dorsal aspect of the navicular. No bony destruction or erosion. No demonstrable soft tissue abscess or radiopaque foreign body. There are calcaneal spurs. There is  arterial vascular calcification consistent with diabetes mellitus. Electronically Signed   By: Bretta Bang III M.D.   On: 03/31/2016 21:35    Scheduled Meds: . ampicillin-sulbactam (UNASYN) IV  3 g Intravenous Q6H  . atorvastatin  40 mg Oral Daily  . [START ON 04/02/2016]  ceFAZolin (ANCEF) IV  2 g Intravenous On Call to OR  . Chlorhexidine Gluconate Cloth  6 each Topical Once   And  . Chlorhexidine Gluconate Cloth  6 each Topical Once  . enoxaparin (LOVENOX) injection  40 mg Subcutaneous Q24H  . gabapentin  1,200 mg Oral TID  . insulin aspart  0-15 Units Subcutaneous TID WC  . insulin aspart  0-5 Units Subcutaneous QHS  . insulin glargine  20 Units Subcutaneous Daily  . insulin glargine  80 Units Subcutaneous QHS  . losartan  50 mg Oral Daily    Assessment/Plan:  1. Diabetic foot ulcer and cellulitis. MRI showing small abscess versus hematoma and no signs of osteomyelitis. Switch antibiotics to Unasyn. Previous culture on 03/24/2016 growing staph aureus. Case discussed with Dr. Ether Griffins  to take to the operating room tomorrow afternoon for debridement 2. Uncontrolled type I.5 diabetes mellitus. Continue Lantus insulin and sliding scale. Check a hemoglobin A1c. Consideration of insulin pump as outpatient so the patient will not have to stick himself so often. 3. Hypertension on Cozaar 4. Diabetic neuropathy on Neurontin 5. Hyperlipidemia unspecified on Lipitor  Code Status:     Code Status Orders        Start     Ordered   04/01/16 0049  Full code  Continuous     04/01/16 0049    Code Status History    Date Active Date Inactive Code Status Order ID Comments User Context   This patient has a current code status but no historical code status.     Disposition Plan: Depending on how he does with his OR procedure tomorrow likely home in a few days  Consultants:  Podiatry  Antibiotics:  Unasyn  Time spent: 35 minutes  Alford Highland  [employer]

## 2016-04-01 NOTE — Progress Notes (Signed)
Pharmacy Antibiotic Note  Keith [name] is a 45 y.o. male admitted on 03/31/2016 with wound infection.  Pharmacy has been consulted for vancomycin and Zosyn dosing.  Plan: DW 88kg  Vd 62L kei 0.084 hr-1  t1/2 8 hours Vancomycin 1500 mg q 12 hours ordered with stacked dosing. Level before 5th dose. Goal trough 15-20.  Zosyn 4.5 grams q 8 hours ordered for Pseudomonas risk of recent abx as outpatient (Cipro.)  Height: 6' (182.9 cm) Weight: 230 lb (104.3 kg) IBW/kg (Calculated) : 77.6  Temp (24hrs), Avg:98.4 F (36.9 C), Min:98.4 F (36.9 C), Max:98.4 F (36.9 C)   Recent Labs Lab 03/31/16 1640  WBC 5.7  CREATININE 1.21    Estimated Creatinine Clearance: 96.3 mL/min (by C-G formula based on SCr of 1.21 mg/dL).    Allergies  Allergen Reactions  . Daptomycin Shortness Of Breath    Breathing issues  . Lisinopril     Cough    Antimicrobials this admission: vancomycin  >>  Zosyn  >>   Dose adjustments this admission: 9/18 Wound Cx: pending  Microbiology results:   Thank you for allowing pharmacy to be a part of this patient's care.  Jesper Stirewalt S 04/01/2016 1:14 AM

## 2016-04-02 ENCOUNTER — Encounter
Admission: EM | Disposition: A | Discharge status: Home / Self Care | Payer: Self-pay | Source: Home / Self Care | Attending: Student in an Organized Health Care Education/Training Program

## 2016-04-02 ENCOUNTER — Encounter: Payer: Self-pay | Admitting: Anesthesiology

## 2016-04-02 ENCOUNTER — Observation Stay: Payer: BC Managed Care – PPO | Admitting: Certified Registered"

## 2016-04-02 HISTORY — PX: IRRIGATION AND DEBRIDEMENT FOOT: SHX6602

## 2016-04-02 LAB — GLUCOSE, CAPILLARY
GLUCOSE-CAPILLARY: 236 mg/dL — AB (ref 65–99)
GLUCOSE-CAPILLARY: 120 mg/dL — AB (ref 65–99)
GLUCOSE-CAPILLARY: 228 mg/dL — AB (ref 65–99)
Glucose-Capillary: 193 mg/dL — ABNORMAL HIGH (ref 65–99)
Glucose-Capillary: 136 mg/dL — ABNORMAL HIGH (ref 65–99)
Glucose-Capillary: 146 mg/dL — ABNORMAL HIGH (ref 65–99)

## 2016-04-02 LAB — HEMOGLOBIN A1C
Hgb A1c MFr Bld: 13.9 % — ABNORMAL HIGH (ref 4.8–5.6)
MEAN PLASMA GLUCOSE: 352 mg/dL

## 2016-04-02 LAB — MRSA PCR SCREENING: MRSA BY PCR: NEGATIVE

## 2016-04-02 SURGERY — IRRIGATION AND DEBRIDEMENT FOOT
Anesthesia: General | Laterality: Left | Wound class: Dirty or Infected

## 2016-04-02 MED ORDER — INSULIN GLARGINE 100 UNIT/ML ~~LOC~~ SOLN
20.0000 [IU] | Freq: Every day | SUBCUTANEOUS | Status: DC
  Administered 2016-04-03 – 2016-04-04 (×3): 20 [IU] via SUBCUTANEOUS
  Filled 2016-04-02 (×4): qty 0.2

## 2016-04-02 MED ORDER — LIDOCAINE-EPINEPHRINE 1 %-1:100000 IJ SOLN
INTRAMUSCULAR | Status: AC
  Filled 2016-04-02: qty 1

## 2016-04-02 MED ORDER — PROPOFOL 10 MG/ML IV BOLUS
INTRAVENOUS | Status: DC | PRN
  Administered 2016-04-02: 160 mg via INTRAVENOUS

## 2016-04-02 MED ORDER — FENTANYL CITRATE (PF) 100 MCG/2ML IJ SOLN: 25.0000 ug | INTRAMUSCULAR | Status: DC | PRN

## 2016-04-02 MED ORDER — ONDANSETRON HCL 4 MG/2ML IJ SOLN
INTRAMUSCULAR | Status: DC | PRN
  Administered 2016-04-02: 4 mg via INTRAVENOUS

## 2016-04-02 MED ORDER — FENTANYL CITRATE (PF) 100 MCG/2ML IJ SOLN
INTRAMUSCULAR | Status: DC | PRN
  Administered 2016-04-02: 50 ug via INTRAVENOUS

## 2016-04-02 MED ORDER — LIDOCAINE HCL (CARDIAC) 20 MG/ML IV SOLN
INTRAVENOUS | Status: DC | PRN
  Administered 2016-04-02: 100 mg via INTRAVENOUS

## 2016-04-02 MED ORDER — BUPIVACAINE HCL (PF) 0.5 % IJ SOLN
INTRAMUSCULAR | Status: AC
  Filled 2016-04-02: qty 30

## 2016-04-02 MED ORDER — INSULIN GLARGINE 100 UNIT/ML ~~LOC~~ SOLN
80.0000 [IU] | Freq: Every day | SUBCUTANEOUS | Status: DC
  Administered 2016-04-02 – 2016-04-03 (×2): 80 [IU] via SUBCUTANEOUS
  Filled 2016-04-02 (×3): qty 0.8

## 2016-04-02 MED ORDER — MIDAZOLAM HCL 2 MG/2ML IJ SOLN
INTRAMUSCULAR | Status: DC | PRN
  Administered 2016-04-02: 2 mg via INTRAVENOUS

## 2016-04-02 MED ORDER — SODIUM CHLORIDE 0.9 % IV SOLN
INTRAVENOUS | Status: DC | PRN
  Administered 2016-04-02: 16:00:00 via INTRAVENOUS

## 2016-04-02 MED ORDER — LIDOCAINE HCL (PF) 1 % IJ SOLN
INTRAMUSCULAR | Status: AC
  Filled 2016-04-02: qty 30

## 2016-04-02 MED ORDER — CEFAZOLIN SODIUM-DEXTROSE 2-3 GM-% IV SOLR
INTRAVENOUS | Status: DC | PRN
  Administered 2016-04-02: 2 g via INTRAVENOUS

## 2016-04-02 MED ORDER — PROMETHAZINE HCL 25 MG/ML IJ SOLN
6.2500 mg | INTRAMUSCULAR | Status: DC | PRN
Start: 2016-04-02 — End: 2016-04-02

## 2016-04-02 MED ORDER — EPHEDRINE SULFATE 50 MG/ML IJ SOLN
INTRAMUSCULAR | Status: DC | PRN
  Administered 2016-04-02 (×2): 10 mg via INTRAVENOUS

## 2016-04-02 SURGICAL SUPPLY — 59 items
BANDAGE ACE 4X5 VEL STRL LF (GAUZE/BANDAGES/DRESSINGS) ×2 IMPLANT
BANDAGE STRETCH 3X4.1 STRL (GAUZE/BANDAGES/DRESSINGS) ×2 IMPLANT
BLADE OSC/SAGITTAL MD 5.5X18 (BLADE) IMPLANT
BLADE OSCILLATING/SAGITTAL (BLADE)
BLADE SW THK.38XMED LNG THN (BLADE) IMPLANT
BNDG COHESIVE 4X5 TAN STRL (GAUZE/BANDAGES/DRESSINGS) ×2 IMPLANT
BNDG COHESIVE 6X5 TAN STRL LF (GAUZE/BANDAGES/DRESSINGS) ×2 IMPLANT
BNDG ESMARK 4X12 TAN STRL LF (GAUZE/BANDAGES/DRESSINGS) IMPLANT
BNDG GAUZE 4.5X4.1 6PLY STRL (MISCELLANEOUS) ×2 IMPLANT
CANISTER SUCT 1200ML W/VALVE (MISCELLANEOUS) ×2 IMPLANT
CANISTER SUCT 3000ML (MISCELLANEOUS) ×2 IMPLANT
CUFF TOURN 18 STER (MISCELLANEOUS) ×2 IMPLANT
CUFF TOURN DUAL PL 12 NO SLV (MISCELLANEOUS) ×2 IMPLANT
DRAPE FLUOR MINI C-ARM 54X84 (DRAPES) IMPLANT
DRAPE XRAY CASSETTE 23X24 (DRAPES) IMPLANT
DRESSING ALLEVYN 4X4 (MISCELLANEOUS) IMPLANT
DURAPREP 26ML APPLICATOR (WOUND CARE) IMPLANT
ELECT REM PT RETURN 9FT ADLT (ELECTROSURGICAL) ×2
ELECTRODE REM PT RTRN 9FT ADLT (ELECTROSURGICAL) ×1 IMPLANT
GAUZE PACKING 1/4X5YD (GAUZE/BANDAGES/DRESSINGS) IMPLANT
GAUZE PACKING IODOFORM 1X5 (MISCELLANEOUS) ×2 IMPLANT
GAUZE PETRO XEROFOAM 1X8 (MISCELLANEOUS) ×2 IMPLANT
GAUZE SPONGE 4X4 12PLY STRL (GAUZE/BANDAGES/DRESSINGS) ×2 IMPLANT
GAUZE STRETCH 2X75IN STRL (MISCELLANEOUS) ×2 IMPLANT
GLOVE BIO SURGEON STRL SZ7.5 (GLOVE) ×2 IMPLANT
GLOVE INDICATOR 8.0 STRL GRN (GLOVE) ×2 IMPLANT
GOWN STRL REUS W/TWL MED LVL3 (GOWN DISPOSABLE) ×4 IMPLANT
HANDPIECE INTERPULSE COAX TIP (DISPOSABLE) ×1
HANDPIECE VERSAJET DEBRIDEMENT (MISCELLANEOUS) IMPLANT
IV NS 1000ML (IV SOLUTION) ×1
IV NS 1000ML BAXH (IV SOLUTION) ×1 IMPLANT
KIT RM TURNOVER STRD PROC AR (KITS) ×2 IMPLANT
LABEL OR SOLS (LABEL) ×2 IMPLANT
NEEDLE FILTER BLUNT 18X 1/2SAF (NEEDLE)
NEEDLE FILTER BLUNT 18X1 1/2 (NEEDLE) IMPLANT
NEEDLE HYPO 25X1 1.5 SAFETY (NEEDLE) ×2 IMPLANT
NS IRRIG 500ML POUR BTL (IV SOLUTION) ×2 IMPLANT
PACK EXTREMITY ARMC (MISCELLANEOUS) ×2 IMPLANT
PAD ABD DERMACEA PRESS 5X9 (GAUZE/BANDAGES/DRESSINGS) ×2 IMPLANT
RASP SM TEAR CROSS CUT (RASP) IMPLANT
SET HNDPC FAN SPRY TIP SCT (DISPOSABLE) ×1 IMPLANT
SOL .9 NS 3000ML IRR  AL (IV SOLUTION) ×1
SOL .9 NS 3000ML IRR UROMATIC (IV SOLUTION) ×1 IMPLANT
SOL PREP PVP 2OZ (MISCELLANEOUS) ×2
SOLUTION PREP PVP 2OZ (MISCELLANEOUS) ×1 IMPLANT
SPONGE XRAY 4X4 16PLY STRL (MISCELLANEOUS) ×2 IMPLANT
STOCKINETTE IMPERVIOUS 9X36 MD (GAUZE/BANDAGES/DRESSINGS) ×2 IMPLANT
SUT ETHILON 2 0 FS 18 (SUTURE) IMPLANT
SUT ETHILON 3-0 FS-10 30 BLK (SUTURE) ×2
SUT ETHILON 4-0 (SUTURE)
SUT ETHILON 4-0 FS2 18XMFL BLK (SUTURE)
SUT VIC AB 3-0 SH 27 (SUTURE)
SUT VIC AB 3-0 SH 27X BRD (SUTURE) IMPLANT
SUT VIC AB 4-0 FS2 27 (SUTURE) IMPLANT
SUTURE EHLN 3-0 FS-10 30 BLK (SUTURE) ×1 IMPLANT
SUTURE ETHLN 4-0 FS2 18XMF BLK (SUTURE) IMPLANT
SWAB CULTURE AMIES ANAERIB BLU (MISCELLANEOUS) IMPLANT
SYR 3ML LL SCALE MARK (SYRINGE) IMPLANT
SYRINGE 10CC LL (SYRINGE) ×4 IMPLANT

## 2016-04-02 NOTE — Progress Notes (Addendum)
Inpatient Diabetes Program Recommendations  AACE/ADA: New Consensus Statement on Inpatient Glycemic Control (2015)  Target Ranges:  Prepandial:   less than 140 mg/dL      Peak postprandial:   less than 180 mg/dL (1-2 hours)      Critically ill patients:  140 - 180 mg/dL   Results for [place], [place] E (MRN [id number]) as of 04/02/2016 11:26  Ref. Range 04/01/2016 08:01 04/01/2016 12:37 04/01/2016 15:24 04/01/2016 16:56 04/01/2016 20:46 04/02/2016 08:24  Glucose-Capillary Latest Ref Range: 65 - 99 mg/dL 409 (H)  Novolog 5 units 167 (H)  Novolog 3 units  Lantus 20 @ 13:08 units 239 (H)   254 (H)  Novolog 8 units 215 (H)  Novolog 2 units  Lantus 60 units @ 22:14 236 (H)  Novolog 5 units  Results for [place], [place] E (MRN [id number]) as of 04/02/2016 11:26  Ref. Range 04/01/2016 05:11  Hemoglobin A1C Latest Ref Range: 4.8 - 5.6 % 13.9 (H)   Review of Glycemic Control  Diabetes history: DM 1.5 (per Dr. Renae Gloss note on 04/01/16) Outpatient Diabetes medications: Lantus 20 units QAM, Lantus 80 units QHS, Novolog 30-40 units TID with meals Current orders for Inpatient glycemic control: Lantus 60 units QHS, Novolog 0-15 units TID with meals, Novolog 0-5 units QHS  Inpatient Diabetes Program Recommendations:  Insulin - Basal:  Patient received Lantus 20 units yesterday morning and Lantus 60 units last night. Fasting glucose is 236 mg/dl.  Please consider increasing Lantus to 80 units QHS (so patient will only get one dose of Lantus per day). Insulin - Meal Coverage: When diet is resumed, please consider ordering Novolog 8 units TID with meals for meal coverage. A1C: A1C 13.9% on 04/01/16 indicating an average glucose of 352 mg/dl over the past 2-3 months.  Addendum 04/02/16@14 :24-Spoke with patient about diabetes and home regimen for diabetes control. Patient reports that he is followed by PCP for diabetes management and currently he takes Lantus 20 units QAM, Lantus 80 units QHS, Novolog 30-40 units TID  with meals as an outpatient for diabetes control. Patient does admit that he frequently skips injections and admits that he has days he does not take insulin at all because he is "frustrated and sick and tired of giving myself shots".  Patient reports that he use to go the an Endocrinologist in [place] but has not seen her in a couple of years. Patient admits that he is strong willed and wants to do things his way but he knows he has to get his diabetes controlled. Discussed A1C results (13.9% on 04/01/16) and explained that his current A1C indicates an average glucose of 352 mg/dl over the past 2-3 months. Discussed glucose and A1C goals. Patient states that he had his A1C down to 6% about 1 1/2 years ago and he knows that he can get it down if he takes the insulin injections as prescribed. Patient would like to try other alternatives to taking 5-6 insulin injections per day.  Discussed insulin pumps, combination insulins, addition of other DM medications like GLP-1 RA (Trulicity uses once a week along with continuing insulin).  Discussed importance of checking CBGs and maintaining good CBG control to prevent long-term and short-term complications. Explained how hyperglycemia leads to damage within blood vessels which lead to the common complications seen with uncontrolled diabetes. Stressed to the patient the importance of improving glycemic control to prevent further complications from uncontrolled diabetes. Discussed impact of nutrition, exercise, stress, sickness, and medications on diabetes control. Provided emotional support  and encouraged the patient to check his glucose as recommended and to take his insulin as prescribed to get diabetes better controlled. Encouraged patient to follow back up with Endocrinologist to see if there are other options for DM control that would work better for him.  Patient was appreciative of information discussed and states that he will try to improve compliance with DM  medications and diet.  Patient verbalized understanding of information discussed and he states that he has no further questions at this time related to diabetes.  Thanks, Orlando PennerMarie Dicy Smigel, RN, MSN, CDE Diabetes Coordinator Inpatient Diabetes Program [phone] (Team Pager from 8am to 5pm) [phone] ([facility] office) [phone] [facility] office) [phone] [facility] office)

## 2016-04-02 NOTE — Transfer of Care (Signed)
Immediate Anesthesia Transfer of Care Note  Patient: Keith [name]  Procedure(s) Performed: Procedure(s): IRRIGATION AND DEBRIDEMENT FOOT (Left)  Patient Location: PACU  Anesthesia Type:General  Level of Consciousness: awake, alert , oriented and patient cooperative  Airway & Oxygen Therapy: Patient Spontanous Breathing and Patient connected to face mask oxygen  Post-op Assessment: Report given to RN, Post -op Vital signs reviewed and stable and Patient moving all extremities X 4  Post vital signs: Reviewed and stable  Last Vitals:  Vitals:   04/02/16 0802 04/02/16 1450  BP: (!) 153/75 138/89  Pulse: 63 99  Resp: 18 18  Temp: 36.8 C 36.2 C    Last Pain:  Vitals:   04/02/16 1450  TempSrc: Tympanic  PainSc:       Patients Stated Pain Goal: 2 (04/01/16 0045)  Complications: No apparent anesthesia complications

## 2016-04-02 NOTE — Op Note (Signed)
Operative note   Surgeon:Ayoub Arey    Assistant:none    Preop diagnosis:Left foot    Postop diagnosis:Same    Procedure:Incision and drainage left foot    EBL: Minimal    Anesthesia:general    Hemostasis: None    Specimen: Deep wound culture left foot    Complications: None    Operative indications:Keith [name] is an 45 y.o. that presents today for surgical intervention.  The risks/benefits/alternatives/complications have been discussed and consent has been given.    Procedure:  Patient was brought into the OR and placed on the operating table in thesupine position. After anesthesia was obtained theleft lower extremity was prepped and draped in usual sterile fashion.  Attention was directed to the first webspace where there was a noted large abscess on the dorsal aspect of the left foot with an open wound spreading from the plantar webspace to the dorsal webspace. Necrotic tissue was noted at this time and this was excised from the surgical field in toto. A small incision was made dorsally where the abscess was noted to track. No further tunneling was noted. No further purulent drainage was noted tracking proximal to the MTPJ. There was a superficial ulceration. I was able to debride this excisionally with a 15 blade down to good healthy bleeding tissue. This was limited to epidermal and skin excision. The web splitting opening was then excisionally debrided with a versa jet and infection and necrotic tissue were removed down to subcutaneous tissue. The wound was flushed with copious amounts or irrigation. 2 single sutures 0 nylon were placed in the central portion of the splitting of the webspace. This left room for dorsal and plantar range. The wound was then packed with half-inch Nu Gauze with iodoform. A large sterile bulky dressing was then applied. He was taken from the OR to the PACU with all vital signs stable vastus intact.    Patient tolerated the procedure and anesthesia  well.  Was transported from the OR to the PACU with all vital signs stable and vascular status intact. To be discharged per routine protocol.

## 2016-04-02 NOTE — Anesthesia Postprocedure Evaluation (Signed)
Anesthesia Post Note  Patient: Keith [name]  Procedure(s) Performed: Procedure(s) (LRB): IRRIGATION AND DEBRIDEMENT FOOT (Left)  Patient location during evaluation: PACU Anesthesia Type: General Level of consciousness: awake and alert Pain management: pain level controlled Vital Signs Assessment: post-procedure vital signs reviewed and stable Respiratory status: spontaneous breathing, nonlabored ventilation, respiratory function stable and patient connected to nasal cannula oxygen Cardiovascular status: blood pressure returned to baseline and stable Postop Assessment: no signs of nausea or vomiting Anesthetic complications: no    Last Vitals:  Vitals:   04/02/16 1758 04/02/16 1900  BP: 140/77 121/77  Pulse: 76 77  Resp: 20 20  Temp: 36.4 C 36.6 C    Last Pain:  Vitals:   04/02/16 1900  TempSrc: Oral  PainSc:                  Lenard SimmerAndrew Adasyn Mcadams

## 2016-04-02 NOTE — Progress Notes (Signed)
Patient ID: Keith [name], male   DOB: 01/31/1971, 45 y.o.   MRN: [id number]  [employer] PROGRESS NOTE  Keith E Westra ZJI:[id number] DOB: [birth year] DOA: 03/31/2016 PCP: [facility]  HPI/Subjective: Seen earlier. Feels well. Had bleeding from his foot with walking around last night.  Objective: Vitals:   04/02/16 0802 04/02/16 1450  BP: (!) 153/75 138/89  Pulse: 63 99  Resp: 18 18  Temp: 98.3 F (36.8 C) 97.1 F (36.2 C)    Filed Weights   03/31/16 1637 04/01/16 0115  Weight: 104.3 kg (230 lb) 107 kg (235 lb 12.8 oz)    ROS: Review of Systems  Constitutional: Negative for chills and fever.  Eyes: Negative for blurred vision.  Respiratory: Negative for cough and shortness of breath.   Cardiovascular: Negative for chest pain.  Gastrointestinal: Negative for abdominal pain, constipation, diarrhea, nausea and vomiting.  Genitourinary: Negative for dysuria.  Musculoskeletal: Negative for joint pain.  Neurological: Negative for dizziness and headaches.   Exam: Physical Exam  Constitutional: He is oriented to person, place, and time.  HENT:  Nose: No mucosal edema.  Mouth/Throat: No oropharyngeal exudate or posterior oropharyngeal edema.  Eyes: Conjunctivae, EOM and lids are normal. Pupils are equal, round, and reactive to light.  Neck: No JVD present. Carotid bruit is not present. No edema present. No thyroid mass and no thyromegaly present.  Cardiovascular: S1 normal and S2 normal.  Exam reveals no gallop.   No murmur heard. Pulses:      Dorsalis pedis pulses are 2+ on the right side, and 2+ on the left side.  Respiratory: No respiratory distress. He has no wheezes. He has no rhonchi. He has no rales.  GI: Soft. Bowel sounds are normal. There is no tenderness.  Musculoskeletal:       Right ankle: He exhibits no swelling.       Left ankle: He exhibits no swelling.  Lymphadenopathy:    He has no cervical adenopathy.  Neurological: He is alert and oriented to  person, place, and time. No cranial nerve deficit.  Skin: Skin is warm. Nails show no clubbing.  Since blood was seeping too dependent on the patient was going to the OR today did not unwrap his bandaged foot today  Psychiatric: He has a normal mood and affect.      Data Reviewed: Basic Metabolic Panel:  Recent Labs Lab 03/31/16 1640 04/01/16 0511  NA 138 139  K 4.4 3.9  CL 103 109  CO2 26 25  GLUCOSE 465* 277*  BUN 14 14  CREATININE 1.21 0.89  CALCIUM 9.4 8.3*  MG 1.9  --   PHOS 3.3  --    CBC:  Recent Labs Lab 03/31/16 1640 04/01/16 0511  WBC 5.7 5.0  NEUTROABS 4.3  --   HGB 12.9* 12.2*  HCT 36.3* 35.2*  MCV 83.3 82.9  PLT 214 183    CBG:  Recent Labs Lab 04/01/16 1656 04/01/16 2046 04/02/16 0824 04/02/16 1149 04/02/16 1506  GLUCAP 254* 215* 236* 193* 120*    Recent Results (from the past 240 hour(s))  WOUND CULTURE     Status: Abnormal   Collection Time: 03/24/16 12:00 AM  Result Value Ref Range Status   Gram Stain Result Final report  Final   Result 1 Comment  Final    Comment: Many white blood cells.   RESULT 2 Comment  Final    Comment: Many gram positive cocci.   Aerobic Bacterial Culture Final report (A)  Final   Result 1 Staphylococcus aureus (A)  Final    Comment: Moderate growth Based on resistance to penicillin and susceptibility to oxacillin this isolate would be susceptible to: * Penicillinase-stable penicillins; such as:     Cloxacillin     Dicloxacillin     Nafcillin * Beta-lactam/beta-lactamase inhibitor combinations; such as:     Amoxicillin-clavulanic acid     Ampicillin-sulbactam * Antistaphylococcal cephems; such as:     Cefaclor     Cefuroxime * Antistaphylococcal carbapenems; such as:     Imipenem     Meropenem    Result 2 Comment (A)  Final    Comment: Beta hemolytic Streptococcus, group B Heavy growth Penicillin and ampicillin are drugs of choice for treatment of beta-hemolytic streptococcal infections.  Susceptibility testing of penicillins and other beta-lactam agents approved by the FDA for treatment of beta-hemolytic streptococcal infections need not be performed routinely because nonsusceptible isolates are extremely rare in any beta-hemolytic streptococcus and have not been reported for Streptococcus pyogenes (group A). (CLSI 2011)    ANTIMICROBIAL SUSCEPTIBILITY Comment  Final    Comment:       ** S = Susceptible; I = Intermediate; R = Resistant **                    P = Positive; N = Negative             MICS are expressed in micrograms per mL    Antibiotic                 RSLT#1    RSLT#2    RSLT#3    RSLT#4 Ciprofloxacin                  S Clindamycin                    R Erythromycin                   R Gentamicin                     S Levofloxacin                   S Linezolid                      S Moxifloxacin                   S Oxacillin                      S Penicillin                     R Quinupristin/Dalfopristin      S Rifampin                       S Tetracycline                   R Trimethoprim/Sulfa             S Vancomycin                     S   Aerobic/Anaerobic Culture (surgical/deep wound)     Status: None (Preliminary result)   Collection Time: 03/31/16  8:55 PM  Result Value Ref Range Status   Specimen Description FOOT left  Final   Special Requests  Normal  Final   Gram Stain   Final    FEW SQUAMOUS EPITHELIAL CELLS PRESENT FEW WBC PRESENT,BOTH PMN AND MONONUCLEAR FEW GRAM POSITIVE COCCI IN CHAINS IN PAIRS RARE YEAST    Culture   Final    MODERATE GROUP B STREP(S.AGALACTIAE)ISOLATED TESTING AGAINST S. AGALACTIAE NOT ROUTINELY PERFORMED DUE TO PREDICTABILITY OF AMP/PEN/VAN SUSCEPTIBILITY. CULTURE REINCUBATED FOR BETTER GROWTH Virtually 100% of S. agalactiae (Group B) strains are susceptible to Penicillin.  For Penicillin-allergic patients, Erythromycin (85-95% sensitive) and Clindamycin (80% sensitive) are drugs of choice. Contact microbiology  lab to request sensitivities if  needed within 7 days. [facility]    Report Status PENDING  Incomplete  MRSA PCR Screening     Status: None   Collection Time: 04/02/16 11:00 AM  Result Value Ref Range Status   MRSA by PCR NEGATIVE NEGATIVE Final    Comment:        The GeneXpert MRSA Assay (FDA approved for NASAL specimens only), is one component of a comprehensive MRSA colonization surveillance program. It is not intended to diagnose MRSA infection nor to guide or monitor treatment for MRSA infections.      Studies: Mr Foot Left Wo Contrast  Result Date: 04/01/2016 CLINICAL DATA:  Left foot diabetic ulcer. Pt states it has been bleeding for past 3-4 days between 1st and 2nd toes. EXAM: MRI OF THE LEFT FOOT WITHOUT CONTRAST TECHNIQUE: Multiplanar, multisequence MR imaging was performed. No intravenous contrast was administered. COMPARISON:  None. FINDINGS: Bones/Joint/Cartilage No marrow signal abnormality. No fracture or dislocation. No periosteal reaction or bone destruction. Normal alignment. No joint effusion. Ligaments Collateral ligaments are intact. Lisfranc ligament is intact. Anterior talofibular ligament and posterior talofibular ligaments are intact. Muscles and Tendons Muscles or normal. Flexor, extensor and peroneal tendons are intact. Soft tissue Soft tissue ulcer along the dorsal aspect between the first and second metatarsal heads. 16 mm complex T2 hyperintense fluid collection along the dorsal aspect of the first and second MTP joints likely reflecting a small abscess or hematoma. No soft tissue mass. IMPRESSION: 1. Soft tissue ulcer along the dorsal aspect between the first and second metatarsal heads. 16 mm complex T2 hyperintense fluid collection along the dorsal aspect of the first and second MTP joints likely reflecting a small abscess or hematoma. 2. No osteomyelitis of the left forefoot. Electronically Signed   By: Elige KoHetal  Patel   On: 04/01/2016  12:51   Dg Foot 2 Views Left  Result Date: 03/31/2016 CLINICAL DATA:  Soft tissue wounds distally. History of diabetes mellitus. EXAM: LEFT FOOT - 2 VIEW COMPARISON:  None. FINDINGS: Frontal and lateral views obtained. There is evidence of a small avulsion along the dorsal aspect of the proximal navicular, age uncertain. No other evidence suggesting fracture. No dislocation. The joint spaces appear unremarkable. There is no erosive change or bony destruction. There is a soft tissue air or abscess. No evident radiopaque foreign body. There is arteriovascular calcification between the proximal first and second metatarsals. There are spurs arising from the posterior and inferior calcaneus. IMPRESSION: Age uncertain small avulsion along the dorsal aspect of the navicular. No bony destruction or erosion. No demonstrable soft tissue abscess or radiopaque foreign body. There are calcaneal spurs. There is arterial vascular calcification consistent with diabetes mellitus. Electronically Signed   By: Bretta BangWilliam  Woodruff III M.D.   On: 03/31/2016 21:35    Scheduled Meds: . [MAR Hold] ampicillin-sulbactam (UNASYN) IV  3 g Intravenous Q6H  . [MAR Hold] atorvastatin  40 mg Oral Daily  .  ceFAZolin (ANCEF) IV  2 g Intravenous On Call to OR  . [MAR Hold] enoxaparin (LOVENOX) injection  40 mg Subcutaneous Q24H  . [MAR Hold] gabapentin  1,200 mg Oral TID  . [MAR Hold] insulin aspart  0-15 Units Subcutaneous TID WC  . [MAR Hold] insulin aspart  0-5 Units Subcutaneous QHS  . [MAR Hold] insulin glargine  20 Units Subcutaneous Daily  . [MAR Hold] insulin glargine  80 Units Subcutaneous QHS  . [MAR Hold] losartan  50 mg Oral Daily    Assessment/Plan:  1. Diabetic foot ulcer and cellulitis. MRI showing small abscess versus hematoma and no signs of osteomyelitis. Switched antibiotics to Unasyn. Previous culture on 03/24/2016 growing staph aureus. Dr. Rondel Baton to the operating room this afternoon for  debridement. 2. Uncontrolled type I.5 diabetes mellitus. Continue Lantus insulin and sliding scale. Hemoglobin A1c elevated at 13.9. Consideration of insulin pump as outpatient so the patient will not have to stick himself so often. 3. Hypertension on Cozaar 4. Diabetic neuropathy on Neurontin 5. Hyperlipidemia unspecified on Lipitor  Code Status:     Code Status Orders        Start     Ordered   04/01/16 0049  Full code  Continuous     04/01/16 0049    Code Status History    Date Active Date Inactive Code Status Order ID Comments User Context   This patient has a current code status but no historical code status.     Disposition Plan: Will depend on Dr. Irene Limbo opinion on when to go home  Consultants:  Podiatry  Antibiotics:  Unasyn  Time spent: 25 minutes  Alford Highland  [employer]

## 2016-04-02 NOTE — Anesthesia Procedure Notes (Signed)
Procedure Name: LMA Insertion Date/Time: 04/02/2016 3:48 PM Performed by: Michaele OfferSAVAGE, Dawne Casali Pre-anesthesia Checklist: Patient identified, Emergency Drugs available, Suction available, Patient being monitored and Timeout performed Patient Re-evaluated:Patient Re-evaluated prior to inductionOxygen Delivery Method: Circle system utilized Preoxygenation: Pre-oxygenation with 100% oxygen Intubation Type: IV induction Ventilation: Mask ventilation without difficulty LMA: LMA inserted LMA Size: 5.0 Number of attempts: 1 Placement Confirmation: positive ETCO2 and breath sounds checked- equal and bilateral Tube secured with: Tape Dental Injury: Teeth and Oropharynx as per pre-operative assessment

## 2016-04-02 NOTE — Anesthesia Preprocedure Evaluation (Signed)
Anesthesia Evaluation  Patient identified by MRN, date of birth, ID band Patient awake    Reviewed: Allergy & Precautions, H&P , NPO status , Patient's Chart, lab work & pertinent test results, reviewed documented beta blocker date and time   History of Anesthesia Complications Negative for: history of anesthetic complications  Airway Mallampati: III  TM Distance: >3 FB Neck ROM: full    Dental no notable dental hx. (+) Poor Dentition, Chipped, Missing   Pulmonary neg pulmonary ROS,           Cardiovascular Exercise Tolerance: Good hypertension, (-) angina(-) CAD, (-) Past MI, (-) Cardiac Stents and (-) CABG (-) dysrhythmias (-) Valvular Problems/Murmurs     Neuro/Psych negative neurological ROS  negative psych ROS   GI/Hepatic negative GI ROS, Neg liver ROS,   Endo/Other  diabetes, Poorly Controlled, Insulin Dependent  Renal/GU negative Renal ROS  negative genitourinary   Musculoskeletal   Abdominal   Peds  Hematology negative hematology ROS (+)   Anesthesia Other Findings Past Medical History: No date: Diabetes mellitus without complication (HCC) No date: Hypertension No date: Neuropathy (HCC)   Reproductive/Obstetrics negative OB ROS                             Anesthesia Physical Anesthesia Plan  ASA: III  Anesthesia Plan: General   Post-op Pain Management:    Induction:   Airway Management Planned:   Additional Equipment:   Intra-op Plan:   Post-operative Plan:   Informed Consent: I have reviewed the patients History and Physical, chart, labs and discussed the procedure including the risks, benefits and alternatives for the proposed anesthesia with the patient or authorized representative who has indicated his/her understanding and acceptance.   Dental Advisory Given  Plan Discussed with: Anesthesiologist, CRNA and Surgeon  Anesthesia Plan Comments:          Anesthesia Quick Evaluation

## 2016-04-03 ENCOUNTER — Encounter: Payer: Self-pay | Admitting: Podiatry

## 2016-04-03 LAB — GLUCOSE, CAPILLARY
GLUCOSE-CAPILLARY: 191 mg/dL — AB (ref 65–99)
GLUCOSE-CAPILLARY: 204 mg/dL — AB (ref 65–99)
Glucose-Capillary: 231 mg/dL — ABNORMAL HIGH (ref 65–99)
Glucose-Capillary: 233 mg/dL — ABNORMAL HIGH (ref 65–99)
Glucose-Capillary: 232 mg/dL — ABNORMAL HIGH (ref 65–99)

## 2016-04-03 LAB — CBC
HCT: 36.5 % — ABNORMAL LOW (ref 40.0–52.0)
Hemoglobin: 12.9 g/dL — ABNORMAL LOW (ref 13.0–18.0)
MCH: 29.1 pg (ref 26.0–34.0)
MCHC: 35.3 g/dL (ref 32.0–36.0)
MCV: 82.5 fL (ref 80.0–100.0)
PLATELETS: 219 10*3/uL (ref 150–440)
RBC: 4.43 MIL/uL (ref 4.40–5.90)
RDW: 12.8 % (ref 11.5–14.5)
WBC: 5.9 10*3/uL (ref 3.8–10.6)

## 2016-04-03 LAB — BASIC METABOLIC PANEL
ANION GAP: 3 — AB (ref 5–15)
BUN: 11 mg/dL (ref 6–20)
CALCIUM: 8.6 mg/dL — AB (ref 8.9–10.3)
CO2: 31 mmol/L (ref 22–32)
Chloride: 106 mmol/L (ref 101–111)
Creatinine, Ser: 0.92 mg/dL (ref 0.61–1.24)
GLUCOSE: 185 mg/dL — AB (ref 65–99)
POTASSIUM: 4.1 mmol/L (ref 3.5–5.1)
SODIUM: 140 mmol/L (ref 135–145)

## 2016-04-03 NOTE — Progress Notes (Signed)
Patient ID: Keith [name], male   DOB: 04/01/1971, 45 y.o.   MRN: [id number]  [employer] PROGRESS NOTE  Keith [name] OZH:[id number] DOB: 04/13/1971 DOA: 03/31/2016 [facility]  HPI/Subjective: Patient feels well. Not really having pain in his foot. States he knows he has to take care of himself better.  Objective: Vitals:   04/03/16 0745 04/03/16 1106  BP: (!) 152/93 129/81  Pulse: 72 77  Resp:  18  Temp: 97.9 F (36.6 C) 98.6 F (37 C)    Filed Weights   03/31/16 1637 04/01/16 0115  Weight: 104.3 kg (230 lb) 107 kg (235 lb 12.8 oz)    ROS: Review of Systems  Constitutional: Negative for chills and fever.  Eyes: Negative for blurred vision.  Respiratory: Negative for cough and shortness of breath.   Cardiovascular: Negative for chest pain.  Gastrointestinal: Negative for abdominal pain, constipation, diarrhea, nausea and vomiting.  Genitourinary: Negative for dysuria.  Musculoskeletal: Negative for joint pain.  Neurological: Negative for dizziness and headaches.   Exam: Physical Exam  Constitutional: He is oriented to person, place, and time.  HENT:  Nose: No mucosal edema.  Mouth/Throat: No oropharyngeal exudate or posterior oropharyngeal edema.  Eyes: Conjunctivae, EOM and lids are normal. Pupils are equal, round, and reactive to light.  Neck: No JVD present. Carotid bruit is not present. No edema present. No thyroid mass and no thyromegaly present.  Cardiovascular: S1 normal and S2 normal.  Exam reveals no gallop.   No murmur heard. Pulses:      Dorsalis pedis pulses are 2+ on the right side, and 2+ on the left side.  Respiratory: No respiratory distress. He has no wheezes. He has no rhonchi. He has no rales.  GI: Soft. Bowel sounds are normal. There is no tenderness.  Musculoskeletal:       Right ankle: He exhibits no swelling.       Left ankle: He exhibits no swelling.  Lymphadenopathy:    He has no cervical adenopathy.  Neurological: He is  alert and oriented to person, place, and time. No cranial nerve deficit.  Skin: Skin is warm. Nails show no clubbing.  Large dressing over the left foot.  Psychiatric: He has a normal mood and affect.      Data Reviewed: Basic Metabolic Panel:  Recent Labs Lab 03/31/16 1640 04/01/16 0511 04/03/16 0426  NA 138 139 140  K 4.4 3.9 4.1  CL 103 109 106  CO2 26 25 31   GLUCOSE 465* 277* 185*  BUN 14 14 11   CREATININE 1.21 0.89 0.92  CALCIUM 9.4 8.3* 8.6*  MG 1.9  --   --   PHOS 3.3  --   --    CBC:  Recent Labs Lab 03/31/16 1640 04/01/16 0511 04/03/16 0426  WBC 5.7 5.0 5.9  NEUTROABS 4.3  --   --   HGB 12.9* 12.2* 12.9*  HCT 36.3* 35.2* 36.5*  MCV 83.3 82.9 82.5  PLT 214 183 219    CBG:  Recent Labs Lab 04/02/16 1710 04/02/16 2124 04/03/16 0800 04/03/16 1107 04/03/16 1220  GLUCAP 146* 228* 191* 231* 204*    Recent Results (from the past 240 hour(s))  Aerobic/Anaerobic Culture (surgical/deep wound)     Status: None (Preliminary result)   Collection Time: 03/31/16  8:55 PM  Result Value Ref Range Status   Specimen Description FOOT left  Final   Special Requests Normal  Final   Gram Stain   Final  FEW SQUAMOUS EPITHELIAL CELLS PRESENT FEW WBC PRESENT,BOTH PMN AND MONONUCLEAR FEW GRAM POSITIVE COCCI IN CHAINS IN PAIRS RARE YEAST    Culture   Final    MODERATE GROUP B STREP(S.AGALACTIAE)ISOLATED TESTING AGAINST S. AGALACTIAE NOT ROUTINELY PERFORMED DUE TO PREDICTABILITY OF AMP/PEN/VAN SUSCEPTIBILITY. CULTURE REINCUBATED FOR BETTER GROWTH Virtually 100% of S. agalactiae (Group B) strains are susceptible to Penicillin.  For Penicillin-allergic patients, Erythromycin (85-95% sensitive) and Clindamycin (80% sensitive) are drugs of choice. Contact microbiology lab to request sensitivities if  needed within 7 days. Performed at [facility]    Report Status PENDING  Incomplete  MRSA PCR Screening     Status: None   Collection Time: 04/02/16 11:00 AM   Result Value Ref Range Status   MRSA by PCR NEGATIVE NEGATIVE Final    Comment:        The GeneXpert MRSA Assay (FDA approved for NASAL specimens only), is one component of a comprehensive MRSA colonization surveillance program. It is not intended to diagnose MRSA infection nor to guide or monitor treatment for MRSA infections.   Aerobic/Anaerobic Culture (surgical/deep wound)     Status: None (Preliminary result)   Collection Time: 04/02/16  3:59 PM  Result Value Ref Range Status   Specimen Description ABSCESS LEFT FOOT  Final   Special Requests PATIENT ON FOLLOWING ANCEF  Final   Gram Stain   Final    ABUNDANT WBC PRESENT, PREDOMINANTLY PMN MODERATE GRAM POSITIVE COCCI IN PAIRS    Culture   Final    CULTURE REINCUBATED FOR BETTER GROWTH Performed at [facility]    Report Status PENDING  Incomplete     Scheduled Meds: . ampicillin-sulbactam (UNASYN) IV  3 g Intravenous Q6H  . atorvastatin  40 mg Oral Daily  . enoxaparin (LOVENOX) injection  40 mg Subcutaneous Q24H  . gabapentin  1,200 mg Oral TID  . insulin aspart  0-15 Units Subcutaneous TID WC  . insulin aspart  0-5 Units Subcutaneous QHS  . insulin glargine  20 Units Subcutaneous Daily  . insulin glargine  80 Units Subcutaneous QHS  . losartan  50 mg Oral Daily    Assessment/Plan:  1. Diabetic foot ulcer and cellulitis. MRI showing small abscess versus hematoma and no signs of osteomyelitis. Continue IV Unasyn while here. Previous culture on 03/24/2016 growing staph aureus. So far first culture growing group B strep. Dr. Rondel Baton to the operating room yesterday afternoon. Dr. Ether Griffins recommended IV antibiotics today and potential discharge home tomorrow on oral antibiotics. 2. Uncontrolled type I.5 diabetes mellitus. Continue Lantus insulin and sliding scale. Hemoglobin A1c elevated at 13.9. Consideration of insulin pump as outpatient so the patient will not have to stick himself so often. 3. Hypertension  on Cozaar 4. Diabetic neuropathy on Neurontin 5. Hyperlipidemia unspecified on Lipitor  Code Status:     Code Status Orders        Start     Ordered   04/01/16 0049  Full code  Continuous     04/01/16 0049    Code Status History    Date Active Date Inactive Code Status Order ID Comments User Context   This patient has a current code status but no historical code status.     Disposition Plan: Likely home tomorrow after podiatry reevaluation  Consultants:  Podiatry  Antibiotics:  Unasyn  Time spent: 22 minutes  Alford Highland  [employer]

## 2016-04-03 NOTE — Care Management (Signed)
Met with patient and Dr. Vickki Muff at bedside. Patient states his wife is a Marine scientist and can complete his dressing changes. MD agrees.Patietn ahs a cane so will not need any DME or home health per MD. NO needs identified, Case Closed.

## 2016-04-03 NOTE — Progress Notes (Signed)
Daily Progress Note   Subjective  - 1 Day Post-Op  F/u left foot I & D.  Doing well without pain.  Objective Vitals:   04/03/16 0025 04/03/16 0507 04/03/16 0745 04/03/16 1106  BP: 135/78 (!) 141/79 (!) 152/93 129/81  Pulse: 80 73 72 77  Resp: 16 18  18   Temp: 98.1 F (36.7 C) 97.7 F (36.5 C) 97.9 F (36.6 C) 98.6 F (37 C)  TempSrc: Oral Oral Oral Oral  SpO2: 97% 96% 98% 98%  Weight:      Height:        Physical Exam: Dressing changed.  Scant purulence on packing. Erythema markedly improved.  Good CFT to toes.  Laboratory CBC    Component Value Date/Time   WBC 5.9 04/03/2016 0426   HGB 12.9 (L) 04/03/2016 0426   HCT 36.5 (L) 04/03/2016 0426   PLT 219 04/03/2016 0426    BMET    Component Value Date/Time   NA 140 04/03/2016 0426   K 4.1 04/03/2016 0426   CL 106 04/03/2016 0426   CO2 31 04/03/2016 0426   GLUCOSE 185 (H) 04/03/2016 0426   BUN 11 04/03/2016 0426   CREATININE 0.92 04/03/2016 0426   CALCIUM 8.6 (L) 04/03/2016 0426   GFRNONAA >60 04/03/2016 0426   GFRAA >60 04/03/2016 0426   Results for orders placed or performed during the hospital encounter of 03/31/16  Aerobic/Anaerobic Culture (surgical/deep wound)     Status: None (Preliminary result)   Collection Time: 03/31/16  8:55 PM  Result Value Ref Range Status   Specimen Description FOOT left  Final   Special Requests Normal  Final   Gram Stain   Final    FEW SQUAMOUS EPITHELIAL CELLS PRESENT FEW WBC PRESENT,BOTH PMN AND MONONUCLEAR FEW GRAM POSITIVE COCCI IN CHAINS IN PAIRS RARE YEAST    Culture   Final    MODERATE GROUP B STREP(S.AGALACTIAE)ISOLATED TESTING AGAINST S. AGALACTIAE NOT ROUTINELY PERFORMED DUE TO PREDICTABILITY OF AMP/PEN/VAN SUSCEPTIBILITY. CULTURE REINCUBATED FOR BETTER GROWTH Virtually 100% of S. agalactiae (Group B) strains are susceptible to Penicillin.  For Penicillin-allergic patients, Erythromycin (85-95% sensitive) and Clindamycin (80% sensitive) are drugs of choice.  Contact microbiology lab to request sensitivities if  needed within 7 days. Performed at [facility]    Report Status PENDING  Incomplete  MRSA PCR Screening     Status: None   Collection Time: 04/02/16 11:00 AM  Result Value Ref Range Status   MRSA by PCR NEGATIVE NEGATIVE Final    Comment:        The GeneXpert MRSA Assay (FDA approved for NASAL specimens only), is one component of [name] comprehensive MRSA colonization surveillance program. It is not intended to diagnose MRSA infection nor to guide or monitor treatment for MRSA infections.   Aerobic/Anaerobic Culture (surgical/deep wound)     Status: None (Preliminary result)   Collection Time: 04/02/16  3:59 PM  Result Value Ref Range Status   Specimen Description ABSCESS LEFT FOOT  Final   Special Requests PATIENT ON FOLLOWING ANCEF  Final   Gram Stain   Final    ABUNDANT WBC PRESENT, PREDOMINANTLY PMN MODERATE GRAM POSITIVE COCCI IN PAIRS    Culture   Final    CULTURE REINCUBATED FOR BETTER GROWTH Performed at [facility]    Report Status PENDING  Incomplete     Assessment/Planning: Diabetic foot infection s/p I & D   Dressing changed and re-packed.  Suspect d/c tomorrow.  Will write dressing orders tomorrow.  Will need either packing or alginate.  Pt states wife if capable (nurse.)  No osteo and c&s consistent with group b strep in house.  Outpt culture with staph.  Likely d/c on po augmentin.  Will re-evaluate in am.  PT ordered for PWB heel only left foot.    Keith [name], Keith [name]  04/03/2016, 12:18 PM

## 2016-04-04 LAB — GLUCOSE, CAPILLARY: GLUCOSE-CAPILLARY: 252 mg/dL — AB (ref 65–99)

## 2016-04-04 MED ORDER — AMOXICILLIN-POT CLAVULANATE 875-125 MG PO TABS: 1.0000 | ORAL_TABLET | Freq: Two times a day (BID) | ORAL | 0 refills | Status: DC

## 2016-04-04 MED ORDER — HYDROCODONE-ACETAMINOPHEN 5-325 MG PO TABS: 1.0000 | ORAL_TABLET | Freq: Three times a day (TID) | ORAL | 0 refills | Status: DC | PRN

## 2016-04-04 MED ORDER — AMOXICILLIN-POT CLAVULANATE 875-125 MG PO TABS: 1.0000 | ORAL_TABLET | Freq: Two times a day (BID) | ORAL | Status: DC

## 2016-04-04 MED ORDER — INSULIN GLARGINE 100 UNIT/ML ~~LOC~~ SOLN: SUBCUTANEOUS | 0 refills | Status: DC

## 2016-04-04 MED ORDER — INSULIN ASPART 100 UNIT/ML ~~LOC~~ SOLN
10.0000 [IU] | Freq: Three times a day (TID) | SUBCUTANEOUS | 0 refills | Status: DC
Start: 2016-04-04 — End: 2016-11-28

## 2016-04-04 NOTE — Progress Notes (Signed)
Patient d/c with no issues, Rx and paperwork given to pt, went over d/c instructions. No pain noted. VS wnl. No concerns noted.

## 2016-04-04 NOTE — Evaluation (Signed)
Physical Therapy Evaluation Patient Details Name: Keith [name] MRN: [id number] DOB: 08/17/1970 Today's Date: 04/04/2016   History of Present Illness  Pt is a 45 y/o presenting with a L diabetic foot ulcer s/p irrigation and debridement on 04/02/16. DG L foot imaging 04/02/16 of calcaneal spurs and small avulsion of the dorsal navicular (age uncertain). Pt is PWB and heel WB on the L LE. PMH of DM, HTN, and peripheral neuropathy,   Clinical Impression  Pt lives in a 2 story home with his wife and children (bedroom/bathroom on 2nd level). Pt has a walk-in shower with a corner seat to use. Pt has an ortho-wedge shoe from home in session and diabetic shoes for the R LE . At baseline pt is independent with all ADL's and mobility. Pt reports having the same procedure done on the R LE and was able to use his quad-cane with the ortho-wedge shoe to ambulate with (MD consulted and agreed that pt could ambulate the same way at this time). Pt at this time able to complete bed mobility independently and transfer CGA for safety only (no LOB using quad cane). Pt able to ambulate 135' CGA for safety only using a quad cane (no LOB with corrected walking speed). Pt able to ambulate 4 steps with L railing CGA for safety only (no assistance provided). Pt does not require any further PT services at this time and is safe to return home.     Follow Up Recommendations No PT follow up; home    Equipment Recommendations  Cane (Pt owns a quad cane)    Recommendations for Other Services       Precautions / Restrictions Restrictions Weight Bearing Restrictions: Yes LLE Weight Bearing: Partial weight bearing (Heel WB; MD Ether GriffinsFowler consulted on WB status, reported that with ortho-wedge shoe on pt may ambulate with a cane)      Mobility  Bed Mobility Overal bed mobility: Independent                Transfers Overall transfer level: Modified independent Equipment used: Quad cane                 Ambulation/Gait Ambulation/Gait assistance: Min guard Ambulation Distance (Feet):  (135' (to stairs) 135' (to bed)) Assistive device: Quad cane   Gait velocity: normal   General Gait Details: L LE ER to prevent rocking forward with ortho shoe; initially required min vc's to decrease pace to improve safety; remainder of session CGA for safety only (pt able to ambulate modified independent using quad cane)  Stairs Stairs: Yes Stairs assistance: Min guard Stair Management: One rail Left;Step to pattern Number of Stairs: 4 General stair comments: Pt attempted one alternating pattern with increased difficulty and was able to correct for the remaining steps; CGA for safety only (pt able to complete modified independent with railing)  Wheelchair Mobility    Modified Rankin (Stroke Patients Only)       Balance Overall balance assessment: Needs assistance Sitting-balance support: Feet supported Sitting balance-Leahy Scale: Good Sitting balance - Comments: Pt able to lean forward and put on shoes without LOB   Standing balance support: Single extremity supported Standing balance-Leahy Scale: Fair Standing balance comment: Pt initially had minor lateral LOB with self-correction; min vc's to slow speed with increased balance and no further LOB throughout session using quad cane  Pertinent Vitals/Pain Pain Assessment: No/denies pain (O2 sats >95% throughout session; HR 75 bpm at rest; 86 bpm with activity)    Home Living Family/patient expects to be discharged to:: Private residence Living Arrangements: Spouse/significant other;Children Available Help at Discharge: Family Type of Home: House Home Access: Level entry     Home Layout: Two level Home Equipment: Medical laboratory scientific officer - quad      Prior Function Level of Independence: Independent with assistive device(s)               Hand Dominance   Dominant Hand: Left    Extremity/Trunk  Assessment   Upper Extremity Assessment: Overall WFL for tasks assessed           Lower Extremity Assessment: (Pt has equal impaired sensation on B LE's with ability to feel "pressure" on B toes; B strength at least 3/5 (pt able to transfer and ambulate without knee buckling))      Cervical / Trunk Assessment: Normal  Communication   Communication: No difficulties  Cognition Arousal/Alertness: Awake/alert Behavior During Therapy: WFL for tasks assessed/performed Overall Cognitive Status: Within Functional Limits for tasks assessed                      General Comments General comments (skin integrity, edema, etc.): Pt agreeable to PT session. HR and O2 monitored throughout session without concern.     Exercises     Assessment/Plan    PT Assessment Patent does not need any further PT services  PT Problem List            PT Treatment Interventions      PT Goals (Current goals can be found in the Care Plan section)  Acute Rehab PT Goals Patient Stated Goal: To go home. PT Goal Formulation: With patient Time For Goal Achievement: 04/18/16 Potential to Achieve Goals: Good    Frequency     Barriers to discharge        Co-evaluation               End of Session Equipment Utilized During Treatment: Gait belt Activity Tolerance: Patient tolerated treatment well Patient left: in bed;with call bell/phone within reach (Bed alarm not set upon PT arrival (fall risk score of 8)) Nurse Communication: Mobility status;Precautions;Weight bearing status         Time: 2130-8657 PT Time Calculation (min) (ACUTE ONLY): 17 min   Charges:         PT G Codes:        Keith [name], SPT 04/04/2016, 1:32 PM

## 2016-04-04 NOTE — Discharge Instructions (Signed)
Dressing changes. Cleanse surrounding wound bed with wound cleanser or betadine.  Flush wound with saline.  Begin gentle superficial calcium alginate dressing to wound and cover with gauze dressing.  Change daily.  Keep dressing clean and dry.  Weight bear to heel only with ortho-wedge shoe.

## 2016-04-04 NOTE — Progress Notes (Signed)
Daily Progress Note   Subjective  - 2 Days Post-Op  F/u I & D.  Doing well.  Objective Vitals:   04/03/16 1743 04/03/16 1949 04/04/16 0433 04/04/16 0745  BP: 123/74 140/82 (!) 143/83 (!) 175/87  Pulse: 77 76 70 75  Resp: 20 19 19 17   Temp: 98.4 F (36.9 C) 98.1 F (36.7 C) 98 F (36.7 C) 99 F (37.2 C)  TempSrc: Oral Oral Oral Oral  SpO2: 96% 96% 98% 98%  Weight:      Height:        Physical Exam:  Wound looks good.  Minimal drainage today.  No purulence.  Erythema mostly resolved.  Granulation forming in wound.  Laboratory CBC    Component Value Date/Time   WBC 5.9 04/03/2016 0426   HGB 12.9 (L) 04/03/2016 0426   HCT 36.5 (L) 04/03/2016 0426   PLT 219 04/03/2016 0426    BMET    Component Value Date/Time   NA 140 04/03/2016 0426   K 4.1 04/03/2016 0426   CL 106 04/03/2016 0426   CO2 31 04/03/2016 0426   GLUCOSE 185 (H) 04/03/2016 0426   BUN 11 04/03/2016 0426   CREATININE 0.92 04/03/2016 0426   CALCIUM 8.6 (L) 04/03/2016 0426   GFRNONAA >60 04/03/2016 0426   GFRAA >60 04/03/2016 0426   Results for orders placed or performed during the hospital encounter of 03/31/16  Aerobic/Anaerobic Culture (surgical/deep wound)     Status: None (Preliminary result)   Collection Time: 03/31/16  8:55 PM  Result Value Ref Range Status   Specimen Description FOOT left  Final   Special Requests Normal  Final   Gram Stain   Final    FEW SQUAMOUS EPITHELIAL CELLS PRESENT FEW WBC PRESENT,BOTH PMN AND MONONUCLEAR FEW GRAM POSITIVE COCCI IN CHAINS IN PAIRS RARE YEAST [facility]    Culture   Final    MODERATE GROUP B STREP(S.AGALACTIAE)ISOLATED RARE CANDIDA TROPICALIS TESTING AGAINST S. AGALACTIAE NOT ROUTINELY PERFORMED DUE TO PREDICTABILITY OF AMP/PEN/VAN SUSCEPTIBILITY. NO ANAEROBES ISOLATED; CULTURE IN PROGRESS FOR 5 DAYS    Report Status PENDING  Incomplete  MRSA PCR Screening     Status: None   Collection Time: 04/02/16 11:00 AM  Result  Value Ref Range Status   MRSA by PCR NEGATIVE NEGATIVE Final    Comment:        The GeneXpert MRSA Assay (FDA approved for NASAL specimens only), is one component of a comprehensive MRSA colonization surveillance program. It is not intended to diagnose MRSA infection nor to guide or monitor treatment for MRSA infections.   Aerobic/Anaerobic Culture (surgical/deep wound)     Status: None (Preliminary result)   Collection Time: 04/02/16  3:59 PM  Result Value Ref Range Status   Specimen Description ABSCESS LEFT FOOT  Final   Special Requests PATIENT ON FOLLOWING ANCEF  Final   Gram Stain   Final    ABUNDANT WBC PRESENT, PREDOMINANTLY PMN MODERATE GRAM POSITIVE COCCI IN PAIRS    Culture   Final    FEW STREPTOCOCCUS AGALACTIAE TESTING AGAINST S. AGALACTIAE NOT ROUTINELY PERFORMED DUE TO PREDICTABILITY OF AMP/PEN/VAN SUSCEPTIBILITY. Performed at [facility]    Report Status PENDING  Incomplete    Assessment/Planning: Pt stable from podiatry standpoint for d/c   Spoke to wife who is a Engineer, civil (consulting)nurse and is willing to perform dressings.  Can begin flushing with saline and gently covering with calcium alginate.  Change daily.   Can d/c on po antibiotics.  Recommend Augmentin x 2 weeks.  Pt to f/u with me in 2 weeks in outpt clinic.  Partial WB to heel in wedge shoe.  Pt has at home.    Gwyneth Revels A  04/04/2016, 8:45 AM

## 2016-04-04 NOTE — Discharge Summary (Signed)
[facility] - [place] at [facility]   PATIENT NAME: Keith [name]    MR#:  [id number]  DATE OF BIRTH:  1971-05-19  DATE OF ADMISSION:  03/31/2016 ADMITTING PHYSICIAN: Tonye Royalty, DO  DATE OF DISCHARGE: 04/04/2016 11:35 AM  PRIMARY CARE PHYSICIAN: [facility]    ADMISSION DIAGNOSIS:  Hyperglycemia [R73.9] Wound infection (HCC) [T14.8, L08.9]  DISCHARGE DIAGNOSIS:  Active Problems:   Diabetic foot ulcer (HCC)   SECONDARY DIAGNOSIS:   Past Medical History:  Diagnosis Date  . Diabetes mellitus without complication (HCC)   . Hypertension   . Neuropathy (HCC)     HOSPITAL COURSE:   1. Diabetic foot ulcer and cellulitis. MRI showing small abscess versus hematoma and no signs of osteomyelitis. Patient was on IV Unasyn while here with the previous cultures growing staph aureus. Dr. Ether Griffins podiatry took the the operating room on 04/02/2016. He recommended Augmentin for 2 more weeks and follow-up with him as outpatient in 2 weeks. 2. Uncontrolled diabetes mellitus. Continue Lantus and NovoLog insulin. Patient interested in injecting insulin rather than the pens. Prescription written for these. Hemoglobin A1c elevated at 13.9. Patient will speak with his endocrinologist about an insulin pump in the future. 3. Hypertension on Cozaar. 4. Diabetic neuropathy on Neurontin 5. Hyperlipidemia unspecified on Lipitor  DISCHARGE CONDITIONS:   Satisfactory  CONSULTS OBTAINED:  Treatment Team:  Gwyneth Revels, DPM  DRUG ALLERGIES:   Allergies  Allergen Reactions  . Daptomycin Shortness Of Breath    Breathing issues  . Lisinopril     Cough    DISCHARGE MEDICATIONS:   Discharge Medication List as of 04/04/2016 11:14 AM    START taking these medications   Details  amoxicillin-clavulanate (AUGMENTIN) 875-125 MG tablet Take 1 tablet by mouth every 12 (twelve) hours., Starting Fri 04/04/2016, Print    HYDROcodone-acetaminophen (NORCO/VICODIN) 5-325 MG tablet  Take 1 tablet by mouth every 8 (eight) hours as needed for severe pain., Starting Fri 04/04/2016, Print      CONTINUE these medications which have CHANGED   Details  insulin aspart (NOVOLOG) 100 UNIT/ML injection Inject 10 Units into the skin 3 (three) times daily before meals. Per sliding scale, Starting Fri 04/04/2016, Print    insulin glargine (LANTUS) 100 UNIT/ML injection 20 units subcutaneous injection in am 80 units subcutaneous injection at bedtime, Print      CONTINUE these medications which have NOT CHANGED   Details  aspirin 81 MG tablet Take 81 mg by mouth daily., Historical Med    atorvastatin (LIPITOR) 40 MG tablet Take 40 mg by mouth daily., Historical Med    gabapentin (NEURONTIN) 800 MG tablet Take 1,200 mg by mouth 3 (three) times daily., Historical Med    losartan (COZAAR) 50 MG tablet Take 50 mg by mouth daily., Historical Med      STOP taking these medications     ciprofloxacin (CIPRO) 500 MG tablet      ibuprofen (ADVIL,MOTRIN) 200 MG tablet      silver sulfADIAZINE (SILVADENE) 1 % cream      vancomycin (VANCOCIN) 250 MG capsule          DISCHARGE INSTRUCTIONS:   Follow-up with Dr. Ether Griffins 2 weeks Follow-up with your medical doctor one week Follow up with your endocrinologist  If you experience worsening of your admission symptoms, develop shortness of breath, life threatening emergency, suicidal or homicidal thoughts you must seek medical attention immediately by calling 911 or calling your MD immediately  if symptoms less severe.  You  Must read complete instructions/literature along with all the possible adverse reactions/side effects for all the Medicines you take and that have been prescribed to you. Take any new Medicines after you have completely understood and accept all the possible adverse reactions/side effects.   Please note  You were cared for by a hospitalist during your hospital stay. If you have any questions about your discharge  medications or the care you received while you were in the hospital after you are discharged, you can call the unit and asked to speak with the hospitalist on call if the hospitalist that took care of you is not available. Once you are discharged, your primary care physician will handle any further medical issues. Please note that NO REFILLS for any discharge medications will be authorized once you are discharged, as it is imperative that you return to your primary care physician (or establish a relationship with a primary care physician if you do not have one) for your aftercare needs so that they can reassess your need for medications and monitor your lab values.    Today   CHIEF COMPLAINT:   Chief Complaint  Patient presents with  . Wound Infection    HISTORY OF PRESENT ILLNESS:  Keith [name]  is a 45 y.o. male brought in with a wound infection   VITAL SIGNS:  Blood pressure (!) 175/87, pulse 75, temperature 99 F (37.2 C), temperature source Oral, resp. rate 17, height 6' (1.829 m), weight 107 kg (235 lb 12.8 oz), SpO2 98 %.  Blood pressure when I saw him was 143/83  PHYSICAL EXAMINATION:  GENERAL:  45 y.o.-year-old patient lying in the bed with no acute distress.  EYES: Pupils equal, round, reactive to light and accommodation. No scleral icterus. Extraocular muscles intact.  HEENT: Head atraumatic, normocephalic. Oropharynx and nasopharynx clear.  NECK:  Supple, no jugular venous distention. No thyroid enlargement, no tenderness.  LUNGS: Normal breath sounds bilaterally, no wheezing, rales,rhonchi or crepitation. No use of accessory muscles of respiration.  CARDIOVASCULAR: S1, S2 normal. No murmurs, rubs, or gallops.  ABDOMEN: Soft, non-tender, non-distended. Bowel sounds present. No organomegaly or mass.  EXTREMITIES: No pedal edema, cyanosis, or clubbing.  NEUROLOGIC: Cranial nerves II through XII are intact. Muscle strength 5/5 in all extremities. Sensation intact. Gait not  checked.  PSYCHIATRIC: The patient is alert and oriented x 3.  SKIN: Left foot covered with pressure dressing  DATA REVIEW:   CBC  Recent Labs Lab 04/03/16 0426  WBC 5.9  HGB 12.9*  HCT 36.5*  PLT 219    Chemistries   Recent Labs Lab 03/31/16 1640  04/03/16 0426  NA 138  < > 140  K 4.4  < > 4.1  CL 103  < > 106  CO2 26  < > 31  GLUCOSE 465*  < > 185*  BUN 14  < > 11  CREATININE 1.21  < > 0.92  CALCIUM 9.4  < > 8.6*  MG 1.9  --   --   < > = values in this interval not displayed.   Microbiology Results  Results for orders placed or performed during the hospital encounter of 03/31/16  Aerobic/Anaerobic Culture (surgical/deep wound)     Status: None (Preliminary result)   Collection Time: 03/31/16  8:55 PM  Result Value Ref Range Status   Specimen Description FOOT left  Final   Special Requests Normal  Final   Gram Stain   Final    FEW SQUAMOUS EPITHELIAL CELLS PRESENT FEW  WBC PRESENT,BOTH PMN AND MONONUCLEAR FEW GRAM POSITIVE COCCI IN CHAINS IN PAIRS RARE YEAST Performed at [facility]    Culture   Final    MODERATE GROUP B STREP(S.AGALACTIAE)ISOLATED RARE CANDIDA TROPICALIS TESTING AGAINST S. AGALACTIAE NOT ROUTINELY PERFORMED DUE TO PREDICTABILITY OF AMP/PEN/VAN SUSCEPTIBILITY. NO ANAEROBES ISOLATED; CULTURE IN PROGRESS FOR 5 DAYS    Report Status PENDING  Incomplete  MRSA PCR Screening     Status: None   Collection Time: 04/02/16 11:00 AM  Result Value Ref Range Status   MRSA by PCR NEGATIVE NEGATIVE Final    Comment:        The GeneXpert MRSA Assay (FDA approved for NASAL specimens only), is one component of a comprehensive MRSA colonization surveillance program. It is not intended to diagnose MRSA infection nor to guide or monitor treatment for MRSA infections.   Aerobic/Anaerobic Culture (surgical/deep wound)     Status: None (Preliminary result)   Collection Time: 04/02/16  3:59 PM  Result Value Ref Range Status   Specimen  Description ABSCESS LEFT FOOT  Final   Special Requests PATIENT ON FOLLOWING ANCEF  Final   Gram Stain   Final    ABUNDANT WBC PRESENT, PREDOMINANTLY PMN MODERATE GRAM POSITIVE COCCI IN PAIRS Performed at [facility]    Culture   Final    FEW STREPTOCOCCUS AGALACTIAE TESTING AGAINST S. AGALACTIAE NOT ROUTINELY PERFORMED DUE TO PREDICTABILITY OF AMP/PEN/VAN SUSCEPTIBILITY. NO ANAEROBES ISOLATED; CULTURE IN PROGRESS FOR 5 DAYS    Report Status PENDING  Incomplete     Management plans discussed with the patient, family and they are in agreement.  CODE STATUS:  Code Status History    Date Active Date Inactive Code Status Order ID Comments User Context   04/01/2016 12:49 AM 04/04/2016  2:41 PM Full Code [id number]  Tonye Royalty, DO ED      TOTAL TIME TAKING CARE OF THIS PATIENT: 35 minutes.    Alford Highland M.D on 04/04/2016 at 3:57 PM  Between 7am to 6pm - Pager - [phone]  After 6pm go to [email or web address] - Social research officer, government  [employer] [facility] Office  [phone]  CC: Primary care physician; [facility]

## 2016-04-05 LAB — AEROBIC/ANAEROBIC CULTURE (SURGICAL/DEEP WOUND): SPECIAL REQUESTS: NORMAL

## 2016-04-05 LAB — AEROBIC/ANAEROBIC CULTURE W GRAM STAIN (SURGICAL/DEEP WOUND)

## 2016-04-07 LAB — AEROBIC/ANAEROBIC CULTURE W GRAM STAIN (SURGICAL/DEEP WOUND)

## 2016-04-25 ENCOUNTER — Ambulatory Visit: Payer: BC Managed Care – PPO | Admitting: Podiatry

## 2016-10-15 ENCOUNTER — Encounter: Payer: Self-pay | Admitting: Emergency Medicine

## 2016-10-15 ENCOUNTER — Emergency Department
Admission: EM | Admit: 2016-10-15 | Discharge: 2016-10-15 | Disposition: A | Discharge status: Home / Self Care | Payer: BC Managed Care – PPO | Attending: Student in an Organized Health Care Education/Training Program | Admitting: Student in an Organized Health Care Education/Training Program

## 2016-10-15 ENCOUNTER — Ambulatory Visit
Admission: EM | Admit: 2016-10-15 | Discharge: 2016-10-15 | Disposition: A | Discharge status: Hospice | Payer: BC Managed Care – PPO | Attending: Family Medicine | Admitting: Family Medicine

## 2016-10-15 DIAGNOSIS — R338 Other retention of urine: Secondary | ICD-10-CM

## 2016-10-15 DIAGNOSIS — N3001 Acute cystitis with hematuria: Secondary | ICD-10-CM | POA: Diagnosis not present

## 2016-10-15 DIAGNOSIS — Z7982 Long term (current) use of aspirin: Secondary | ICD-10-CM | POA: Insufficient documentation

## 2016-10-15 DIAGNOSIS — I10 Essential (primary) hypertension: Secondary | ICD-10-CM | POA: Diagnosis not present

## 2016-10-15 DIAGNOSIS — N390 Urinary tract infection, site not specified: Secondary | ICD-10-CM

## 2016-10-15 DIAGNOSIS — R300 Dysuria: Secondary | ICD-10-CM | POA: Diagnosis present

## 2016-10-15 DIAGNOSIS — Z794 Long term (current) use of insulin: Secondary | ICD-10-CM | POA: Insufficient documentation

## 2016-10-15 DIAGNOSIS — E1010 Type 1 diabetes mellitus with ketoacidosis without coma: Secondary | ICD-10-CM | POA: Diagnosis not present

## 2016-10-15 DIAGNOSIS — R739 Hyperglycemia, unspecified: Secondary | ICD-10-CM

## 2016-10-15 DIAGNOSIS — Z79899 Other long term (current) drug therapy: Secondary | ICD-10-CM | POA: Diagnosis not present

## 2016-10-15 DIAGNOSIS — E1165 Type 2 diabetes mellitus with hyperglycemia: Secondary | ICD-10-CM | POA: Diagnosis not present

## 2016-10-15 LAB — URINALYSIS, COMPLETE (UACMP) WITH MICROSCOPIC
BACTERIA UA: NONE SEEN
BILIRUBIN URINE: NEGATIVE
Bilirubin Urine: NEGATIVE
Glucose, UA: >1000 mg/dL — AB
Glucose, UA: >=500 mg/dL — AB
Ketones, ur: 15 mg/dL — AB
Ketones, ur: 20 mg/dL — AB
NITRITE: POSITIVE — AB
Nitrite: NEGATIVE
Protein, ur: 100 mg/dL — AB
Protein, ur: 100 mg/dL — AB
SPECIFIC GRAVITY, URINE: 1.015 (ref 1.005–1.030)
SQUAMOUS EPITHELIAL / LPF: NONE SEEN
Specific Gravity, Urine: 1.025 (ref 1.005–1.030)
Squamous Epithelial / LPF: NONE SEEN
pH: 6.5 (ref 5.0–8.0)
pH: 6 (ref 5.0–8.0)

## 2016-10-15 LAB — CBC WITH DIFFERENTIAL/PLATELET
BASOS ABS: 0.1 10*3/uL (ref 0–0.1)
Basophils Relative: 1 %
EOS ABS: 0.1 10*3/uL (ref 0–0.7)
Eosinophils Relative: 1 %
HCT: 39.4 % — ABNORMAL LOW (ref 40.0–52.0)
HEMOGLOBIN: 13.4 g/dL (ref 13.0–18.0)
LYMPHS ABS: 0.7 10*3/uL — AB (ref 1.0–3.6)
LYMPHS PCT: 6 %
MCH: 28.6 pg (ref 26.0–34.0)
MCHC: 34.1 g/dL (ref 32.0–36.0)
MCV: 83.9 fL (ref 80.0–100.0)
Monocytes Absolute: 0.6 10*3/uL (ref 0.2–1.0)
Monocytes Relative: 6 %
NEUTROS PCT: 86 %
Neutro Abs: 9.2 10*3/uL — ABNORMAL HIGH (ref 1.4–6.5)
Platelets: 251 10*3/uL (ref 150–440)
RBC: 4.7 MIL/uL (ref 4.40–5.90)
RDW: 12.8 % (ref 11.5–14.5)
WBC: 10.7 10*3/uL — AB (ref 3.8–10.6)

## 2016-10-15 LAB — BASIC METABOLIC PANEL
Anion gap: 11 (ref 5–15)
Anion gap: 7 (ref 5–15)
BUN: 13 mg/dL (ref 6–20)
BUN: 11 mg/dL (ref 6–20)
CALCIUM: 9.4 mg/dL (ref 8.9–10.3)
CHLORIDE: 100 mmol/L — AB (ref 101–111)
CO2: 26 mmol/L (ref 22–32)
CO2: 27 mmol/L (ref 22–32)
CREATININE: 1.04 mg/dL (ref 0.61–1.24)
Calcium: 8.8 mg/dL — ABNORMAL LOW (ref 8.9–10.3)
Chloride: 91 mmol/L — ABNORMAL LOW (ref 101–111)
Creatinine, Ser: 0.96 mg/dL (ref 0.61–1.24)
GFR calc Af Amer: >60 mL/min (ref >60)
GFR calc Af Amer: >60 mL/min (ref >60)
GFR calc non Af Amer: >60 mL/min (ref >60)
GFR calc non Af Amer: >60 mL/min (ref >60)
GLUCOSE: 643 mg/dL — AB (ref 65–99)
GLUCOSE: 345 mg/dL — AB (ref 65–99)
POTASSIUM: 4.4 mmol/L (ref 3.5–5.1)
Potassium: 5.2 mmol/L — ABNORMAL HIGH (ref 3.5–5.1)
Sodium: 128 mmol/L — ABNORMAL LOW (ref 135–145)
Sodium: 134 mmol/L — ABNORMAL LOW (ref 135–145)

## 2016-10-15 LAB — GLUCOSE, CAPILLARY
GLUCOSE-CAPILLARY: 505 mg/dL — AB (ref 65–99)
Glucose-Capillary: >600 mg/dL (ref 65–99)

## 2016-10-15 MED ORDER — SODIUM CHLORIDE 0.9 % IV BOLUS (SEPSIS)
1000.0000 mL | Freq: Once | INTRAVENOUS | Status: AC
  Administered 2016-10-15: 1000 mL via INTRAVENOUS

## 2016-10-15 MED ORDER — DEXTROSE 5 % IV SOLN: 1.0000 g | Freq: Once | INTRAVENOUS | Status: DC

## 2016-10-15 MED ORDER — METFORMIN HCL 500 MG PO TABS: 500.0000 mg | ORAL_TABLET | Freq: Two times a day (BID) | ORAL | 0 refills | Status: DC

## 2016-10-15 MED ORDER — CEFTRIAXONE SODIUM-DEXTROSE 1-3.74 GM-% IV SOLR
1.0000 g | Freq: Once | INTRAVENOUS | Status: AC
  Administered 2016-10-15: 1 g via INTRAVENOUS
  Filled 2016-10-15: qty 50

## 2016-10-15 MED ORDER — METFORMIN HCL 500 MG PO TABS: 500.0000 mg | ORAL_TABLET | Freq: Two times a day (BID) | ORAL | 11 refills | Status: DC

## 2016-10-15 MED ORDER — CEPHALEXIN 500 MG PO CAPS: 500.0000 mg | ORAL_CAPSULE | Freq: Four times a day (QID) | ORAL | 0 refills | Status: AC

## 2016-10-15 MED ORDER — INSULIN ASPART 100 UNIT/ML ~~LOC~~ SOLN
10.0000 [IU] | Freq: Once | SUBCUTANEOUS | Status: AC
  Administered 2016-10-15: 10 [IU] via INTRAVENOUS
  Filled 2016-10-15: qty 10

## 2016-10-15 NOTE — Discharge Instructions (Signed)
Patient to go to ER at [facility]

## 2016-10-15 NOTE — ED Notes (Signed)
Pt reports dysuria, high blood sugar and pain   Pt states no insulin or meds for diabetes for past 6months.  Pt reports painful urination and pressure when voiding   No fever.  No n/v/d. Pt alert   Speech clear.  md at bedside.  Iv started and labs sent.

## 2016-10-15 NOTE — ED Triage Notes (Signed)
Patient presents to ED via POV with multiple complaints. CBG reading HIGH at this time. Patient was seen at [facility] today. Told he had a uti. Patient taken back to room 4 at this time.

## 2016-10-15 NOTE — ED Provider Notes (Signed)
[facility]    CSN: [id number] Arrival date & time: 10/15/16  1646     History   Chief Complaint Chief Complaint  Patient presents with  . Urinary Retention    HPI Keith [name] is a 46 y.o. male.   Patient is here because of couple days history of urinary retention. He states he can't PE. Interesting he also is a diabetic he is a very brittle diabetic who has not been on insulin for at least 3 or 4 months. He states he was tied of injecting himself about 6 times a day to decide if this is. His wife is a Engineer, civil (consulting) at [employer] and has been adamant about trying to get him to go back on his insulin which she is refusing. He started having burning with urination incomplete emptying and he states this feels as a hot poker up his urethra every time he tries go to the bathroom. He has to push on his bladder to empty his bladder completely. He's not been checking his blood sugars at home either. He's had hospitalizations for neuropathy and diabetic ulcers never smoked he is allergic to lisinopril and that daptomycin. Family history diabetes but nothing else pertinent to today's visit.   The history is provided by the patient and the spouse.  Dysuria  This is a new problem. The current episode started more than 2 days ago. The problem occurs constantly. The problem has not changed since onset.Pertinent negatives include no chest pain, no abdominal pain, no headaches and no shortness of breath. Nothing aggravates the symptoms. Nothing relieves the symptoms. He has tried nothing for the symptoms. The treatment provided no relief.    Past Medical History:  Diagnosis Date  . Diabetes mellitus without complication (HCC)   . Hypertension   . Neuropathy [facility])     Patient Active Problem List   Diagnosis Date Noted  . Diabetic foot ulcer (HCC) 03/31/2016    Past Surgical History:  Procedure Laterality Date  . IRRIGATION AND DEBRIDEMENT FOOT Left 04/02/2016   Procedure: IRRIGATION  AND DEBRIDEMENT FOOT;  Surgeon: Gwyneth Revels, DPM;  Location: [facility] ORS;  Service: Podiatry;  Laterality: Left;       Home Medications    Prior to Admission medications   Medication Sig Start Date End Date Taking? Authorizing Provider  amoxicillin-clavulanate (AUGMENTIN) 875-125 MG tablet Take 1 tablet by mouth every 12 (twelve) hours. 04/04/16   Alford Highland, MD  aspirin 81 MG tablet Take 81 mg by mouth daily.    Historical Provider, MD  atorvastatin (LIPITOR) 40 MG tablet Take 40 mg by mouth daily.    Historical Provider, MD  gabapentin (NEURONTIN) 800 MG tablet Take 1,200 mg by mouth 3 (three) times daily.    Historical Provider, MD  HYDROcodone-acetaminophen (NORCO/VICODIN) 5-325 MG tablet Take 1 tablet by mouth every 8 (eight) hours as needed for severe pain. 04/04/16   Alford Highland, MD  insulin aspart (NOVOLOG) 100 UNIT/ML injection Inject 10 Units into the skin 3 (three) times daily before meals. Per sliding scale 04/04/16   Alford Highland, MD  insulin glargine (LANTUS) 100 UNIT/ML injection 20 units subcutaneous injection in am 80 units subcutaneous injection at bedtime 04/04/16   Alford Highland, MD  losartan (COZAAR) 50 MG tablet Take 50 mg by mouth daily.    Historical Provider, MD    Family History No family history on file.  Social History Social History  Substance Use Topics  . Smoking status: Never Smoker  .  Smokeless tobacco: Never Used  . Alcohol use Not on file     Allergies   Daptomycin and Lisinopril   Review of Systems Review of Systems  Respiratory: Negative for shortness of breath.   Cardiovascular: Negative for chest pain.  Gastrointestinal: Negative for abdominal pain.  Genitourinary: Positive for difficulty urinating, dysuria and hematuria.  Neurological: Negative for headaches.  All other systems reviewed and are negative.    Physical Exam Triage Vital Signs ED Triage Vitals  Enc Vitals Group     BP 10/15/16 1702 (!) 142/97     Pulse  Rate 10/15/16 1702 (!) 110     Resp 10/15/16 1702 16     Temp 10/15/16 1702 98.2 F (36.8 C)     Temp Source 10/15/16 1702 Tympanic     SpO2 10/15/16 1702 100 %     Weight 10/15/16 1703 199 lb (90.3 kg)     Height 10/15/16 1703 6' (1.829 m)     Head Circumference --      Peak Flow --      Pain Score 10/15/16 1709 9     Pain Loc --      Pain Edu? --      Excl. in GC? --    No data found.   Updated Vital Signs BP (!) 142/97 (BP Location: Left Arm)   Pulse (!) 110   Temp 98.2 F (36.8 C) (Tympanic)   Resp 16   Ht 6' (1.829 m)   Wt 199 lb (90.3 kg)   SpO2 100%   BMI 26.99 kg/m   Visual Acuity Right Eye Distance:   Left Eye Distance:   Bilateral Distance:    Right Eye Near:   Left Eye Near:    Bilateral Near:     Physical Exam  Constitutional: He is oriented to person, place, and time. He appears well-developed and well-nourished.  HENT:  Head: Normocephalic and atraumatic.  Right Ear: External ear normal.  Left Ear: External ear normal.  Mouth/Throat: Oropharynx is clear and moist.  Eyes: EOM are normal. Pupils are equal, round, and reactive to light.  Neck: Normal range of motion.  Pulmonary/Chest: Effort normal.  Musculoskeletal: Normal range of motion.  Neurological: He is alert and oriented to person, place, and time.  Skin: Skin is warm.  Psychiatric: His mood appears anxious.  Vitals reviewed.    UC Treatments / Results  Labs (all labs ordered are listed, but only abnormal results are displayed) Labs Reviewed  URINALYSIS, COMPLETE (UACMP) WITH MICROSCOPIC - Abnormal; Notable for the following:       Result Value   APPearance CLOUDY (*)    Glucose, UA >1000 (*)    Hgb urine dipstick LARGE (*)    Ketones, ur 15 (*)    Protein, ur 100 (*)    Nitrite POSITIVE (*)    Leukocytes, UA SMALL (*)    Bacteria, UA MANY (*)    All other components within normal limits  GLUCOSE, CAPILLARY - Abnormal; Notable for the following:    Glucose-Capillary 505 (*)     All other components within normal limits  URINE CULTURE  CBG MONITORING, ED    EKG  EKG Interpretation None       Radiology No results found.  Procedures Procedures (including critical care time)  Medications Ordered in UC Medications - No data to display   Initial Impression / Assessment and Plan / UC Course  I have reviewed the triage vital signs and the nursing notes.  Pertinent labs & imaging results that were available during my care of the patient were reviewed by me and considered in my medical decision making (see chart for details).   patient will be sent to [facility] ED for further evaluation. I've explained to patient his urine is grossly abnormal this could be a sign of anything from a pyelonephritis to obstructed urinary tract system to a prostatitis to ketoacidosis. His diabetes is definitely out of control. At this point time not recommending an oral antibiotic since he might need to have some type of IV antibiotics and he needs to have kidney function evaluation done as well. His wife is in complete agreement with me she's on textural home recommended to go to [facility] and [facility] there so that he will not leave or or tried to soft self historian.  Final Clinical Impressions(s) / UC Diagnoses   Final diagnoses:  Lower urinary tract infectious disease  Acute urinary retention  Uncontrolled type 1 diabetes mellitus with ketoacidosis without coma [facility])    New Prescriptions Discharge Medication List as of 10/15/2016  6:23 PM      Note: This dictation was prepared with Dragon dictation along with smaller phrase technology. Any transcriptional errors that result from this process are unintentional.   Hassan Rowan, MD 10/15/16 [phone]

## 2016-10-15 NOTE — ED Notes (Signed)
Pt drinking water. Family at bedside.

## 2016-10-15 NOTE — ED Triage Notes (Addendum)
Feels like he need to urinate and only a little will come. Very painful, burning. Patient stated he was on insulin and BP med but stopped 6 months ago.

## 2016-10-15 NOTE — ED Provider Notes (Signed)
[facility] Emergency Department Provider Note    First MD Initiated Contact with Patient 10/15/16 1856     (approximate)  I have reviewed the triage vital signs and the nursing notes.   HISTORY  Chief Complaint Hyperglycemia    HPI [place] E Cervone is a 46 y.o. male is noncompliant with a history of insulin-dependent diabetes presents with 1 week of worsening dysuria and difficulty voiding. No measured fevers. States he has not taken his insulin in over 6 months. This he does not take it because he does not like giving himself shots. Patient denies any nausea or vomiting. Went to urgent care today because the burning sensation when he was urinating was not improving with over-the-counter medications. Denies any flank pain. No chills.  No abdominal pain.   Past Medical History:  Diagnosis Date  . Diabetes mellitus without complication (HCC)   . Hypertension   . Neuropathy (HCC)    No family history on file. Past Surgical History:  Procedure Laterality Date  . IRRIGATION AND DEBRIDEMENT FOOT Left 04/02/2016   Procedure: IRRIGATION AND DEBRIDEMENT FOOT;  Surgeon: Gwyneth Revels, DPM;  Location: [facility] ORS;  Service: Podiatry;  Laterality: Left;   Patient Active Problem List   Diagnosis Date Noted  . Diabetic foot ulcer (HCC) 03/31/2016      Prior to Admission medications   Medication Sig Start Date End Date Taking? Authorizing Provider  amoxicillin-clavulanate (AUGMENTIN) 875-125 MG tablet Take 1 tablet by mouth every 12 (twelve) hours. Patient not taking: Reported on 10/15/2016 04/04/16   Alford Highland, MD  aspirin 81 MG tablet Take 81 mg by mouth daily.    Historical Provider, MD  atorvastatin (LIPITOR) 40 MG tablet Take 40 mg by mouth daily.    Historical Provider, MD  gabapentin (NEURONTIN) 800 MG tablet Take 1,200 mg by mouth 3 (three) times daily.    Historical Provider, MD  HYDROcodone-acetaminophen (NORCO/VICODIN) 5-325 MG tablet Take 1 tablet by  mouth every 8 (eight) hours as needed for severe pain. Patient not taking: Reported on 10/15/2016 04/04/16   Alford Highland, MD  insulin aspart (NOVOLOG) 100 UNIT/ML injection Inject 10 Units into the skin 3 (three) times daily before meals. Per sliding scale 04/04/16   Alford Highland, MD  insulin glargine (LANTUS) 100 UNIT/ML injection 20 units subcutaneous injection in am 80 units subcutaneous injection at bedtime 04/04/16   Alford Highland, MD  losartan (COZAAR) 50 MG tablet Take 50 mg by mouth daily.    Historical Provider, MD    Allergies Daptomycin and Lisinopril    Social History Social History  Substance Use Topics  . Smoking status: Never Smoker  . Smokeless tobacco: Never Used  . Alcohol use Not on file    Review of Systems Patient denies headaches, rhinorrhea, blurry vision, numbness, shortness of breath, chest pain, edema, cough, abdominal pain, nausea, vomiting, diarrhea, dysuria, fevers, rashes or hallucinations unless otherwise stated above in HPI. ____________________________________________   PHYSICAL EXAM:  VITAL SIGNS: Vitals:   10/15/16 1845  BP: 140/84  Pulse: (!) 121  Resp: 16  Temp: 97.7 F (36.5 C)    Constitutional: Alert and oriented. Well appearing and in no acute distress. Eyes: Conjunctivae are normal. PERRL. EOMI. Head: Atraumatic. Nose: No congestion/rhinnorhea. Mouth/Throat: Mucous membranes are moist.  Oropharynx non-erythematous. Neck: No stridor. Painless ROM. No cervical spine tenderness to palpation Hematological/Lymphatic/Immunilogical: No cervical lymphadenopathy. Cardiovascular: Normal rate, regular rhythm. Grossly normal heart sounds.  Good peripheral circulation. Respiratory: Normal respiratory effort.  No  retractions. Lungs CTAB. Gastrointestinal: Soft and nontender. No distention. No abdominal bruits. No CVA tenderness. GU: nontender but mildly enlarged prostate Musculoskeletal: No lower extremity tenderness nor edema.  No  joint effusions. Neurologic:  Normal speech and language. No gross focal neurologic deficits are appreciated. No gait instability. Skin:  Skin is warm, dry and intact. No rash noted. Psychiatric: Mood and affect are normal. Speech and behavior are normal.  ____________________________________________   LABS (all labs ordered are listed, but only abnormal results are displayed)  Results for orders placed or performed during the hospital encounter of 10/15/16 (from the past 24 hour(s))  Glucose, capillary     Status: Abnormal   Collection Time: 10/15/16  6:48 PM  Result Value Ref Range   Glucose-Capillary >600 (HH) 65 - 99 mg/dL   ____________________________________________  EKG ____________________________________________  RADIOLOGY   ____________________________________________   PROCEDURES  Procedure(s) performed:  Procedures    Critical Care performed: no ____________________________________________   INITIAL IMPRESSION / ASSESSMENT AND PLAN / ED COURSE  Pertinent labs & imaging results that were available during my care of the patient were reviewed by me and considered in my medical decision making (see chart for details).  DDX: uti, pyelo, prostatitis, dka, dehydration  [place] E Ostrovsky is a 46 y.o. who presents to the ED with Hyperglycemia and dysuria as described above. Patient arrives to the ER in no acute distress. He is mildly tachycardic likely secondary to dehydration given his hyperglycemia. Will check labs to evaluate for any evidence of DKA though I doubt this to be the case given his duration off of insulin.  The patient will be placed on continuous pulse oximetry and telemetry for monitoring.  Laboratory evaluation will be sent to evaluate for the above complaints.     Clinical Course as of Oct 15 2136  Wed Oct 15, 2016  2024 Patient does not have any acidosis therefore does not meet criteria for DKA. Has mild ketones on his urine. I will give dose of  insulin. Would not expect him to be in DKA as I'm suspicious as to if he is truly a type I diabetic. Would not expect him to be able to survive for 6 months without insulin if your truly type I.  [PR]    Clinical Course User Index [PR] Willy Eddy, MD   ----------------------------------------- 9:39 PM on 10/15/2016 -----------------------------------------  Patient rechecked. Heart rate improved with IV fluids. He received an IV dose of Rocephin. Repeat BMP shows improvement in his potassium and glucose. This is not consistent with DKA. Do feel patient is stable for follow-up with PCP.  Have discussed with the patient and available family all diagnostics and treatments performed thus far and all questions were answered to the best of my ability. The patient demonstrates understanding and agreement with plan.    ____________________________________________   FINAL CLINICAL IMPRESSION(S) / ED DIAGNOSES  Final diagnoses:  Hyperglycemia  Acute cystitis with hematuria      NEW MEDICATIONS STARTED DURING THIS VISIT:  New Prescriptions   No medications on file     Note:  This document was prepared using Dragon voice recognition software and may include unintentional dictation errors.    Willy Eddy, MD 10/15/16 2139

## 2016-10-18 LAB — URINE CULTURE

## 2016-10-21 LAB — BLOOD GAS, VENOUS
ACID-BASE EXCESS: 3.9 mmol/L — AB (ref 0.0–2.0)
Bicarbonate: 29.2 mmol/L — ABNORMAL HIGH (ref 20.0–28.0)
PCO2 VEN: 46 mmHg (ref 44.0–60.0)
PH VEN: 7.41 (ref 7.250–7.430)
Patient temperature: 37

## 2016-10-23 ENCOUNTER — Telehealth: Payer: Self-pay

## 2016-10-23 ENCOUNTER — Emergency Department: Payer: BC Managed Care – PPO

## 2016-10-23 ENCOUNTER — Emergency Department
Admission: EM | Admit: 2016-10-23 | Discharge: 2016-10-24 | Disposition: A | Discharge status: Home / Self Care | Payer: BC Managed Care – PPO | Attending: Emergency Medicine | Admitting: Emergency Medicine

## 2016-10-23 ENCOUNTER — Encounter: Payer: Self-pay | Admitting: Emergency Medicine

## 2016-10-23 DIAGNOSIS — R300 Dysuria: Secondary | ICD-10-CM | POA: Diagnosis not present

## 2016-10-23 DIAGNOSIS — R739 Hyperglycemia, unspecified: Secondary | ICD-10-CM

## 2016-10-23 DIAGNOSIS — Z794 Long term (current) use of insulin: Secondary | ICD-10-CM | POA: Diagnosis not present

## 2016-10-23 DIAGNOSIS — E1165 Type 2 diabetes mellitus with hyperglycemia: Secondary | ICD-10-CM | POA: Diagnosis not present

## 2016-10-23 DIAGNOSIS — R339 Retention of urine, unspecified: Secondary | ICD-10-CM | POA: Insufficient documentation

## 2016-10-23 DIAGNOSIS — Z79899 Other long term (current) drug therapy: Secondary | ICD-10-CM | POA: Insufficient documentation

## 2016-10-23 DIAGNOSIS — I10 Essential (primary) hypertension: Secondary | ICD-10-CM | POA: Insufficient documentation

## 2016-10-23 DIAGNOSIS — K5900 Constipation, unspecified: Secondary | ICD-10-CM | POA: Insufficient documentation

## 2016-10-23 DIAGNOSIS — Z7982 Long term (current) use of aspirin: Secondary | ICD-10-CM | POA: Diagnosis not present

## 2016-10-23 LAB — URINALYSIS, ROUTINE W REFLEX MICROSCOPIC
BACTERIA UA: NONE SEEN
BILIRUBIN URINE: NEGATIVE
Glucose, UA: >=500 mg/dL — AB
KETONES UR: NEGATIVE mg/dL
LEUKOCYTES UA: NEGATIVE
NITRITE: NEGATIVE
PH: 7 (ref 5.0–8.0)
Protein, ur: NEGATIVE mg/dL
SPECIFIC GRAVITY, URINE: 1.022 (ref 1.005–1.030)
Squamous Epithelial / LPF: NONE SEEN

## 2016-10-23 NOTE — ED Triage Notes (Signed)
Pt ambulatory to triage with steady gait, no distress noted. Pt reports he was seen and diagnosed with UTI last Wednesday, prescribed Keflex. Pt has finished antibiotics, sts pain has decreased but is still experiencing pressure and decreased urination.

## 2016-10-23 NOTE — Telephone Encounter (Signed)
-----   Message from Domenick Gong, MD sent at 10/23/2016  8:32 AM EDT ----- Staph aureus UTI that is pansensitive. He was given Rocephin in the ER and was sent home on keflex 500 mg qid x 7 days. I cannot not find any documentation in the record that would define it is a complicated UTI such as fevers, chills, flank pain, CVA tenderness, pelvic or perineal pain. The Keflex prescription for 7 day should be sufficient. Please call patient and notify of results and see if he is getting better. Make sure he finishes all of his antibiotics and he is to follow-up with his primary care physician or here if he is not better after completing the antibiotics. He is to go to the ER for  any worsening of sx.   Thanks- AM

## 2016-10-23 NOTE — Telephone Encounter (Signed)
Courtesy call back completed today for patients recent visit at [facility]. Patient did not answer, left message on voicemail to call back with any questions or concerns.   

## 2016-10-23 NOTE — ED Provider Notes (Signed)
[facility] Emergency Department Provider Note   ____________________________________________   First MD Initiated Contact with Patient 10/23/16 2345     (approximate)  I have reviewed the triage vital signs and the nursing notes.   HISTORY  Chief Complaint Urinary Retention    HPI Keith [name] is a 46 y.o. male who presents to the ED from home with a chief complaint of urinary retention. Patient was seen at urgent care last Wednesday for retention and dysuria; referred to the ED for evaluation. He was placed on Keflex for UTI. Reports he took his last antibiotic yesterday but today has had increasing difficulty urinating. He is able to "dribble" but feels like he is not completely voiding his bladder. Denies associated fever, chills, chest pain, shortness of breath, nausea, vomiting. Took a laxative yesterday for constipation and today has had several good bowel movements. Patient reports his prostate was checked last week and told he had a normal prostate.   Past Medical History:  Diagnosis Date  . Diabetes mellitus without complication (HCC)   . Hypertension   . Neuropathy [facility])     Patient Active Problem List   Diagnosis Date Noted  . Diabetic foot ulcer (HCC) 03/31/2016    Past Surgical History:  Procedure Laterality Date  . IRRIGATION AND DEBRIDEMENT FOOT Left 04/02/2016   Procedure: IRRIGATION AND DEBRIDEMENT FOOT;  Surgeon: Gwyneth Revels, DPM;  Location: [facility] ORS;  Service: Podiatry;  Laterality: Left;    Prior to Admission medications   Medication Sig Start Date End Date Taking? Authorizing Provider  amoxicillin-clavulanate (AUGMENTIN) 875-125 MG tablet Take 1 tablet by mouth every 12 (twelve) hours. Patient not taking: Reported on 10/15/2016 04/04/16   Alford Highland, MD  aspirin 81 MG tablet Take 81 mg by mouth daily.    Historical Provider, MD  atorvastatin (LIPITOR) 40 MG tablet Take 40 mg by mouth daily.    Historical Provider, MD    gabapentin (NEURONTIN) 800 MG tablet Take 1,200 mg by mouth 3 (three) times daily.    Historical Provider, MD  HYDROcodone-acetaminophen (NORCO/VICODIN) 5-325 MG tablet Take 1 tablet by mouth every 8 (eight) hours as needed for severe pain. Patient not taking: Reported on 10/15/2016 04/04/16   Alford Highland, MD  ibuprofen (ADVIL,MOTRIN) 200 MG tablet Take 200 mg by mouth every 6 (six) hours as needed.    Historical Provider, MD  insulin aspart (NOVOLOG) 100 UNIT/ML injection Inject 10 Units into the skin 3 (three) times daily before meals. Per sliding scale Patient not taking: Reported on 10/15/2016 04/04/16   Alford Highland, MD  insulin glargine (LANTUS) 100 UNIT/ML injection 20 units subcutaneous injection in am 80 units subcutaneous injection at bedtime Patient not taking: Reported on 10/15/2016 04/04/16   Alford Highland, MD  lactulose (CHRONULAC) 10 GM/15ML solution Take 30 mLs (20 g total) by mouth daily as needed for mild constipation. 10/24/16   Irean Hong, MD  losartan (COZAAR) 50 MG tablet Take 50 mg by mouth daily.    Historical Provider, MD  metFORMIN (GLUCOPHAGE) 500 MG tablet Take 1 tablet (500 mg total) by mouth 2 (two) times daily with a meal. 10/15/16 10/15/17  Willy Eddy, MD  metFORMIN (GLUCOPHAGE) 500 MG tablet Take 1 tablet (500 mg total) by mouth 2 (two) times daily with a meal. 10/15/16 10/15/17  Willy Eddy, MD  phenazopyridine (PYRIDIUM) 95 MG tablet Take 95 mg by mouth 3 (three) times daily as needed for pain.    Historical Provider, MD  tamsulosin (  FLOMAX) 0.4 MG CAPS capsule Take 1 capsule (0.4 mg total) by mouth daily. 10/24/16   Irean Hong, MD    Allergies Daptomycin and Lisinopril  History reviewed. No pertinent family history.  Social History Social History  Substance Use Topics  . Smoking status: Never Smoker  . Smokeless tobacco: Never Used  . Alcohol use No    Review of Systems  Constitutional: No fever/chills. Eyes: No visual changes. ENT: No sore  throat. Cardiovascular: Denies chest pain. Respiratory: Denies shortness of breath. Gastrointestinal: Positive for abdominal pain.  No nausea, no vomiting.  No diarrhea.  Positive for constipation. Genitourinary: Positive for dysuria and retention. Musculoskeletal: Negative for back pain. Skin: Negative for rash. Neurological: Negative for headaches, focal weakness or numbness.  10-point ROS otherwise negative.  ____________________________________________   PHYSICAL EXAM:  VITAL SIGNS: ED Triage Vitals  Enc Vitals Group     BP 10/23/16 2323 (!) 169/100     Pulse Rate 10/23/16 2323 (!) 120     Resp 10/23/16 2323 16     Temp 10/23/16 2323 98.4 F (36.9 C)     Temp Source 10/23/16 2323 Oral     SpO2 10/23/16 2318 99 %     Weight 10/23/16 2323 199 lb (90.3 kg)     Height 10/23/16 2323 6' (1.829 m)     Head Circumference --      Peak Flow --      Pain Score --      Pain Loc --      Pain Edu? --      Excl. in GC? --    Examined after Foley placement: Constitutional: Alert and oriented. Well appearing and in no acute distress. Eyes: Conjunctivae are normal. PERRL. EOMI. Head: Atraumatic. Nose: No congestion/rhinnorhea. Mouth/Throat: Mucous membranes are moist.  Oropharynx non-erythematous. Neck: No stridor.   Cardiovascular: Normal rate, regular rhythm. Grossly normal heart sounds.  Good peripheral circulation. Respiratory: Normal respiratory effort.  No retractions. Lungs CTAB. Gastrointestinal: Soft and nontender. No distention. No abdominal bruits. No CVA tenderness. Genitourinary: Indwelling Foley in place draining clear urine. Musculoskeletal: No lower extremity tenderness nor edema.  No joint effusions. Neurologic:  Normal speech and language. No gross focal neurologic deficits are appreciated. No gait instability. Skin:  Skin is warm, dry and intact. No rash noted. Psychiatric: Mood and affect are normal. Speech and behavior are  normal.  ____________________________________________   LABS (all labs ordered are listed, but only abnormal results are displayed)  Labs Reviewed  URINALYSIS, ROUTINE W REFLEX MICROSCOPIC - Abnormal; Notable for the following:       Result Value   Color, Urine STRAW (*)    APPearance CLEAR (*)    Glucose, UA >=500 (*)    Hgb urine dipstick MODERATE (*)    All other components within normal limits  CBC WITH DIFFERENTIAL/PLATELET - Abnormal; Notable for the following:    Hemoglobin 12.1 (*)    HCT 36.0 (*)    All other components within normal limits  BASIC METABOLIC PANEL - Abnormal; Notable for the following:    Sodium 133 (*)    Chloride 100 (*)    Glucose, Bld 557 (*)    All other components within normal limits  GLUCOSE, CAPILLARY - Abnormal; Notable for the following:    Glucose-Capillary 291 (*)    All other components within normal limits  URINE CULTURE   ____________________________________________  EKG  None ____________________________________________  RADIOLOGY  CT renal stone study interpreted per Dr. Phill Myron: 1.  Bladder decompressed with a Foley catheter in place.  Circumferential bladder wall thickening with mild adjacent hazy  stranding, may be related incomplete distension, catheterization,  and/or cystitis.  2. No CT evidence for nephrolithiasis or obstructive uropathy. No  hydroureteronephrosis.  3. No other acute intra-abdominopelvic process.   ____________________________________________   PROCEDURES  Procedure(s) performed: None  Procedures  Critical Care performed: No  ____________________________________________   INITIAL IMPRESSION / ASSESSMENT AND PLAN / ED COURSE  Pertinent labs & imaging results that were available during my care of the patient were reviewed by me and considered in my medical decision making (see chart for details).  46 year old male who presents with urinary retention in the setting of recent Keflex for  UTI. Bladder scan reveals greater than 999 mL. Patient was able to void after initial bladder scan. Post void residual remains greater than 999 ML. Indwelling Foley placed without difficulty with immediate relief of patient's symptoms. Will obtain screening lab work, urinalysis and CT scan.  Clinical Course as of Oct 25 242  Fri Oct 24, 2016  0130 Update patient of laboratory and imaging results. Regular insulin given for hyperglycemia. Will hold for fluids and blood sugar recheck. Patient resting in no acute distress.  [JS]  0243 Blood sugar down to 291. Patient has insulin at home and tells me he will be compliant with his medications. We discussed whether or not to place him on empiric antibiotic given that he will be discharged with the Foley catheter. We agreed to hold empiric antibiotic for now given that patient has just finished a course of Keflex and I wish to avoid C. difficile. He will be discharged home with the Foley catheter and prescriptions for Flomax and lactulose. Patient will follow-up with urology for Foley removal in 7-10 days. Strict return precautions given. Patient verbalizes understanding and agrees with plan of care.  [JS]    Clinical Course User Index [JS] Irean Hong, MD     ____________________________________________   FINAL CLINICAL IMPRESSION(S) / ED DIAGNOSES  Final diagnoses:  Urinary retention  Hyperglycemia  Constipation, unspecified constipation type      NEW MEDICATIONS STARTED DURING THIS VISIT:  New Prescriptions   LACTULOSE (CHRONULAC) 10 GM/15ML SOLUTION    Take 30 mLs (20 g total) by mouth daily as needed for mild constipation.   TAMSULOSIN (FLOMAX) 0.4 MG CAPS CAPSULE    Take 1 capsule (0.4 mg total) by mouth daily.     Note:  This document was prepared using Dragon voice recognition software and may include unintentional dictation errors.    Irean Hong, MD 10/24/16 [phone]

## 2016-10-24 LAB — BASIC METABOLIC PANEL
ANION GAP: 10 (ref 5–15)
BUN: 16 mg/dL (ref 6–20)
CHLORIDE: 100 mmol/L — AB (ref 101–111)
CO2: 23 mmol/L (ref 22–32)
Calcium: 9.5 mg/dL (ref 8.9–10.3)
Creatinine, Ser: 1.08 mg/dL (ref 0.61–1.24)
GFR calc Af Amer: >60 mL/min (ref >60)
GFR calc non Af Amer: >60 mL/min (ref >60)
Glucose, Bld: 557 mg/dL (ref 65–99)
POTASSIUM: 4.2 mmol/L (ref 3.5–5.1)
SODIUM: 133 mmol/L — AB (ref 135–145)

## 2016-10-24 LAB — CBC WITH DIFFERENTIAL/PLATELET
Basophils Absolute: 0 10*3/uL (ref 0–0.1)
Basophils Relative: 0 %
Eosinophils Absolute: 0.1 10*3/uL (ref 0–0.7)
Eosinophils Relative: 1 %
HCT: 36 % — ABNORMAL LOW (ref 40.0–52.0)
Hemoglobin: 12.1 g/dL — ABNORMAL LOW (ref 13.0–18.0)
LYMPHS ABS: 1 10*3/uL (ref 1.0–3.6)
Lymphocytes Relative: 14 %
MCH: 27.3 pg (ref 26.0–34.0)
MCHC: 33.6 g/dL (ref 32.0–36.0)
MCV: 81.4 fL (ref 80.0–100.0)
MONO ABS: 0.3 10*3/uL (ref 0.2–1.0)
MONOS PCT: 5 %
Neutro Abs: 5.6 10*3/uL (ref 1.4–6.5)
Neutrophils Relative %: 80 %
Platelets: 241 10*3/uL (ref 150–440)
RBC: 4.43 MIL/uL (ref 4.40–5.90)
RDW: 13 % (ref 11.5–14.5)
WBC: 7.1 10*3/uL (ref 3.8–10.6)

## 2016-10-24 LAB — GLUCOSE, CAPILLARY: GLUCOSE-CAPILLARY: 291 mg/dL — AB (ref 65–99)

## 2016-10-24 MED ORDER — LACTULOSE 10 GM/15ML PO SOLN: 20.0000 g | Freq: Every day | ORAL | 0 refills | Status: DC | PRN

## 2016-10-24 MED ORDER — SODIUM CHLORIDE 0.9 % IV BOLUS (SEPSIS)
1000.0000 mL | Freq: Once | INTRAVENOUS | Status: AC
  Administered 2016-10-24: 1000 mL via INTRAVENOUS

## 2016-10-24 MED ORDER — TAMSULOSIN HCL 0.4 MG PO CAPS: 0.4000 mg | ORAL_CAPSULE | Freq: Every day | ORAL | 0 refills | Status: DC

## 2016-10-24 MED ORDER — INSULIN ASPART 100 UNIT/ML ~~LOC~~ SOLN
10.0000 [IU] | Freq: Once | SUBCUTANEOUS | Status: AC
  Administered 2016-10-24: 10 [IU] via INTRAVENOUS
  Filled 2016-10-24: qty 10

## 2016-10-24 NOTE — Discharge Instructions (Addendum)
1. Wear bladder catheter as instructed. 2. Take Flomax daily 14 days. 2. Take Lactulose as needed for bowel movements. 4. Return to the ER for worsening symptoms, fever, persistent vomiting or other concerns.

## 2016-10-25 LAB — URINE CULTURE
Culture: NO GROWTH
SPECIAL REQUESTS: NORMAL

## 2016-11-03 ENCOUNTER — Ambulatory Visit (INDEPENDENT_AMBULATORY_CARE_PROVIDER_SITE_OTHER): Payer: BC Managed Care – PPO | Admitting: Urology

## 2016-11-03 ENCOUNTER — Telehealth: Payer: Self-pay | Admitting: Family Medicine

## 2016-11-03 ENCOUNTER — Encounter: Payer: Self-pay | Admitting: Urology

## 2016-11-03 VITALS — BP 85/57 | HR 99 | Ht 72.0 in | Wt 201.3 lb

## 2016-11-03 DIAGNOSIS — N138 Other obstructive and reflux uropathy: Secondary | ICD-10-CM

## 2016-11-03 DIAGNOSIS — N401 Enlarged prostate with lower urinary tract symptoms: Secondary | ICD-10-CM

## 2016-11-03 DIAGNOSIS — R3916 Straining to void: Secondary | ICD-10-CM

## 2016-11-03 DIAGNOSIS — R339 Retention of urine, unspecified: Secondary | ICD-10-CM

## 2016-11-03 NOTE — Progress Notes (Signed)
Fill and Pull Catheter Removal  Patient is present today for a catheter removal.  Patient was cleaned and prepped in a sterile fashion of sterile water/ saline was instilled into the bladder when the patient felt the urge to urinate. 10ml of water was then drained from the balloon.  A 16FR foley cath was removed from the bladder no complications were noted .  Patient as then given some time to void on their own.  Patient can void  on their own after some time.  Patient tolerated well.  Preformed by: Teressa Lower, CMA

## 2016-11-03 NOTE — Progress Notes (Addendum)
11/03/2016 8:53 AM   Keith [name] 02-20-1971 [id number]  Referring provider: [facility] [address] [place], [place] [address]  Chief Complaint  Patient presents with  . Urinary Retention    HPI: Consultation for urinary retention. Patient is a 46 year old white male insulin-dependent diabetic who initially presented April 4 with dysuria. He had a pansensitive staph on urine culture. UA showed many bacteria with too numerous to count white cells. He was treated with cephalexin. He presented again April 13 now with trouble urinating. Repeat urine culture was negative. A bladder scan revealed greater than a liter in the bladder and the Foley was placed draining his bladder. He was constipated. A CT scan  which was normal. Bladder was decompressed with the Foley. He was started on tamsulosin. Prostate with mild BPH on exam and CT. Prior to foley he was having to strain to void.   Neurogenic wrist includes insulin-dependent diabetes. He was not taking insulin for 6 months and began to develop complications. He has PN. He's lost a lot of weight.  He has ED. He can't get an erection. Started 3-4 years ago. He has no AM erections. Tried Viagra.    PMH: Past Medical History:  Diagnosis Date  . Diabetes mellitus without complication (HCC)   . Hyperlipidemia   . Hypertension   . Neuropathy     Surgical History: Past Surgical History:  Procedure Laterality Date  . IRRIGATION AND DEBRIDEMENT FOOT Left 04/02/2016   Procedure: IRRIGATION AND DEBRIDEMENT FOOT;  Surgeon: Gwyneth Revels, DPM;  Location: [facility] ORS;  Service: Podiatry;  Laterality: Left;    Home Medications:  Allergies as of 11/03/2016      Reactions   Daptomycin Shortness Of Breath   Breathing issues   Lisinopril    Cough      Medication List       Accurate as of 11/03/16  8:53 AM. Always use your most recent med list.          aspirin 81 MG tablet Take 81 mg by mouth daily.   atorvastatin 40 MG  tablet Commonly known as:  LIPITOR Take 40 mg by mouth daily.   gabapentin 800 MG tablet Commonly known as:  NEURONTIN Take 1,200 mg by mouth 3 (three) times daily.   ibuprofen 200 MG tablet Commonly known as:  ADVIL,MOTRIN Take 200 mg by mouth every 6 (six) hours as needed.   insulin aspart 100 UNIT/ML injection Commonly known as:  novoLOG Inject 10 Units into the skin 3 (three) times daily before meals. Per sliding scale   insulin glargine 100 UNIT/ML injection Commonly known as:  LANTUS 20 units subcutaneous injection in am 80 units subcutaneous injection at bedtime   lactulose 10 GM/15ML solution Commonly known as:  CHRONULAC Take 30 mLs (20 g total) by mouth daily as needed for mild constipation.   losartan 50 MG tablet Commonly known as:  COZAAR Take 50 mg by mouth daily.   metFORMIN 500 MG tablet Commonly known as:  GLUCOPHAGE Take 1 tablet (500 mg total) by mouth 2 (two) times daily with a meal.   phenazopyridine 95 MG tablet Commonly known as:  PYRIDIUM Take 95 mg by mouth 3 (three) times daily as needed for pain.   tamsulosin 0.4 MG Caps capsule Commonly known as:  FLOMAX Take 1 capsule (0.4 mg total) by mouth daily.       Allergies:  Allergies  Allergen Reactions  . Daptomycin Shortness Of Breath    Breathing  issues  . Lisinopril     Cough    Family History: Family History  Problem Relation Age of Onset  . Prostate cancer Father   . Bladder Cancer Neg Hx   . Kidney cancer Neg Hx     Social History:  reports that he has never smoked. He has never used smokeless tobacco. He reports that he does not drink alcohol or use drugs.  ROS: UROLOGY Frequent Urination?: No Hard to postpone urination?: No Burning/pain with urination?: Yes Get up at night to urinate?: Yes Leakage of urine?: No Urine stream starts and stops?: Yes Trouble starting stream?: Yes Do you have to strain to urinate?: Yes Blood in urine?: Yes Urinary tract infection?:  Yes Sexually transmitted disease?: No Injury to kidneys or bladder?: No Painful intercourse?: No Weak stream?: No Erection problems?: Yes Penile pain?: Yes  Gastrointestinal Nausea?: No Vomiting?: No Indigestion/heartburn?: No Diarrhea?: Yes Constipation?: Yes  Constitutional Fever: Yes Night sweats?: Yes Weight loss?: Yes Fatigue?: Yes  Skin Skin rash/lesions?: No Itching?: No  Eyes Blurred vision?: Yes Double vision?: No  Ears/Nose/Throat Sore throat?: No Sinus problems?: No  Hematologic/Lymphatic Swollen glands?: No Easy bruising?: No  Cardiovascular Leg swelling?: No Chest pain?: No  Respiratory Cough?: No Shortness of breath?: No  Endocrine Excessive thirst?: No  Musculoskeletal Back pain?: Yes Joint pain?: Yes  Neurological Headaches?: No Dizziness?: Yes  Psychologic Depression?: No Anxiety?: No  Physical Exam: BP (!) 85/57 (BP Location: Right Arm, Patient Position: Sitting, Cuff Size: Normal)   Pulse 99   Ht 6' (1.829 m)   Wt 91.3 kg (201 lb 4.8 oz)   BMI 27.30 kg/m   Constitutional:  Alert and oriented, No acute distress. HEENT: [place] AT, moist mucus membranes.  Trachea midline, no masses. Cardiovascular: No clubbing, cyanosis, or edema. Respiratory: Normal respiratory effort, no increased work of breathing. GI: Abdomen is soft, nontender, nondistended, no abdominal masses GU: No CVA tenderness.  Penis - circumcised, normal foreskin Scrotum - norma, testes palpably normal DRE - 30 g prostate, smooth, no hard are or nodule  Skin: No rashes, bruises or suspicious lesions. Lymph: No cervical or inguinal adenopathy. Neurologic: Grossly intact, no focal deficits, moving all 4 extremities. Psychiatric: Normal mood and affect.  Laboratory Data: Lab Results  Component Value Date   WBC 7.1 10/24/2016   HGB 12.1 (L) 10/24/2016   HCT 36.0 (L) 10/24/2016   MCV 81.4 10/24/2016   PLT 241 10/24/2016    Lab Results  Component Value Date    CREATININE 1.08 10/24/2016    No results found for: PSA  No results found for: TESTOSTERONE  Lab Results  Component Value Date   HGBA1C 13.9 (H) 04/01/2016    Urinalysis    Component Value Date/Time   COLORURINE STRAW (A) 10/23/2016 2317   APPEARANCEUR CLEAR (A) 10/23/2016 2317   LABSPEC 1.022 10/23/2016 2317   PHURINE 7.0 10/23/2016 2317   GLUCOSEU >=500 (A) 10/23/2016 2317   HGBUR MODERATE (A) 10/23/2016 2317   BILIRUBINUR NEGATIVE 10/23/2016 2317   KETONESUR NEGATIVE 10/23/2016 2317   PROTEINUR NEGATIVE 10/23/2016 2317   NITRITE NEGATIVE 10/23/2016 2317   LEUKOCYTESUR NEGATIVE 10/23/2016 2317    Pertinent Imaging: CT   Assessment & Plan:   1) urinary retention - likely from constipation and 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 Passed void trial -- covered with a Cipro 500 mg po x 1. Check PVR in 1-2 weeks.   2) BPH - mild on exam and CT, but could contribute to retention.  Continue tamsulosin. Discussed risks of alpha blockers.   3) ED - discuss options at f/u -- pt asked about it.   There are no diagnoses linked to this encounter.  Addendum: Misty Stanley said the patient call back to ask why his low blood pressure was not addressed at his appt. I did see the blood pressure but the patient looked well. He was in no distress. He was walking around the room normally and to checkout normally. He did not mention any issues of feeling poorly. On the contrary, he seemed happy to get the catheter out and even brought up other quality-of-life issues such as erectile dysfunction and how to address this. I did speak with Lyla Son and will have her call him back and let him know if he is now feeling poorly he should go to the ED for evaluation.   No Follow-up on file.  Jerilee Field, MD  [facility] [address] [place], [place] [address] [phone]

## 2016-11-03 NOTE — Telephone Encounter (Signed)
Spoke to patient this afternoon to check up on him after taking the catheter out. He states he has not urinated since he left the office. He has had 2 bottles of water today and he informed me that his wife is going to bring home IV fluids to help him. She thinks he is dehydrated and needs fluids so he can urinate. I did inform him that if he is not urinating now he may want to be cautious about using IV fluid I expressed concern he may go in to urinary retention over night and need treatment at the ER. I asked him how he was feeling since his BP was low this morning he stated he was feeling ok just that when he takes Flomax it makes him a little dizzy. I advised him per [place] he should take the Flomax at night if it was making him feel dizzy.

## 2016-11-04 ENCOUNTER — Ambulatory Visit (INDEPENDENT_AMBULATORY_CARE_PROVIDER_SITE_OTHER): Payer: BC Managed Care – PPO | Admitting: Family Medicine

## 2016-11-04 VITALS — BP 92/60 | HR 111 | Ht 72.0 in | Wt 201.0 lb

## 2016-11-04 DIAGNOSIS — R339 Retention of urine, unspecified: Secondary | ICD-10-CM | POA: Diagnosis not present

## 2016-11-04 LAB — BLADDER SCAN AMB NON-IMAGING: Scan Result: 1251

## 2016-11-04 MED ORDER — TAMSULOSIN HCL 0.4 MG PO CAPS: 0.4000 mg | ORAL_CAPSULE | Freq: Every day | ORAL | 1 refills | Status: DC

## 2016-11-04 MED ORDER — TAMSULOSIN HCL 0.4 MG PO CAPS: 0.4000 mg | ORAL_CAPSULE | Freq: Every day | ORAL | 0 refills | Status: DC

## 2016-11-04 NOTE — Progress Notes (Signed)
Simple Catheter Placement  Due to urinary retention patient is present today for a foley cath placement.  Patient was cleaned and prepped in a sterile fashion with betadine and lidocaine jelly 2% was instilled into the urethra.  A 16 FR foley catheter was inserted, urine return was noted  , urine was yellow in color.  The balloon was filled with 10cc of sterile water.  A leg bag was attached for drainage. Patient was also given a night bag to take home and was given instruction on how to change from one bag to another.  Patient was given instruction on proper catheter care.  Patient tolerated well, no complications were noted   Preformed by: Teressa Lower, CMA  Additional notes/ Follow up: 2 weeks for Voiding trial

## 2016-11-06 ENCOUNTER — Emergency Department
Admission: EM | Admit: 2016-11-06 | Discharge: 2016-11-06 | Disposition: A | Discharge status: Home / Self Care | Payer: BC Managed Care – PPO | Attending: Student in an Organized Health Care Education/Training Program | Admitting: Student in an Organized Health Care Education/Training Program

## 2016-11-06 ENCOUNTER — Encounter: Payer: Self-pay | Admitting: *Deleted

## 2016-11-06 ENCOUNTER — Emergency Department: Payer: BC Managed Care – PPO

## 2016-11-06 DIAGNOSIS — Z7982 Long term (current) use of aspirin: Secondary | ICD-10-CM | POA: Insufficient documentation

## 2016-11-06 DIAGNOSIS — Z794 Long term (current) use of insulin: Secondary | ICD-10-CM | POA: Diagnosis not present

## 2016-11-06 DIAGNOSIS — R1084 Generalized abdominal pain: Secondary | ICD-10-CM

## 2016-11-06 DIAGNOSIS — K5900 Constipation, unspecified: Secondary | ICD-10-CM | POA: Diagnosis not present

## 2016-11-06 DIAGNOSIS — E119 Type 2 diabetes mellitus without complications: Secondary | ICD-10-CM | POA: Insufficient documentation

## 2016-11-06 DIAGNOSIS — I10 Essential (primary) hypertension: Secondary | ICD-10-CM | POA: Insufficient documentation

## 2016-11-06 LAB — BASIC METABOLIC PANEL
ANION GAP: 11 (ref 5–15)
BUN: 13 mg/dL (ref 6–20)
CALCIUM: 9.1 mg/dL (ref 8.9–10.3)
CO2: 23 mmol/L (ref 22–32)
CREATININE: 1.2 mg/dL (ref 0.61–1.24)
Chloride: 99 mmol/L — ABNORMAL LOW (ref 101–111)
GFR calc non Af Amer: >60 mL/min (ref >60)
Glucose, Bld: 186 mg/dL — ABNORMAL HIGH (ref 65–99)
Potassium: 4.4 mmol/L (ref 3.5–5.1)
SODIUM: 133 mmol/L — AB (ref 135–145)

## 2016-11-06 LAB — CBC WITH DIFFERENTIAL/PLATELET
BASOS ABS: 0 10*3/uL (ref 0–0.1)
BASOS PCT: 0 %
EOS ABS: 0 10*3/uL (ref 0–0.7)
Eosinophils Relative: 0 %
HCT: 35.4 % — ABNORMAL LOW (ref 40.0–52.0)
HEMOGLOBIN: 12.1 g/dL — AB (ref 13.0–18.0)
Lymphocytes Relative: 5 %
Lymphs Abs: 0.6 10*3/uL — ABNORMAL LOW (ref 1.0–3.6)
MCH: 27.5 pg (ref 26.0–34.0)
MCHC: 34.1 g/dL (ref 32.0–36.0)
MCV: 80.6 fL (ref 80.0–100.0)
MONOS PCT: 6 %
Monocytes Absolute: 0.7 10*3/uL (ref 0.2–1.0)
NEUTROS PCT: 89 %
Neutro Abs: 11 10*3/uL — ABNORMAL HIGH (ref 1.4–6.5)
Platelets: 259 10*3/uL (ref 150–440)
RBC: 4.4 MIL/uL (ref 4.40–5.90)
RDW: 13.6 % (ref 11.5–14.5)
WBC: 12.3 10*3/uL — ABNORMAL HIGH (ref 3.8–10.6)

## 2016-11-06 LAB — URINALYSIS, COMPLETE (UACMP) WITH MICROSCOPIC
Bilirubin Urine: NEGATIVE
GLUCOSE, UA: NEGATIVE mg/dL
Ketones, ur: NEGATIVE mg/dL
NITRITE: NEGATIVE
PH: 5 (ref 5.0–8.0)
Protein, ur: 100 mg/dL — AB
SPECIFIC GRAVITY, URINE: 1.018 (ref 1.005–1.030)
Squamous Epithelial / LPF: NONE SEEN

## 2016-11-06 MED ORDER — POLYETHYLENE GLYCOL 3350 17 G PO PACK: 17.0000 g | PACK | Freq: Every day | ORAL | 0 refills | Status: DC

## 2016-11-06 MED ORDER — LIDOCAINE HCL 2 % EX GEL
CUTANEOUS | Status: AC
  Filled 2016-11-06: qty 10

## 2016-11-06 MED ORDER — DOCUSATE SODIUM 100 MG PO CAPS
100.0000 mg | ORAL_CAPSULE | Freq: Once | ORAL | Status: AC
  Administered 2016-11-06: 100 mg via ORAL
  Filled 2016-11-06: qty 1

## 2016-11-06 MED ORDER — LIDOCAINE HCL 2 % EX GEL
1.0000 "application " | Freq: Once | CUTANEOUS | Status: AC
  Administered 2016-11-06: 1
  Filled 2016-11-06: qty 5

## 2016-11-06 MED ORDER — MAGNESIUM CITRATE PO SOLN: 1.0000 | Freq: Once | ORAL | 0 refills | Status: AC

## 2016-11-06 MED ORDER — MAGNESIUM CITRATE PO SOLN
1.0000 | Freq: Once | ORAL | Status: AC
  Administered 2016-11-06: 1 via ORAL

## 2016-11-06 MED ORDER — MAGNESIUM CITRATE PO SOLN
1.0000 | Freq: Once | ORAL | Status: DC
  Filled 2016-11-06: qty 296

## 2016-11-06 NOTE — ED Provider Notes (Signed)
[facility] Emergency Department Provider Note    First MD Initiated Contact with Patient 11/06/16 Keith [name]     (approximate)  I have reviewed the triage vital signs and the nursing notes.   HISTORY  Chief Complaint Constipation    HPI Keith [name] is a 46 y.o. male presents with chief complaint of constipation and feeling of tenesmus. Patient with recent placement of Foley catheter due to acute urinary retention. Has been struggling with worsening constipation for the past several weeks. No blood in his stools. No fevers. Denies any abdominal pain nausea or vomiting. States that he's having decreased sleep due to inability to move his bowels. No diarrhea. Has tried docusate and Dulcolax without any improvement.   Past Medical History:  Diagnosis Date  . Diabetes mellitus without complication (HCC)   . Hyperlipidemia   . Hypertension   . Neuropathy    Family History  Problem Relation Age of Onset  . Prostate cancer Father   . Bladder Cancer Neg Hx   . Kidney cancer Neg Hx    Past Surgical History:  Procedure Laterality Date  . IRRIGATION AND DEBRIDEMENT FOOT Left 04/02/2016   Procedure: IRRIGATION AND DEBRIDEMENT FOOT;  Surgeon: Gwyneth Revels, DPM;  Location: [facility] ORS;  Service: Podiatry;  Laterality: Left;   Patient Active Problem List   Diagnosis Date Noted  . Diabetic foot ulcer (HCC) 03/31/2016      Prior to Admission medications   Medication Sig Start Date End Date Taking? Authorizing Provider  aspirin 81 MG tablet Take 81 mg by mouth daily.    Historical Provider, MD  atorvastatin (LIPITOR) 40 MG tablet Take 40 mg by mouth daily.    Historical Provider, MD  gabapentin (NEURONTIN) 800 MG tablet Take 1,200 mg by mouth 3 (three) times daily.    Historical Provider, MD  ibuprofen (ADVIL,MOTRIN) 200 MG tablet Take 200 mg by mouth every 6 (six) hours as needed.    Historical Provider, MD  insulin aspart (NOVOLOG) 100 UNIT/ML injection Inject  10 Units into the skin 3 (three) times daily before meals. Per sliding scale 04/04/16   Alford Highland, MD  insulin glargine (LANTUS) 100 UNIT/ML injection 20 units subcutaneous injection in am 80 units subcutaneous injection at bedtime 04/04/16   Alford Highland, MD  lactulose (CHRONULAC) 10 GM/15ML solution Take 30 mLs (20 g total) by mouth daily as needed for mild constipation. Patient not taking: Reported on 11/03/2016 10/24/16   Irean Hong, MD  losartan (COZAAR) 50 MG tablet Take 50 mg by mouth daily.    Historical Provider, MD  metFORMIN (GLUCOPHAGE) 500 MG tablet Take 1 tablet (500 mg total) by mouth 2 (two) times daily with a meal. Patient not taking: Reported on 11/03/2016 10/15/16 10/15/17  Willy Eddy, MD  phenazopyridine (PYRIDIUM) 95 MG tablet Take 95 mg by mouth 3 (three) times daily as needed for pain.    Historical Provider, MD  tamsulosin (FLOMAX) 0.4 MG CAPS capsule Take 1 capsule (0.4 mg total) by mouth daily. 11/04/16   Harle Battiest, PA-C    Allergies Daptomycin and Lisinopril    Social History Social History  Substance Use Topics  . Smoking status: Never Smoker  . Smokeless tobacco: Never Used  . Alcohol use No    Review of Systems Patient denies headaches, rhinorrhea, blurry vision, numbness, shortness of breath, chest pain, edema, cough, abdominal pain, nausea, vomiting, diarrhea, dysuria, fevers, rashes or hallucinations unless otherwise stated above in HPI. ____________________________________________  PHYSICAL EXAM:  VITAL SIGNS: Vitals:   11/06/16 1731  BP: 95/68  Pulse: (!) 110  Resp: 20  Temp: 98.7 F (37.1 C)    Constitutional: Alert and oriented. Well appearing and in no acute distress. Eyes: Conjunctivae are normal. PERRL. EOMI. Head: Atraumatic. Nose: No congestion/rhinnorhea. Mouth/Throat: Mucous membranes are moist.  Oropharynx non-erythematous. Neck: No stridor. Painless ROM. No cervical spine tenderness to  palpation Hematological/Lymphatic/Immunilogical: No cervical lymphadenopathy. Cardiovascular: Normal rate, regular rhythm. Grossly normal heart sounds.  Good peripheral circulation. Respiratory: Normal respiratory effort.  No retractions. Lungs CTAB. Gastrointestinal: Soft and nontender. No distention. No abdominal bruits. No CVA tenderness. Genitourinary: foley in place, otherwise normal external genitalia,  No hard formed stool in rectal vault,  No masses Musculoskeletal: No lower extremity tenderness nor edema.  No joint effusions. Neurologic:  Normal speech and language. No gross focal neurologic deficits are appreciated. No gait instability.  Good rectal tone, no saddle anesthesia Skin:  Skin is warm, dry and intact. No rash noted. Psychiatric: Mood and affect are normal. Speech and behavior are normal.  ____________________________________________   LABS (all labs ordered are listed, but only abnormal results are displayed)  Results for orders placed or performed during the hospital encounter of 11/06/16 (from the past 24 hour(s))  CBC with Differential     Status: Abnormal   Collection Time: 11/06/16  5:58 PM  Result Value Ref Range   WBC 12.3 (H) 3.8 - 10.6 K/uL   RBC 4.40 4.40 - 5.90 MIL/uL   Hemoglobin 12.1 (L) 13.0 - 18.0 g/dL   HCT 16.1 (L) 09.6 - 04.5 %   MCV 80.6 80.0 - 100.0 fL   MCH 27.5 26.0 - 34.0 pg   MCHC 34.1 32.0 - 36.0 g/dL   RDW 40.9 81.1 - 91.4 %   Platelets 259 150 - 440 K/uL   Neutrophils Relative % 89 %   Neutro Abs 11.0 (H) 1.4 - 6.5 K/uL   Lymphocytes Relative 5 %   Lymphs Abs 0.6 (L) 1.0 - 3.6 K/uL   Monocytes Relative 6 %   Monocytes Absolute 0.7 0.2 - 1.0 K/uL   Eosinophils Relative 0 %   Eosinophils Absolute 0.0 0 - 0.7 K/uL   Basophils Relative 0 %   Basophils Absolute 0.0 0 - 0.1 K/uL  Basic metabolic panel     Status: Abnormal   Collection Time: 11/06/16  5:58 PM  Result Value Ref Range   Sodium 133 (L) 135 - 145 mmol/L   Potassium 4.4  3.5 - 5.1 mmol/L   Chloride 99 (L) 101 - 111 mmol/L   CO2 23 22 - 32 mmol/L   Glucose, Bld 186 (H) 65 - 99 mg/dL   BUN 13 6 - 20 mg/dL   Creatinine, Ser 7.82 0.61 - 1.24 mg/dL   Calcium 9.1 8.9 - 95.6 mg/dL   GFR calc non Af Amer >60 >60 mL/min   GFR calc Af Amer >60 >60 mL/min   Anion gap 11 5 - 15  Urinalysis, Complete w Microscopic     Status: Abnormal   Collection Time: 11/06/16  5:58 PM  Result Value Ref Range   Color, Urine GREEN (A) YELLOW   APPearance CLOUDY (A) CLEAR   Specific Gravity, Urine 1.018 1.005 - 1.030   pH 5.0 5.0 - 8.0   Glucose, UA NEGATIVE NEGATIVE mg/dL   Hgb urine dipstick SMALL (A) NEGATIVE   Bilirubin Urine NEGATIVE NEGATIVE   Ketones, ur NEGATIVE NEGATIVE mg/dL   Protein, ur 213 (  A) NEGATIVE mg/dL   Nitrite NEGATIVE NEGATIVE   Leukocytes, UA LARGE (A) NEGATIVE   RBC / HPF TOO NUMEROUS TO COUNT 0 - 5 RBC/hpf   WBC, UA TOO NUMEROUS TO COUNT 0 - 5 WBC/hpf   Bacteria, UA RARE (A) NONE SEEN   Squamous Epithelial / LPF NONE SEEN NONE SEEN   WBC Clumps PRESENT    Mucous PRESENT    ____________________________________________ ____________________________________________  RADIOLOGY  I personally reviewed all radiographic images ordered to evaluate for the above acute complaints and reviewed radiology reports and findings.  These findings were personally discussed with the patient.  Please see medical record for radiology report.  ____________________________________________   PROCEDURES  Procedure(s) performed:  Procedures    Critical Care performed: no ____________________________________________   INITIAL IMPRESSION / ASSESSMENT AND PLAN / ED COURSE  Pertinent labs & imaging results that were available during my care of the patient were reviewed by me and considered in my medical decision making (see chart for details).  DDX: constipation, obstipation, fecal impaction, electroyle abn  Keith E Portilla is a 46 y.o. who presents to the ED  with concern for constipation and tenesmus.  Patient is AFVSS in ED. Exam as above. Given current presentation have considered the above differential.  Patient is otherwise well-appearing and well perfused. His abdominal exam is soft and benign. X-ray ordered out of triage shows nonspecific bowel gas pattern. Radiology report considered small bowel or partial obstruction. Based on his symptoms, lack of nausea or vomiting and benign abdominal exam I do not feel this is the clinical process. Based on his history will perform rectal exam to evaluate for any stenosis, mass or fecal impaction.  Clinical Course as of Nov 07 2202  Thu Nov 06, 2016  1922 ------------------------------------------------------------------------------------------------------------------- Fecal Disimpaction Procedure Note:  Performed by me:  Patient placed in the lateral recumbent position with knees drawn towards chest. Nurse present for patient support. Small amount of soft brown stool removed. No complications during procedure.   ------------------------------------------------------------------------------------------------------------------    [PR]  1610 Will proceed with enema.    [PR]  2026 Patient with large volume stool after enema. States his symptoms have significantly improved. States that he feels "great". We will PO challenge.  [PR]  2039 Patient tolerating PO.  Not consistent with SBO or ileus.  PAtient feels much improved.  Will DC with miralax and mag citrate.  Have discussed with the patient and available family all diagnostics and treatments performed thus far and all questions were answered to the best of my ability. The patient demonstrates understanding and agreement with plan.   [PR]    Clinical Course User Index [PR] Willy Eddy, MD     ____________________________________________   FINAL CLINICAL IMPRESSION(S) / ED DIAGNOSES  Final diagnoses:  Generalized abdominal pain   Constipation, unspecified constipation type      NEW MEDICATIONS STARTED DURING THIS VISIT:  New Prescriptions   No medications on file     Note:  This document was prepared using Dragon voice recognition software and may include unintentional dictation errors.    Willy Eddy, MD 11/06/16 2207

## 2016-11-06 NOTE — ED Notes (Signed)

## 2016-11-06 NOTE — ED Triage Notes (Signed)
Pt has foley catheter for urinary retention, pt reports being constipated with a small bowel movement yesterday, pt reports abdominal pain

## 2016-11-14 ENCOUNTER — Ambulatory Visit (INDEPENDENT_AMBULATORY_CARE_PROVIDER_SITE_OTHER): Payer: BC Managed Care – PPO | Admitting: Podiatry

## 2016-11-14 ENCOUNTER — Encounter: Payer: Self-pay | Admitting: Podiatry

## 2016-11-14 VITALS — Temp 99.4°F

## 2016-11-14 DIAGNOSIS — I70235 Atherosclerosis of native arteries of right leg with ulceration of other part of foot: Secondary | ICD-10-CM | POA: Diagnosis not present

## 2016-11-14 DIAGNOSIS — L97522 Non-pressure chronic ulcer of other part of left foot with fat layer exposed: Secondary | ICD-10-CM

## 2016-11-14 DIAGNOSIS — E0842 Diabetes mellitus due to underlying condition with diabetic polyneuropathy: Secondary | ICD-10-CM

## 2016-11-14 DIAGNOSIS — L97512 Non-pressure chronic ulcer of other part of right foot with fat layer exposed: Secondary | ICD-10-CM | POA: Diagnosis not present

## 2016-11-14 DIAGNOSIS — I70245 Atherosclerosis of native arteries of left leg with ulceration of other part of foot: Secondary | ICD-10-CM | POA: Diagnosis not present

## 2016-11-14 MED ORDER — SULFAMETHOXAZOLE-TRIMETHOPRIM 400-80 MG PO TABS: 1.0000 | ORAL_TABLET | Freq: Two times a day (BID) | ORAL | 0 refills | Status: DC

## 2016-11-17 NOTE — Progress Notes (Signed)
11/18/2016 9:33 AM   Keith [name] 02/19/1971 [id number]  Referring provider: [facility], [facility] Primary [address] [place], [place] [address]  Chief Complaint  Patient presents with  . Benign Prostatic Hypertrophy    2 week follow up  . Urinary Retention    HPI: 46 yo WM with uncontrolled type 1 DM who has failed a TOV and is experiencing ED who presents today for PVR and further discussion regarding his ED with his wife, Keith [name].    Background history  Consultation for urinary retention. Patient is a 46 year old white male insulin-dependent diabetic who initially presented April 4 with dysuria. He had a pansensitive staph on urine culture. UA showed many bacteria with too numerous to count white cells. He was treated with cephalexin. He presented again April 13 now with trouble urinating. Repeat urine culture was negative. A bladder scan revealed greater than a liter in the bladder and the Foley was placed draining his bladder. He was constipated. A CT scan  which was normal. Bladder was decompressed with the Foley. He was started on tamsulosin. Prostate with mild BPH on exam and CT. Prior to foley he was having to strain to void.  Neurogenic wrist includes insulin-dependent diabetes. He was not taking insulin for 6 months and began to develop complications. He has PN. He's lost a lot of weight.  BPH WITH LUTS His IPSS score today is 22, which is severe lower urinary tract symptomatology. He is terrible with his quality life due to his urinary symptoms.   He currently has an indwelling Foley.  He failed a TOV on 11/04/2016 with 1225 mL in his bladder.  His major complaint today is straining to urinate and having a Foley.  He has had these symptoms for one month.  He denies any dysuria, hematuria or suprapubic pain.  He currently taking tamsulosin 0.4 mg daily.  He also denies any recent fevers, chills, nausea or vomiting.  His father was diagnosed with a highly aggressive prostate cancer.        IPSS    Row Name 11/18/16 0800         International Prostate Symptom Score   How often have you had the sensation of not emptying your bladder? About half the time     How often have you had to urinate less than every two hours? About half the time     How often have you found you stopped and started again several times when you urinated? More than half the time     How often have you found it difficult to postpone urination? Not at All     How often have you had a weak urinary stream? Almost always     How often have you had to strain to start urination? Almost always     How many times did you typically get up at night to urinate? 2 Times     Total IPSS Score 22       Quality of Life due to urinary symptoms   If you were to spend the rest of your life with your urinary condition just the way it is now how would you feel about that? Terrible        Score:  1-7 Mild 8-19 Moderate 20-35 Severe   Erectile dysfunction His SHIM score is 13, which is mild to moderate ED.   He has been having difficulty with erections for 3 to 4 years ago.   His major complaint is no  erections.  His libido is diminshed.   His risk factors for ED are age, DM and HLD.  He denies any painful erections or curvatures with his erections.   He is no longer having spontaneous erections.  He has tried Viagra in the past with no success.     SHIM    Row Name 11/18/16 0900         SHIM: Over the last 6 months:   How do you rate your confidence that you could get and keep an erection? Very Low     When you had erections with sexual stimulation, how often were your erections hard enough for penetration (entering your partner)? A Few Times (much less than half the time)     During sexual intercourse, how often were you able to maintain your erection after you had penetrated (entered) your partner? A Few Times (much less than half the time)     During sexual intercourse, how difficult was it to maintain your  erection to completion of intercourse? Difficult     When you attempted sexual intercourse, how often was it satisfactory for you? Almost Always or Always       SHIM Total Score   SHIM 13        Score: 1-7 Severe ED 8-11 Moderate ED 12-16 Mild-Moderate ED 17-21 Mild ED 22-25 No ED  PMH: Past Medical History:  Diagnosis Date  . Diabetes mellitus without complication (HCC)   . Hyperlipidemia   . Hypertension   . Neuropathy     Surgical History: Past Surgical History:  Procedure Laterality Date  . IRRIGATION AND DEBRIDEMENT FOOT Left 04/02/2016   Procedure: IRRIGATION AND DEBRIDEMENT FOOT;  Surgeon: Gwyneth Revels, DPM;  Location: [facility] ORS;  Service: Podiatry;  Laterality: Left;    Home Medications:  Allergies as of 11/18/2016      Reactions   Daptomycin Shortness Of Breath   Breathing issues   Lisinopril    Cough      Medication List       Accurate as of 11/18/16  9:33 AM. Always use your most recent med list.          acetaminophen 500 MG tablet Commonly known as:  TYLENOL Take 500 mg by mouth every 6 (six) hours as needed.   aspirin 81 MG tablet Take 81 mg by mouth daily.   atorvastatin 40 MG tablet Commonly known as:  LIPITOR Take 40 mg by mouth daily.   gabapentin 800 MG tablet Commonly known as:  NEURONTIN Take 1,200 mg by mouth 3 (three) times daily.   ibuprofen 200 MG tablet Commonly known as:  ADVIL,MOTRIN Take 200 mg by mouth every 6 (six) hours as needed.   insulin aspart 100 UNIT/ML injection Commonly known as:  novoLOG Inject 10 Units into the skin 3 (three) times daily before meals. Per sliding scale   insulin glargine 100 UNIT/ML injection Commonly known as:  LANTUS 20 units subcutaneous injection in am 80 units subcutaneous injection at bedtime   lactulose 10 GM/15ML solution Commonly known as:  CHRONULAC Take 30 mLs (20 g total) by mouth daily as needed for mild constipation.   losartan 50 MG tablet Commonly known as:   COZAAR Take 50 mg by mouth daily.   metFORMIN 500 MG tablet Commonly known as:  GLUCOPHAGE Take 1 tablet (500 mg total) by mouth 2 (two) times daily with a meal.   phenazopyridine 95 MG tablet Commonly known as:  PYRIDIUM Take 95 mg by mouth  3 (three) times daily as needed for pain.   polyethylene glycol packet Commonly known as:  MIRALAX / GLYCOLAX Take 17 g by mouth daily. Mix one tablespoon with 8oz of your favorite juice or water every day until you are having soft formed stools. Then start taking once daily if you didn't have a stool the day before.   sulfamethoxazole-trimethoprim 400-80 MG tablet Commonly known as:  BACTRIM Take 1 tablet by mouth 2 (two) times daily.   tamsulosin 0.4 MG Caps capsule Commonly known as:  FLOMAX Take 1 capsule (0.4 mg total) by mouth daily.       Allergies:  Allergies  Allergen Reactions  . Daptomycin Shortness Of Breath    Breathing issues  . Lisinopril     Cough    Family History: Family History  Problem Relation Age of Onset  . Prostate cancer Father   . Bladder Cancer Neg Hx   . Kidney cancer Neg Hx     Social History:  reports that he has never smoked. He has never used smokeless tobacco. He reports that he does not drink alcohol or use drugs.  ROS: UROLOGY Frequent Urination?: No Hard to postpone urination?: No Burning/pain with urination?: No Get up at night to urinate?: No Leakage of urine?: No Urine stream starts and stops?: No Trouble starting stream?: Yes Do you have to strain to urinate?: No Blood in urine?: No Urinary tract infection?: No Sexually transmitted disease?: No Injury to kidneys or bladder?: No Painful intercourse?: No Weak stream?: No Erection problems?: Yes Penile pain?: No  Gastrointestinal Nausea?: No Vomiting?: No Indigestion/heartburn?: No Diarrhea?: No Constipation?: Yes  Constitutional Fever: No Night sweats?: No Weight loss?: No Fatigue?: Yes  Skin Skin rash/lesions?:  No Itching?: No  Eyes Blurred vision?: Yes Double vision?: No  Ears/Nose/Throat Sore throat?: No Sinus problems?: No  Hematologic/Lymphatic Swollen glands?: No Easy bruising?: No  Cardiovascular Leg swelling?: Yes Chest pain?: No  Respiratory Cough?: No Shortness of breath?: No  Endocrine Excessive thirst?: No  Musculoskeletal Back pain?: No Joint pain?: No  Neurological Headaches?: No Dizziness?: Yes  Psychologic Depression?: Yes Anxiety?: Yes  Physical Exam: BP (!) 90/58   Pulse (!) 106   Ht 6' (1.829 m)   Wt 201 lb 1.6 oz (91.2 kg)   BMI 27.27 kg/m   Constitutional:  Alert and oriented, No acute distress. HEENT: [place] AT, moist mucus membranes.  Trachea midline, no masses. Cardiovascular: No clubbing, cyanosis, or edema. Respiratory: Normal respiratory effort, no increased work of breathing. Skin: No rashes, bruises or suspicious lesions. Lymph: No cervical or inguinal adenopathy. Neurologic: Grossly intact, no focal deficits, moving all 4 extremities. Psychiatric: Normal mood and affect.  Laboratory Data: Lab Results  Component Value Date   WBC 12.3 (H) 11/06/2016   HGB 12.1 (L) 11/06/2016   HCT 35.4 (L) 11/06/2016   MCV 80.6 11/06/2016   PLT 259 11/06/2016    Lab Results  Component Value Date   CREATININE 1.20 11/06/2016     Lab Results  Component Value Date   HGBA1C 13.9 (H) 04/01/2016     Assessment & Plan:    1.  Urinary retention  - Failed voiding trial two weeks ago  - Discussed CIC teachings with the patient, he is very hesitant but he does not want to continue with an indwelling Foley at this time  - most liikely from PN from IDDM complications - explained to patient that he may need to consider CIC indefinitely in order to manage  his bladder  - Patient caths 3 times daily, indefinitely due to urinary retention   - Advised the patient to record PVRs and to contact the office with the values  2. Erectile dysfunction  - SHIM  score is 13  - I explained to the patient that in order to achieve an erection it takes good functioning of the nervous system (parasympathetic, sympathetic, sensory and motor), good blood flow into the erectile tissue of the penis and a desire to have sex  - I explained that conditions like diabetes, hypertension, coronary artery disease, peripheral vascular disease, smoking, alcohol consumption, age, sleep apnea and BPH can diminish the ability to have an erection  - A recent study published in Sex Med 2018 Apr 13 revealed moderate to vigorous aerobic exercise for 40 minutes 4 times per week can decrease erectile problems caused by physical inactivity, obesity, hypertension, metabolic syndrome and/or cardiovascular diseases  - We discussed trying a different PDE5 inhibitor and intracavernous vasoactive drug injection therapy - his wife would like to try Trimix injection - prescription called in for a test vial and patient will schedule a Trimix titration.      Return in about 1 month (around 12/19/2016) for IPSS and PVR.  Michiel CowboySHANNON Juandiego Kolenovic, PA-C  [facility] [address], [address] [place], [place] [address] [phone]

## 2016-11-17 NOTE — Progress Notes (Signed)
   Subjective:  Patient with a history of diabetes mellitus presents today for evaluation of pain to the right foot, ankle and leg that began yesterday. He reports a painful ulceration to the plantar aspect of the right foot. He reports associated swelling and redness to the medial right ankle. Patient presents today for further treatment and evaluation   Objective/Physical Exam General: The patient is alert and oriented x3 in no acute distress.  Dermatology:  Wound #1 noted to the sub-first MPJ left foot 0.50.50.2 cm (LxWxD).   Wound #2 noted to the sub-first MPJ right foot 0.50.50.2 cm (LxWxD).   To the noted ulceration(s), there is no eschar. There is a moderate amount of slough, fibrin, and necrotic tissue noted. Granulation tissue and wound base is red. There is a minimal amount of serosanguineous drainage noted. There is no exposed bone muscle-tendon ligament or joint. There is no malodor. Periwound integrity is intact. Skin is warm, dry and supple bilateral lower extremities.  Vascular: Palpable pedal pulses bilaterally. No edema or erythema noted. Capillary refill within normal limits.  Neurological: Epicritic and protective threshold absent bilaterally.   Musculoskeletal Exam: Range of motion within normal limits to all pedal and ankle joints bilateral. Muscle strength 5/5 in all groups bilateral.   Assessment: #1 Sub 1st MPJ ulcer to bilateral feet secondary to diabetes mellitus #2 diabetes mellitus w/ peripheral neuropathy #3 right ankle cellulitis    Plan of Care:  #1 Patient was evaluated. #2 medically necessary excisional debridement including subcutaneous tissue was performed using a tissue nipper and a chisel blade. Excisional debridement of all the necrotic nonviable tissue down to healthy bleeding viable tissue was performed with post-debridement measurements same as pre-. #3 the wound was cleansed and dry sterile dressing applied. #4 Prescription for Bactrim DS  #20 given. #5 Puracol provided for the patient #6 Hospital shoes dispensed x 2 #7 Instructed pt to go to the ED for worsening symptoms #8 patient is to return to clinic in 2 weeks.   Felecia ShellingBrent M. Evans, DPM [facility]  Dr. Felecia ShellingBrent M. Evans, DPM    [address]                                        [place], [place] [address]                Office [phone]  Fax [phone]

## 2016-11-18 ENCOUNTER — Ambulatory Visit: Payer: BC Managed Care – PPO | Admitting: Urology

## 2016-11-18 ENCOUNTER — Encounter: Payer: Self-pay | Admitting: Urology

## 2016-11-18 ENCOUNTER — Telehealth: Payer: Self-pay | Admitting: Urology

## 2016-11-18 VITALS — BP 90/58 | HR 106 | Ht 72.0 in | Wt 201.1 lb

## 2016-11-18 DIAGNOSIS — R339 Retention of urine, unspecified: Secondary | ICD-10-CM

## 2016-11-18 DIAGNOSIS — N529 Male erectile dysfunction, unspecified: Secondary | ICD-10-CM | POA: Diagnosis not present

## 2016-11-18 NOTE — Telephone Encounter (Signed)
After returning to pt room, pt and wife decided to discuss trimix and call back. Will order when call is returned.

## 2016-11-18 NOTE — Telephone Encounter (Signed)
Would you call in the test vial (3 mL) of Trimix for the patient?

## 2016-11-18 NOTE — Progress Notes (Signed)
Continuous Intermittent Catheterization  Due to inability to urinate patient is present today for a teaching of self I & O Catheterization. Patient was given detailed verbal and printed instructions of self catheterization. Patient was cleaned and prepped in a sterile fashion.  With instruction and assistance patient inserted a 14FR and urine return was noted 0 ml. Patient tolerated well, no complications were noted Patient was given a sample bag with supplies to take home.  Instructions were given per Carollee HerterShannon for patient to cath 3 times daily.  An order was placed with [facility] for catheters to be sent to the patient's home.   Preformed by: Rupert Stackshelsea Watkins, LPN   Additional Notes: 516fr foley was removed prior to CIC teaching.

## 2016-11-20 ENCOUNTER — Telehealth: Payer: Self-pay

## 2016-11-20 NOTE — Telephone Encounter (Signed)
Pt called stating he was able to urinate on his own during the night and again this morning. Pt wanted to know if he still needed to perform CIC. Reinforced with pt to continue CIC. Pt voiced understanding.

## 2016-11-24 ENCOUNTER — Other Ambulatory Visit: Payer: Self-pay

## 2016-11-24 ENCOUNTER — Telehealth: Payer: Self-pay | Admitting: Podiatry

## 2016-11-24 MED ORDER — SULFAMETHOXAZOLE-TRIMETHOPRIM 400-80 MG PO TABS: 1.0000 | ORAL_TABLET | Freq: Two times a day (BID) | ORAL | 0 refills | Status: DC

## 2016-11-24 NOTE — Progress Notes (Signed)
Rx for Bactrim DS has been sent to pharmacy per Dr. Logan BoresEvans.  Patient has been notified of rx and informed that If area continues to look red and infected, he is to go to ER.  Patient verbalized understanding

## 2016-11-24 NOTE — Telephone Encounter (Signed)
Patient called to advise that he took his last antibotic that dr. Logan BoresEvans prescribed and needs a refill. He doesn't see evans again until 12/02/16 for his cellulitis and he is still having sweeling and redness. Would like this called into [facility] in [place]. Thanks !

## 2016-11-27 ENCOUNTER — Encounter: Payer: Self-pay | Admitting: Emergency Medicine

## 2016-11-27 ENCOUNTER — Emergency Department: Payer: BC Managed Care – PPO

## 2016-11-27 ENCOUNTER — Inpatient Hospital Stay
Admission: EM | Admit: 2016-11-27 | Discharge: 2016-11-28 | DRG: 554 | Disposition: A | Discharge status: Home / Self Care | Payer: BC Managed Care – PPO | Attending: Internal Medicine | Admitting: Internal Medicine

## 2016-11-27 DIAGNOSIS — M86171 Other acute osteomyelitis, right ankle and foot: Secondary | ICD-10-CM | POA: Diagnosis present

## 2016-11-27 DIAGNOSIS — M25571 Pain in right ankle and joints of right foot: Secondary | ICD-10-CM

## 2016-11-27 DIAGNOSIS — E11621 Type 2 diabetes mellitus with foot ulcer: Secondary | ICD-10-CM | POA: Diagnosis present

## 2016-11-27 DIAGNOSIS — M14671 Charcot's joint, right ankle and foot: Secondary | ICD-10-CM | POA: Diagnosis not present

## 2016-11-27 DIAGNOSIS — I10 Essential (primary) hypertension: Secondary | ICD-10-CM | POA: Diagnosis present

## 2016-11-27 DIAGNOSIS — E1140 Type 2 diabetes mellitus with diabetic neuropathy, unspecified: Secondary | ICD-10-CM | POA: Diagnosis present

## 2016-11-27 DIAGNOSIS — M79671 Pain in right foot: Secondary | ICD-10-CM

## 2016-11-27 DIAGNOSIS — Z881 Allergy status to other antibiotic agents status: Secondary | ICD-10-CM | POA: Diagnosis not present

## 2016-11-27 DIAGNOSIS — Z79899 Other long term (current) drug therapy: Secondary | ICD-10-CM

## 2016-11-27 DIAGNOSIS — M869 Osteomyelitis, unspecified: Secondary | ICD-10-CM

## 2016-11-27 DIAGNOSIS — E785 Hyperlipidemia, unspecified: Secondary | ICD-10-CM | POA: Diagnosis present

## 2016-11-27 DIAGNOSIS — Z888 Allergy status to other drugs, medicaments and biological substances status: Secondary | ICD-10-CM

## 2016-11-27 DIAGNOSIS — Z7982 Long term (current) use of aspirin: Secondary | ICD-10-CM

## 2016-11-27 DIAGNOSIS — Z794 Long term (current) use of insulin: Secondary | ICD-10-CM

## 2016-11-27 DIAGNOSIS — E1169 Type 2 diabetes mellitus with other specified complication: Secondary | ICD-10-CM

## 2016-11-27 LAB — CBC WITH DIFFERENTIAL/PLATELET
Basophils Absolute: 0 10*3/uL (ref 0–0.1)
Basophils Relative: 0 %
Eosinophils Absolute: 0 10*3/uL (ref 0–0.7)
Eosinophils Relative: 1 %
HEMATOCRIT: 28.9 % — AB (ref 40.0–52.0)
HEMOGLOBIN: 9.8 g/dL — AB (ref 13.0–18.0)
LYMPHS ABS: 1 10*3/uL (ref 1.0–3.6)
LYMPHS PCT: 15 %
MCH: 26.5 pg (ref 26.0–34.0)
MCHC: 33.7 g/dL (ref 32.0–36.0)
MCV: 78.7 fL — ABNORMAL LOW (ref 80.0–100.0)
MONO ABS: 0.4 10*3/uL (ref 0.2–1.0)
MONOS PCT: 6 %
NEUTROS ABS: 5.2 10*3/uL (ref 1.4–6.5)
NEUTROS PCT: 78 %
Platelets: 402 10*3/uL (ref 150–440)
RBC: 3.67 MIL/uL — ABNORMAL LOW (ref 4.40–5.90)
RDW: 14.8 % — ABNORMAL HIGH (ref 11.5–14.5)
WBC: 6.6 10*3/uL (ref 3.8–10.6)

## 2016-11-27 LAB — GLUCOSE, CAPILLARY
GLUCOSE-CAPILLARY: 342 mg/dL — AB (ref 65–99)
GLUCOSE-CAPILLARY: 296 mg/dL — AB (ref 65–99)

## 2016-11-27 LAB — CREATININE, SERUM
Creatinine, Ser: 0.96 mg/dL (ref 0.61–1.24)
GFR calc Af Amer: >60 mL/min (ref >60)
GFR calc non Af Amer: >60 mL/min (ref >60)

## 2016-11-27 LAB — MRSA PCR SCREENING: MRSA by PCR: NEGATIVE

## 2016-11-27 LAB — CBC
HCT: 28.1 % — ABNORMAL LOW (ref 40.0–52.0)
HEMOGLOBIN: 9.2 g/dL — AB (ref 13.0–18.0)
MCH: 25.6 pg — ABNORMAL LOW (ref 26.0–34.0)
MCHC: 32.8 g/dL (ref 32.0–36.0)
MCV: 78.2 fL — ABNORMAL LOW (ref 80.0–100.0)
Platelets: 397 10*3/uL (ref 150–440)
RBC: 3.6 MIL/uL — AB (ref 4.40–5.90)
RDW: 15.3 % — ABNORMAL HIGH (ref 11.5–14.5)
WBC: 5.3 10*3/uL (ref 3.8–10.6)

## 2016-11-27 LAB — BASIC METABOLIC PANEL
ANION GAP: 8 (ref 5–15)
BUN: 17 mg/dL (ref 6–20)
CALCIUM: 9.2 mg/dL (ref 8.9–10.3)
CHLORIDE: 99 mmol/L — AB (ref 101–111)
CO2: 26 mmol/L (ref 22–32)
Creatinine, Ser: 0.98 mg/dL (ref 0.61–1.24)
GFR calc non Af Amer: >60 mL/min (ref >60)
GLUCOSE: 329 mg/dL — AB (ref 65–99)
Potassium: 5.1 mmol/L (ref 3.5–5.1)
Sodium: 133 mmol/L — ABNORMAL LOW (ref 135–145)

## 2016-11-27 MED ORDER — SODIUM CHLORIDE 0.9 % IV SOLN
500.0000 mg | Freq: Once | INTRAVENOUS | Status: DC
  Filled 2016-11-27: qty 500

## 2016-11-27 MED ORDER — LOSARTAN POTASSIUM 50 MG PO TABS
50.0000 mg | ORAL_TABLET | Freq: Every day | ORAL | Status: DC
  Administered 2016-11-27 – 2016-11-28 (×2): 50 mg via ORAL
  Filled 2016-11-27 (×2): qty 1

## 2016-11-27 MED ORDER — INSULIN ASPART 100 UNIT/ML ~~LOC~~ SOLN
0.0000 [IU] | Freq: Three times a day (TID) | SUBCUTANEOUS | Status: DC
Start: 2016-11-27 — End: 2016-11-28
  Administered 2016-11-27: 7 [IU] via SUBCUTANEOUS
  Administered 2016-11-28: 08:00:00 5 [IU] via SUBCUTANEOUS
  Filled 2016-11-27: qty 7
  Filled 2016-11-27: qty 5

## 2016-11-27 MED ORDER — VANCOMYCIN HCL IN DEXTROSE 1-5 GM/200ML-% IV SOLN
1000.0000 mg | Freq: Three times a day (TID) | INTRAVENOUS | Status: DC
Start: 2016-11-27 — End: 2016-11-28
  Administered 2016-11-27 – 2016-11-28 (×3): 1000 mg via INTRAVENOUS
  Filled 2016-11-27 (×5): qty 200

## 2016-11-27 MED ORDER — IBUPROFEN 400 MG PO TABS: 200.0000 mg | ORAL_TABLET | Freq: Four times a day (QID) | ORAL | Status: DC | PRN

## 2016-11-27 MED ORDER — ACETAMINOPHEN 500 MG PO TABS: 500.0000 mg | ORAL_TABLET | Freq: Four times a day (QID) | ORAL | Status: DC | PRN

## 2016-11-27 MED ORDER — TAMSULOSIN HCL 0.4 MG PO CAPS
0.4000 mg | ORAL_CAPSULE | Freq: Every day | ORAL | Status: DC
  Administered 2016-11-27 – 2016-11-28 (×2): 0.4 mg via ORAL
  Filled 2016-11-27 (×2): qty 1

## 2016-11-27 MED ORDER — DOCUSATE SODIUM 100 MG PO CAPS: 100.0000 mg | ORAL_CAPSULE | Freq: Two times a day (BID) | ORAL | Status: DC | PRN

## 2016-11-27 MED ORDER — ASPIRIN EC 81 MG PO TBEC
81.0000 mg | DELAYED_RELEASE_TABLET | Freq: Every day | ORAL | Status: DC
  Administered 2016-11-27 – 2016-11-28 (×2): 81 mg via ORAL
  Filled 2016-11-27 (×2): qty 1

## 2016-11-27 MED ORDER — PIPERACILLIN-TAZOBACTAM 3.375 G IVPB
3.3750 g | Freq: Three times a day (TID) | INTRAVENOUS | Status: DC
  Administered 2016-11-28 (×2): 3.375 g via INTRAVENOUS
  Filled 2016-11-27 (×4): qty 50

## 2016-11-27 MED ORDER — INSULIN ASPART 100 UNIT/ML ~~LOC~~ SOLN
10.0000 [IU] | Freq: Three times a day (TID) | SUBCUTANEOUS | Status: DC
  Administered 2016-11-27 – 2016-11-28 (×3): 10 [IU] via SUBCUTANEOUS
  Filled 2016-11-27 (×3): qty 10

## 2016-11-27 MED ORDER — ENOXAPARIN SODIUM 40 MG/0.4ML ~~LOC~~ SOLN
40.0000 mg | SUBCUTANEOUS | Status: DC
  Administered 2016-11-27: 40 mg via SUBCUTANEOUS
  Filled 2016-11-27: qty 0.4

## 2016-11-27 MED ORDER — PIPERACILLIN-TAZOBACTAM 3.375 G IVPB
3.3750 g | Freq: Three times a day (TID) | INTRAVENOUS | Status: DC
  Administered 2016-11-27: 18:00:00 3.375 g via INTRAVENOUS
  Filled 2016-11-27 (×4): qty 50

## 2016-11-27 MED ORDER — VANCOMYCIN HCL IN DEXTROSE 1-5 GM/200ML-% IV SOLN
1000.0000 mg | Freq: Once | INTRAVENOUS | Status: AC
  Administered 2016-11-27: 1000 mg via INTRAVENOUS
  Filled 2016-11-27: qty 200

## 2016-11-27 MED ORDER — PIPERACILLIN-TAZOBACTAM 3.375 G IVPB 30 MIN
3.3750 g | Freq: Once | INTRAVENOUS | Status: AC
  Administered 2016-11-27: 3.375 g via INTRAVENOUS
  Filled 2016-11-27: qty 50

## 2016-11-27 MED ORDER — INSULIN GLARGINE 100 UNIT/ML ~~LOC~~ SOLN
20.0000 [IU] | Freq: Every day | SUBCUTANEOUS | Status: DC
  Administered 2016-11-27 – 2016-11-28 (×2): 20 [IU] via SUBCUTANEOUS
  Filled 2016-11-27 (×2): qty 0.2

## 2016-11-27 MED ORDER — ATORVASTATIN CALCIUM 20 MG PO TABS
40.0000 mg | ORAL_TABLET | Freq: Every day | ORAL | Status: DC
  Administered 2016-11-27 – 2016-11-28 (×2): 40 mg via ORAL
  Filled 2016-11-27 (×2): qty 2

## 2016-11-27 MED ORDER — GABAPENTIN 600 MG PO TABS
1800.0000 mg | ORAL_TABLET | Freq: Three times a day (TID) | ORAL | Status: DC
  Administered 2016-11-27 – 2016-11-28 (×3): 1800 mg via ORAL
  Filled 2016-11-27 (×3): qty 3

## 2016-11-27 NOTE — ED Triage Notes (Signed)
Patient ambulatory to triage with steady gait, without difficulty or distress noted; pt reports currently being tx for cellulitis of right ankle; has been taking bactrim x 2wks but having increasing pain and was told by podiatrist to come to ED for IV antibiotics

## 2016-11-27 NOTE — H&P (Signed)
[facility] [place] [facility]   PATIENT NAME: Keith [name]    MR#:  [id number]  DATE OF BIRTH:  Dec 26, 1970  DATE OF ADMISSION:  11/27/2016  PRIMARY CARE PHYSICIAN: [facility] [place] [facility]   REQUESTING/REFERRING PHYSICIAN: Scotty Court  CHIEF COMPLAINT:   Chief Complaint  Patient presents with  . Ankle Pain    HISTORY OF PRESENT ILLNESS: Keith Torry  is a 46 y.o. male with a known history of DM, Htn- had pressure ulcer on bottom of first toe, and not taking his insulin regularly, so it got worse- went to podiatry clinic was given bactrim oral 2 weeks ago- which he is still taking- and started taking his insulin regularly since then- so blood sugar under control. Podiatry had advised, if redness spreads upwards or fever, need to go to ER, he had swelling spread to right ankle and also pain in ankle, so came here. Noted to have Osteomyelitis on right ankle on xray.  PAST MEDICAL HISTORY:   Past Medical History:  Diagnosis Date  . Diabetes mellitus without complication (HCC)   . Hyperlipidemia   . Hypertension   . Neuropathy     PAST SURGICAL HISTORY: Past Surgical History:  Procedure Laterality Date  . IRRIGATION AND DEBRIDEMENT FOOT Left 04/02/2016   Procedure: IRRIGATION AND DEBRIDEMENT FOOT;  Surgeon: Gwyneth Revels, DPM;  Location: [facility] ORS;  Service: Podiatry;  Laterality: Left;    SOCIAL HISTORY:  Social History  Substance Use Topics  . Smoking status: Never Smoker  . Smokeless tobacco: Never Used  . Alcohol use No    FAMILY HISTORY:  Family History  Problem Relation Age of Onset  . Prostate cancer Father   . Bladder Cancer Neg Hx   . Kidney cancer Neg Hx     DRUG ALLERGIES:  Allergies  Allergen Reactions  . Daptomycin Shortness Of Breath    Breathing issues  . Lisinopril     Cough    REVIEW OF SYSTEMS:   CONSTITUTIONAL: No fever, fatigue or weakness.  EYES: No blurred or double vision.  EARS, NOSE, AND THROAT: No tinnitus or ear pain.   RESPIRATORY: No cough, shortness of breath, wheezing or hemoptysis.  CARDIOVASCULAR: No chest pain, orthopnea, edema.  GASTROINTESTINAL: No nausea, vomiting, diarrhea or abdominal pain.  GENITOURINARY: No dysuria, hematuria.  ENDOCRINE: No polyuria, nocturia,  HEMATOLOGY: No anemia, easy bruising or bleeding SKIN: No rash or lesion. MUSCULOSKELETAL: right ankle swelling and pain.   NEUROLOGIC: No tingling, numbness, weakness.  PSYCHIATRY: No anxiety or depression.   MEDICATIONS AT HOME:  Prior to Admission medications   Medication Sig Start Date End Date Taking? Authorizing Provider  aspirin 81 MG tablet Take 81 mg by mouth daily.   Yes [provider]  atorvastatin (LIPITOR) 40 MG tablet Take 40 mg by mouth daily.   Yes [provider]  gabapentin (NEURONTIN) 600 MG tablet Take 1,800 mg by mouth 3 (three) times daily.    Yes [provider]  polyethylene glycol (MIRALAX / GLYCOLAX) packet Take 17 g by mouth daily. Mix one tablespoon with 8oz of your favorite juice or water every day until you are having soft formed stools. Then start taking once daily if you didn't have a stool the day before. 11/06/16  Yes Willy Eddy, MD  tamsulosin (FLOMAX) 0.4 MG CAPS capsule Take 1 capsule (0.4 mg total) by mouth daily. 11/04/16  Yes McGowan, Carollee Herter A, PA-C  acetaminophen (TYLENOL) 500 MG tablet Take 500 mg by mouth every 6 (  six) hours as needed.    [provider]  ibuprofen (ADVIL,MOTRIN) 200 MG tablet Take 200 mg by mouth every 6 (six) hours as needed.    [provider]  insulin aspart (NOVOLOG) 100 UNIT/ML injection Inject 10 Units into the skin 3 (three) times daily before meals. Per sliding scale 04/04/16   Alford HighlandWieting, Richard, MD  insulin glargine (LANTUS) 100 UNIT/ML injection 20 units subcutaneous injection in am 80 units subcutaneous injection at bedtime 04/04/16   Alford HighlandWieting, Richard, MD  losartan (COZAAR) 50 MG tablet Take 50 mg by mouth daily.     [provider]  phenazopyridine (PYRIDIUM) 95 MG tablet Take 95 mg by mouth 3 (three) times daily as needed for pain.    [provider]      PHYSICAL EXAMINATION:   VITAL SIGNS: Blood pressure 138/83, pulse 89, temperature 98.7 F (37.1 C), temperature source Oral, resp. rate 18, height 6' (1.829 m), weight 91.2 kg (201 lb), SpO2 100 %.  GENERAL:  46 y.o.-year-old patient lying in the bed with no acute distress.  EYES: Pupils equal, round, reactive to light and accommodation. No scleral icterus. Extraocular muscles intact.  HEENT: Head atraumatic, normocephalic. Oropharynx and nasopharynx clear.  NECK:  Supple, no jugular venous distention. No thyroid enlargement, no tenderness.  LUNGS: Normal breath sounds bilaterally, no wheezing, rales,rhonchi or crepitation. No use of accessory muscles of respiration.  CARDIOVASCULAR: S1, S2 normal. No murmurs, rubs, or gallops.  ABDOMEN: Soft, nontender, nondistended. Bowel sounds present. No organomegaly or mass.  EXTREMITIES: No pedal edema, cyanosis, or clubbing. Right ankle is tender, swollen, no redness, pulse on right foot are palpable. NEUROLOGIC: Cranial nerves II through XII are intact. Muscle strength 5/5 in all extremities. Sensation intact. Gait not checked.  PSYCHIATRIC: The patient is alert and oriented x 3.  SKIN: No obvious rash, lesion, or ulcer.   LABORATORY PANEL:   CBC  Recent Labs Lab 11/27/16 0721  WBC 6.6  HGB 9.8*  HCT 28.9*  PLT 402  MCV 78.7*  MCH 26.5  MCHC 33.7  RDW 14.8*  LYMPHSABS 1.0  MONOABS 0.4  EOSABS 0.0  BASOSABS 0.0   ------------------------------------------------------------------------------------------------------------------  Chemistries   Recent Labs Lab 11/27/16 0721  NA 133*  K 5.1  CL 99*  CO2 26  GLUCOSE 329*  BUN 17  CREATININE 0.98  CALCIUM 9.2    ------------------------------------------------------------------------------------------------------------------ estimated creatinine clearance is 103.4 mL/min (by C-G formula based on SCr of 0.98 mg/dL). ------------------------------------------------------------------------------------------------------------------ No results for input(s): TSH, T4TOTAL, T3FREE, THYROIDAB in the last 72 hours.  Invalid input(s): FREET3   Coagulation profile No results for input(s): INR, PROTIME in the last 168 hours. ------------------------------------------------------------------------------------------------------------------- No results for input(s): DDIMER in the last 72 hours. -------------------------------------------------------------------------------------------------------------------  Cardiac Enzymes No results for input(s): CKMB, TROPONINI, MYOGLOBIN in the last 168 hours.  Invalid input(s): CK ------------------------------------------------------------------------------------------------------------------ Invalid input(s): POCBNP  ---------------------------------------------------------------------------------------------------------------  Urinalysis    Component Value Date/Time   COLORURINE GREEN (A) 11/06/2016 1758   APPEARANCEUR CLOUDY (A) 11/06/2016 1758   LABSPEC 1.018 11/06/2016 1758   PHURINE 5.0 11/06/2016 1758   GLUCOSEU NEGATIVE 11/06/2016 1758   HGBUR SMALL (A) 11/06/2016 1758   BILIRUBINUR NEGATIVE 11/06/2016 1758   KETONESUR NEGATIVE 11/06/2016 1758   PROTEINUR 100 (A) 11/06/2016 1758   NITRITE NEGATIVE 11/06/2016 1758   LEUKOCYTESUR LARGE (A) 11/06/2016 1758     RADIOLOGY: Dg Ankle Complete Right  Result Date: 11/27/2016 CLINICAL DATA:  Right foot and ankle swelling.  Diabetes. EXAM: RIGHT ANKLE - COMPLETE  3+ VIEW COMPARISON:  No recent prior. FINDINGS: Diffuse soft tissue swelling. Diffuse degenerative change. Corticated bony density noted adjacent  to the lateral malleolus, most likely an old fracture fragment. No evidence fracture or dislocation. IMPRESSION: Soft tissue swelling. Diffuse degenerative change. Old fracture fragment lateral malleolus. No acute bony abnormality identified. Electronically Signed   By: Maisie Fus  Register   On: 11/27/2016 08:14   Mr Foot Right Wo Contrast  Result Date: 11/27/2016 CLINICAL DATA:  Patient ambulatory to triage with steady gait, without difficulty or distress noted. Pt reports currently being tx for cellulitis of right ankle. Has been taking bactrim x 2wks but having increasing pain. EXAM: MRI OF THE RIGHT FOREFOOT WITHOUT CONTRAST TECHNIQUE: Multiplanar, multisequence MR imaging of the right foot was performed. No intravenous contrast was administered. COMPARISON:  None. FINDINGS: TENDONS Peroneal: Peroneal longus tendon intact. Peroneal brevis intact. Posteromedial: Posterior tibial tendon intact. Flexor hallucis longus tendon intact. Flexor digitorum longus tendon intact. Anterior: Tibialis anterior tendon intact. Extensor hallucis longus tendon intact Extensor digitorum longus tendon intact. Achilles:  Intact. Plantar Fascia: Intact. LIGAMENTS Lateral: Anterior talofibular ligament intact. Posterior talofibular ligament intact. Anterior and posterior tibiofibular ligaments intact. Medial: Deltoid ligament intact. Spring ligament intact. CARTILAGE Ankle Joint: No joint effusion. Normal ankle mortise. No chondral defect. Subtalar Joints/Sinus Tarsi: Normal subtalar joints. No subtalar joint effusion. Normal sinus tarsi. Bones: Mild osteoarthritis of the first MTP joint. Severe marrow edema and areas of serpiginous T1 low signal involving the anterior talus and the navicular with a small talonavicular joint effusion. Mild marrow edema in the middle facet of the calcaneus. Mild nonspecific T2 hyperintense signal in the distal fibula and tibia. Soft Tissue: No fluid collection or hematoma. Soft tissue edema throughout  the plantar musculature likely neurogenic given the patient's history of diabetes. IMPRESSION: 1. Severe marrow edema and areas of serpiginous T1 low signal involving the anterior talus and the navicular with a small talonavicular joint effusion. The appearance is nonspecific. Differential considerations include avascular necrosis versus stress related edema versus infection. 2. Mild marrow edema in the middle facet of the calcaneus which may reactive given the adjacent changes. Electronically Signed   By: Elige Ko   On: 11/27/2016 13:03   Dg Foot Complete Right  Result Date: 11/27/2016 CLINICAL DATA:  Cellulitis.  Diabetes.  Flow ulcers. EXAM: RIGHT FOOT COMPLETE - 3+ VIEW COMPARISON:  No prior. FINDINGS: Diffuse soft tissue swelling. No radiopaque foreign body. Peripheral vascular calcification. Diffuse degenerative change. No acute or focal bony abnormality. IMPRESSION: 1. Diffuse soft tissue swelling.  Peripheral vascular disease. 2. Diffuse degenerative change.  No acute or focal bony abnormality. Electronically Signed   By: Maisie Fus  Register   On: 11/27/2016 08:13    EKG: No orders found for this or any previous visit.  IMPRESSION AND PLAN:  * Right ankle osteomyelitis   IV vanc + zosyn   Check MRSA screen.   Get ID consult.  * DM   Cont home insuline, keep on ISS.  * Htn   Cont home meds  * Hyperlipidemia   Cont statin  All the records are reviewed and case discussed with ED provider. Management plans discussed with the patient, family and they are in agreement.  CODE STATUS: Full. Code Status History    Date Active Date Inactive Code Status Order ID Comments User Context   04/01/2016 12:49 AM 04/04/2016  2:41 PM Full Code [id number]  Hugelmeyer, Jon Gills, DO ED       TOTAL TIME TAKING CARE  OF THIS PATIENT: 50 minutes.    Altamese Dilling M.D on 11/27/2016   Between 7am to 6pm - Pager - [phone]  After 6pm go to [email or web address] - password [email or web address]  [facility]  Office  [phone]  CC: Primary care physician; [facility] [place] [facility]   Note: This dictation was prepared with Dragon dictation along with smaller phrase technology. Any transcriptional errors that result from this process are unintentional.

## 2016-11-27 NOTE — Progress Notes (Signed)
Pharmacy Antibiotic Note  Keith [name] is a 46 y.o. male admitted on 11/27/2016 with  Osteomyelitis .  Pharmacy has been consulted for Vancomycin and Zosyn dosing. Patient received vancomycin 1g IV and Zosyn 3.375 IV x 1 dose in ED.   Plan: Ke: 0.100  VD: 63.8    T1/2: 7.7     Will start the patient on Vancomycin 1g IV every 8 hours. Calculated trough at Css is 15.4. Will order trough level prior to 5th dose. Will monitor renal function and adjust dose as needed.   Will start patient on Zosyn 3.375 IV EI every 8 hours.   Height: 6' (182.9 cm) Weight: 201 lb (91.2 kg) IBW/kg (Calculated) : 77.6  Temp (24hrs), Avg:98.7 F (37.1 C), Min:98.7 F (37.1 C), Max:98.7 F (37.1 C)   Recent Labs Lab 11/27/16 0721  WBC 6.6  CREATININE 0.98    Estimated Creatinine Clearance: 103.4 mL/min (by C-G formula based on SCr of 0.98 mg/dL).    Allergies  Allergen Reactions  . Daptomycin Shortness Of Breath    Breathing issues  . Lisinopril     Cough    Antimicrobials this admission: 5/17 Zosyn >>  5/17 vancomycin >>   Dose adjustments this admission:  Microbiology results: 5/17  MRSA PCR: pending  Thank you for allowing pharmacy to be a part of this patient's care.  Gardner CandleSheema M Nairi Oswald, PharmD, BCPS Clinical Pharmacist 11/27/2016 3:59 PM

## 2016-11-27 NOTE — ED Provider Notes (Signed)
[facility] Emergency Department Provider Note  ____________________________________________  Time seen: Approximately 7:17 AM  I have reviewed the triage vital signs and the nursing notes.   HISTORY  Chief Complaint Ankle Pain    HPI Keith [name] is a 46 y.o. male who complains of worsening pain and swelling of the right foot and right ankle over the past 2 weeks. He has a history of diabetic ulcer, debrided in clinic on 11/14/2016 and started on Bactrim. He completed the course but despite healing of bilateral plantar wounds, he still has the persistent pain and swelling of the right ankle. It's worse with weightbearing. No alleviating factors, aching, moderate to severe in intensity. Radiates up the lower leg.  Podiatry assessment by Dr. Gala Lewandowsky 11-14-16: #1 Sub 1st MPJ ulcer to bilateral feet secondary to diabetes mellitus #2 diabetes mellitus w/ peripheral neuropathy #3 right ankle cellulitis   Patient denies history of DVT or PE. No recent travel, hospitalization or surgery. No chest pain shortness of breath or fever  Past Medical History:  Diagnosis Date  . Diabetes mellitus without complication (HCC)   . Hyperlipidemia   . Hypertension   . Neuropathy      Patient Active Problem List   Diagnosis Date Noted  . Diabetic foot ulcer (HCC) 03/31/2016     Past Surgical History:  Procedure Laterality Date  . IRRIGATION AND DEBRIDEMENT FOOT Left 04/02/2016   Procedure: IRRIGATION AND DEBRIDEMENT FOOT;  Surgeon: Gwyneth Revels, DPM;  Location: [facility] ORS;  Service: Podiatry;  Laterality: Left;     Prior to Admission medications   Medication Sig Start Date End Date Taking? Authorizing Provider  aspirin 81 MG tablet Take 81 mg by mouth daily.   Yes [provider]  atorvastatin (LIPITOR) 40 MG tablet Take 40 mg by mouth daily.   Yes [provider]  gabapentin (NEURONTIN) 600 MG tablet Take 1,800 mg by mouth 3 (three) times daily.     Yes [provider]  polyethylene glycol (MIRALAX / GLYCOLAX) packet Take 17 g by mouth daily. Mix one tablespoon with 8oz of your favorite juice or water every day until you are having soft formed stools. Then start taking once daily if you didn't have a stool the day before. 11/06/16  Yes Willy Eddy, MD  sulfamethoxazole-trimethoprim (BACTRIM) 400-80 MG tablet Take 1 tablet by mouth 2 (two) times daily. 11/24/16  Yes Felecia Shelling, DPM  tamsulosin (FLOMAX) 0.4 MG CAPS capsule Take 1 capsule (0.4 mg total) by mouth daily. 11/04/16  Yes McGowan, Carollee Herter A, PA-C  acetaminophen (TYLENOL) 500 MG tablet Take 500 mg by mouth every 6 (six) hours as needed.    [provider]  ibuprofen (ADVIL,MOTRIN) 200 MG tablet Take 200 mg by mouth every 6 (six) hours as needed.    [provider]  insulin aspart (NOVOLOG) 100 UNIT/ML injection Inject 10 Units into the skin 3 (three) times daily before meals. Per sliding scale 04/04/16   Alford Highland, MD  insulin glargine (LANTUS) 100 UNIT/ML injection 20 units subcutaneous injection in am 80 units subcutaneous injection at bedtime 04/04/16   Alford Highland, MD  lactulose (CHRONULAC) 10 GM/15ML solution Take 30 mLs (20 g total) by mouth daily as needed for mild constipation. Patient not taking: Reported on 11/03/2016 10/24/16   Irean Hong, MD  losartan (COZAAR) 50 MG tablet Take 50 mg by mouth daily.    [provider]  metFORMIN (GLUCOPHAGE) 500 MG tablet Take 1 tablet (500  mg total) by mouth 2 (two) times daily with a meal. Patient not taking: Reported on 11/03/2016 10/15/16 10/15/17  Willy Eddyobinson, Patrick, MD  phenazopyridine (PYRIDIUM) 95 MG tablet Take 95 mg by mouth 3 (three) times daily as needed for pain.    [provider]     Allergies Daptomycin and Lisinopril   Family History  Problem Relation Age of Onset  . Prostate cancer Father   . Bladder Cancer Neg Hx   . Kidney cancer Neg Hx     Social  History Social History  Substance Use Topics  . Smoking status: Never Smoker  . Smokeless tobacco: Never Used  . Alcohol use No    Review of Systems  Constitutional:   No fever or chills.  ENT:   No sore throat. No rhinorrhea. Lymphatic: No swollen glands, No extremity swelling Endocrine: No hot/cold flashes. No significant weight change. No neck swelling. Cardiovascular:   No chest pain or syncope. Respiratory:   No dyspnea or cough. Gastrointestinal:   Negative for abdominal pain, vomiting and diarrhea.  Genitourinary:   Negative for dysuria or difficulty urinating. Musculoskeletal:   Negative for focal pain or swelling Neurological:   Negative for headaches or weakness. All other systems reviewed and are negative except as documented above in ROS and HPI.  ____________________________________________   PHYSICAL EXAM:  VITAL SIGNS: ED Triage Vitals [11/27/16 0649]  Enc Vitals Group     BP      Pulse      Resp      Temp      Temp src      SpO2      Weight 201 lb (91.2 kg)     Height 6' (1.829 m)     Head Circumference      Peak Flow      Pain Score 10     Pain Loc      Pain Edu?      Excl. in GC?     Vital signs reviewed, nursing assessments reviewed.   Constitutional:   Alert and oriented. Well appearing and in no distress. Eyes:   No scleral icterus. No conjunctival pallor. PERRL. EOMI.  No nystagmus. ENT   Head:   Normocephalic and atraumatic.   Nose:   No congestion/rhinnorhea.    Mouth/Throat:   MMM, no pharyngeal erythema. No peritonsillar mass.    Neck:   No stridor. No meningismus. Hematological/Lymphatic/Immunilogical:   No cervical lymphadenopathy. Cardiovascular:   RRR. Symmetric bilateral radial and DP pulses.  No murmurs.  Respiratory:   Normal respiratory effort without tachypnea nor retractions. Breath sounds are clear and equal bilaterally. No wheezes/rales/rhonchi. Gastrointestinal:   Soft and nontender. Non distended. There is  no CVA tenderness.  No rebound, rigidity, or guarding. Genitourinary:   deferred Musculoskeletal:   Normal range of motion in all extremities.Diffusely swollen right ankle and foot with slight erythema. No lymphangitis fluctuance induration or crepitus. Minimal right ankle effusion without tenderness at the joint line. There is tenderness diffusely in the right foot and ankle in the area of swelling.  Neurologic:   Normal speech and language.   Motor grossly intact. No gross focal neurologic deficits are appreciated.  Skin:    Skin is warm, dry with appropriately healing plantar wounds bilateral first MTP joints. No rash noted.  No petechiae, purpura, or bullae.  ____________________________________________    LABS (pertinent positives/negatives) (all labs ordered are listed, but only abnormal results are displayed) Labs Reviewed  BASIC METABOLIC PANEL - Abnormal;  Notable for the following:       Result Value   Sodium 133 (*)    Chloride 99 (*)    Glucose, Bld 329 (*)    All other components within normal limits  CBC WITH DIFFERENTIAL/PLATELET - Abnormal; Notable for the following:    RBC 3.67 (*)    Hemoglobin 9.8 (*)    HCT 28.9 (*)    MCV 78.7 (*)    RDW 14.8 (*)    All other components within normal limits   ____________________________________________   EKG    ____________________________________________    RADIOLOGY  Dg Ankle Complete Right  Result Date: 11/27/2016 CLINICAL DATA:  Right foot and ankle swelling.  Diabetes. EXAM: RIGHT ANKLE - COMPLETE 3+ VIEW COMPARISON:  No recent prior. FINDINGS: Diffuse soft tissue swelling. Diffuse degenerative change. Corticated bony density noted adjacent to the lateral malleolus, most likely an old fracture fragment. No evidence fracture or dislocation. IMPRESSION: Soft tissue swelling. Diffuse degenerative change. Old fracture fragment lateral malleolus. No acute bony abnormality identified. Electronically Signed   By: Maisie Fus   Register   On: 11/27/2016 08:14   Mr Foot Right Wo Contrast  Result Date: 11/27/2016 CLINICAL DATA:  Patient ambulatory to triage with steady gait, without difficulty or distress noted. Pt reports currently being tx for cellulitis of right ankle. Has been taking bactrim x 2wks but having increasing pain. EXAM: MRI OF THE RIGHT FOREFOOT WITHOUT CONTRAST TECHNIQUE: Multiplanar, multisequence MR imaging of the right foot was performed. No intravenous contrast was administered. COMPARISON:  None. FINDINGS: TENDONS Peroneal: Peroneal longus tendon intact. Peroneal brevis intact. Posteromedial: Posterior tibial tendon intact. Flexor hallucis longus tendon intact. Flexor digitorum longus tendon intact. Anterior: Tibialis anterior tendon intact. Extensor hallucis longus tendon intact Extensor digitorum longus tendon intact. Achilles:  Intact. Plantar Fascia: Intact. LIGAMENTS Lateral: Anterior talofibular ligament intact. Posterior talofibular ligament intact. Anterior and posterior tibiofibular ligaments intact. Medial: Deltoid ligament intact. Spring ligament intact. CARTILAGE Ankle Joint: No joint effusion. Normal ankle mortise. No chondral defect. Subtalar Joints/Sinus Tarsi: Normal subtalar joints. No subtalar joint effusion. Normal sinus tarsi. Bones: Mild osteoarthritis of the first MTP joint. Severe marrow edema and areas of serpiginous T1 low signal involving the anterior talus and the navicular with a small talonavicular joint effusion. Mild marrow edema in the middle facet of the calcaneus. Mild nonspecific T2 hyperintense signal in the distal fibula and tibia. Soft Tissue: No fluid collection or hematoma. Soft tissue edema throughout the plantar musculature likely neurogenic given the patient's history of diabetes. IMPRESSION: 1. Severe marrow edema and areas of serpiginous T1 low signal involving the anterior talus and the navicular with a small talonavicular joint effusion. The appearance is nonspecific.  Differential considerations include avascular necrosis versus stress related edema versus infection. 2. Mild marrow edema in the middle facet of the calcaneus which may reactive given the adjacent changes. Electronically Signed   By: Elige Ko   On: 11/27/2016 13:03   Dg Foot Complete Right  Result Date: 11/27/2016 CLINICAL DATA:  Cellulitis.  Diabetes.  Flow ulcers. EXAM: RIGHT FOOT COMPLETE - 3+ VIEW COMPARISON:  No prior. FINDINGS: Diffuse soft tissue swelling. No radiopaque foreign body. Peripheral vascular calcification. Diffuse degenerative change. No acute or focal bony abnormality. IMPRESSION: 1. Diffuse soft tissue swelling.  Peripheral vascular disease. 2. Diffuse degenerative change.  No acute or focal bony abnormality. Electronically Signed   By: Maisie Fus  Register   On: 11/27/2016 08:13    ____________________________________________   PROCEDURES Procedures  ____________________________________________  INITIAL IMPRESSION / ASSESSMENT AND PLAN / ED COURSE  Pertinent labs & imaging results that were available during my care of the patient were reviewed by me and considered in my medical decision making (see chart for details).   Clinical Course as of Nov 28 1339  Thu Nov 27, 2016  7829 Patient noted distress. Concern for cellulitis failing outpatient treatment versus osteomyelitis of the right foot or ankle. Low suspicion for septic arthritis with minimal joint effusion. Presentation not consistent with DVT. Check labs and x-rays, likely requires hospitalization for IV vancomycin and Zosyn.  [PS]  0842 D/w Pod. Dr. Alberteen Spindle. Agrees presentation not c/w osteo with afebrile, wbc 6, but agrees pt will need MRI at some point. With patients vascular and immunologic dysfunction related to sequelae of DM, I will obtain MRI today  [PS]    Clinical Course User Index [PS] Sharman Cheek, MD    ----------------------------------------- 1:40 PM on  11/27/2016 -----------------------------------------  MRI reveals bone marrow edema in the navicular and talus consistent with osteomyelitis. Patient is not septic, but with his poorly controlled diabetes, warrants continued IV antibiotics to ensure improvement. Case discussed with hospitalist for admission.   ____________________________________________   FINAL CLINICAL IMPRESSION(S) / ED DIAGNOSES  Final diagnoses:  Right ankle pain  Right foot pain  Osteomyelitis of right foot, unspecified type [facility])      New Prescriptions   No medications on file     Portions of this note were generated with dragon dictation software. Dictation errors may occur despite best attempts at proofreading.    Sharman Cheek, MD 11/27/16 1341

## 2016-11-27 NOTE — ED Notes (Signed)
Pt given sandwich tray at this time  

## 2016-11-27 NOTE — ED Notes (Signed)
Patient transported to MRI by ER Tech.

## 2016-11-28 LAB — BASIC METABOLIC PANEL
Anion gap: 8 (ref 5–15)
BUN: 26 mg/dL — ABNORMAL HIGH (ref 6–20)
CALCIUM: 9.1 mg/dL (ref 8.9–10.3)
CO2: 27 mmol/L (ref 22–32)
Chloride: 98 mmol/L — ABNORMAL LOW (ref 101–111)
Creatinine, Ser: 1.47 mg/dL — ABNORMAL HIGH (ref 0.61–1.24)
GFR calc Af Amer: >60 mL/min (ref >60)
GFR, EST NON AFRICAN AMERICAN: 56 mL/min — AB (ref >60)
Glucose, Bld: 310 mg/dL — ABNORMAL HIGH (ref 65–99)
POTASSIUM: 5 mmol/L (ref 3.5–5.1)
Sodium: 133 mmol/L — ABNORMAL LOW (ref 135–145)

## 2016-11-28 LAB — CBC
HCT: 26.5 % — ABNORMAL LOW (ref 40.0–52.0)
HEMOGLOBIN: 8.7 g/dL — AB (ref 13.0–18.0)
MCH: 25.5 pg — ABNORMAL LOW (ref 26.0–34.0)
MCHC: 32.7 g/dL (ref 32.0–36.0)
MCV: 78 fL — ABNORMAL LOW (ref 80.0–100.0)
Platelets: 379 10*3/uL (ref 150–440)
RBC: 3.4 MIL/uL — AB (ref 4.40–5.90)
RDW: 14.8 % — ABNORMAL HIGH (ref 11.5–14.5)
WBC: 6.6 10*3/uL (ref 3.8–10.6)

## 2016-11-28 LAB — GLUCOSE, CAPILLARY
Glucose-Capillary: 280 mg/dL — ABNORMAL HIGH (ref 65–99)
Glucose-Capillary: 244 mg/dL — ABNORMAL HIGH (ref 65–99)
Glucose-Capillary: 315 mg/dL — ABNORMAL HIGH (ref 65–99)

## 2016-11-28 LAB — HIV ANTIBODY (ROUTINE TESTING W REFLEX): HIV SCREEN 4TH GENERATION: NONREACTIVE

## 2016-11-28 MED ORDER — INSULIN GLARGINE 100 UNIT/ML ~~LOC~~ SOLN: SUBCUTANEOUS | 0 refills | Status: DC

## 2016-11-28 MED ORDER — INSULIN GLARGINE 100 UNIT/ML ~~LOC~~ SOLN
80.0000 [IU] | Freq: Every day | SUBCUTANEOUS | Status: DC
  Filled 2016-11-28: qty 0.8

## 2016-11-28 MED ORDER — OXYCODONE HCL 10 MG PO TABS
10.0000 mg | ORAL_TABLET | Freq: Four times a day (QID) | ORAL | 0 refills | Status: DC | PRN
Start: 2016-11-28 — End: 2016-12-19

## 2016-11-28 MED ORDER — IBUPROFEN 400 MG PO TABS
400.0000 mg | ORAL_TABLET | Freq: Four times a day (QID) | ORAL | Status: DC | PRN
Start: 2016-11-28 — End: 2016-11-28

## 2016-11-28 MED ORDER — OXYCODONE HCL 5 MG PO TABS: 10.0000 mg | ORAL_TABLET | Freq: Four times a day (QID) | ORAL | Status: DC | PRN

## 2016-11-28 MED ORDER — INSULIN ASPART 100 UNIT/ML ~~LOC~~ SOLN: 10.0000 [IU] | Freq: Three times a day (TID) | SUBCUTANEOUS | 1 refills | Status: AC

## 2016-11-28 MED ORDER — INSULIN ASPART 100 UNIT/ML ~~LOC~~ SOLN
0.0000 [IU] | Freq: Three times a day (TID) | SUBCUTANEOUS | Status: DC
  Administered 2016-11-28: 12:00:00 5 [IU] via SUBCUTANEOUS
  Filled 2016-11-28: qty 5

## 2016-11-28 NOTE — Consult Note (Signed)
Reason for Consult: Evaluation for possible osteomyelitis right foot. Referring Physician: Sona Patel  Mali E Keith [name] is an 46 y.o. male.  HPI: This is a 46 year old male who relates a history of having ulcerations beneath his forefoot a couple of weeks ago. States he was treated outpatient by a local podiatry group and placed on antibiotics. Had some redness around the rear foot. Was told that if this got worse that he needed to go to the emergency department for IV antibiotics. States the redness has not really increased but he has had some increased pain in the right foot and leg. Presented to the emergency department yesterday where an MRI was performed and he was diagnosed with the possibility of avascular necrosis, early Charcot changes, or osteomyelitis.  Past Medical History:  Diagnosis Date  . Diabetes mellitus without complication ([place])   . Hyperlipidemia   . Hypertension   . Neuropathy     Past Surgical History:  Procedure Laterality Date  . IRRIGATION AND DEBRIDEMENT FOOT Left 04/02/2016   Procedure: IRRIGATION AND DEBRIDEMENT FOOT;  Surgeon: Samara Deist, DPM;  Location: [facility] ORS;  Service: Podiatry;  Laterality: Left;    Family History  Problem Relation Age of Onset  . Prostate cancer Father   . Bladder Cancer Neg Hx   . Kidney cancer Neg Hx     Social History:  reports that he has never smoked. He has never used smokeless tobacco. He reports that he does not drink alcohol or use drugs.  Allergies:  Allergies  Allergen Reactions  . Daptomycin Shortness Of Breath    Breathing issues  . Lisinopril     Cough    Medications:  Scheduled: . aspirin EC  81 mg Oral Daily  . atorvastatin  40 mg Oral Daily  . enoxaparin (LOVENOX) injection  40 mg Subcutaneous Q24H  . gabapentin  1,800 mg Oral TID  . insulin aspart  0-15 Units Subcutaneous TID WC  . insulin aspart  10 Units Subcutaneous TID AC  . insulin glargine  80 Units Subcutaneous QHS  . tamsulosin  0.4 mg Oral  Daily    Results for orders placed or performed during the hospital encounter of 11/27/16 (from the past 48 hour(s))  Basic metabolic panel     Status: Abnormal   Collection Time: 11/27/16  7:21 AM  Result Value Ref Range   Sodium 133 (L) 135 - 145 mmol/L   Potassium 5.1 3.5 - 5.1 mmol/L   Chloride 99 (L) 101 - 111 mmol/L   CO2 26 22 - 32 mmol/L   Glucose, Bld 329 (H) 65 - 99 mg/dL   BUN 17 6 - 20 mg/dL   Creatinine, Ser 0.98 0.61 - 1.24 mg/dL   Calcium 9.2 8.9 - 10.3 mg/dL   GFR calc non Af Amer >60 >60 mL/min   GFR calc Af Amer >60 >60 mL/min    Comment: (NOTE) The eGFR has been calculated using the CKD EPI equation. This calculation has not been validated in all clinical situations. eGFR's persistently <60 mL/min signify possible Chronic Kidney Disease.    Anion gap 8 5 - 15  CBC with Differential     Status: Abnormal   Collection Time: 11/27/16  7:21 AM  Result Value Ref Range   WBC 6.6 3.8 - 10.6 K/uL   RBC 3.67 (L) 4.40 - 5.90 MIL/uL   Hemoglobin 9.8 (L) 13.0 - 18.0 g/dL   HCT 28.9 (L) 40.0 - 52.0 %   MCV 78.7 (L) 80.0 -  100.0 fL   MCH 26.5 26.0 - 34.0 pg   MCHC 33.7 32.0 - 36.0 g/dL   RDW 14.8 (H) 11.5 - 14.5 %   Platelets 402 150 - 440 K/uL   Neutrophils Relative % 78 %   Neutro Abs 5.2 1.4 - 6.5 K/uL   Lymphocytes Relative 15 %   Lymphs Abs 1.0 1.0 - 3.6 K/uL   Monocytes Relative 6 %   Monocytes Absolute 0.4 0.2 - 1.0 K/uL   Eosinophils Relative 1 %   Eosinophils Absolute 0.0 0 - 0.7 K/uL   Basophils Relative 0 %   Basophils Absolute 0.0 0 - 0.1 K/uL  HIV antibody (Routine Testing)     Status: None   Collection Time: 11/27/16  4:29 PM  Result Value Ref Range   HIV Screen 4th Generation wRfx Non Reactive Non Reactive    Comment: (NOTE) Performed At: [facility] [place], [place] [id number] Lindon Romp MD WU:[id number]   CBC     Status: Abnormal   Collection Time: 11/27/16  4:29 PM  Result Value Ref Range   WBC 5.3 3.8 -  10.6 K/uL   RBC 3.60 (L) 4.40 - 5.90 MIL/uL   Hemoglobin 9.2 (L) 13.0 - 18.0 g/dL   HCT 28.1 (L) 40.0 - 52.0 %   MCV 78.2 (L) 80.0 - 100.0 fL   MCH 25.6 (L) 26.0 - 34.0 pg   MCHC 32.8 32.0 - 36.0 g/dL   RDW 15.3 (H) 11.5 - 14.5 %   Platelets 397 150 - 440 K/uL  Creatinine, serum     Status: None   Collection Time: 11/27/16  4:29 PM  Result Value Ref Range   Creatinine, Ser 0.96 0.61 - 1.24 mg/dL   GFR calc non Af Amer >60 >60 mL/min   GFR calc Af Amer >60 >60 mL/min    Comment: (NOTE) The eGFR has been calculated using the CKD EPI equation. This calculation has not been validated in all clinical situations. eGFR's persistently <60 mL/min signify possible Chronic Kidney Disease.   MRSA PCR Screening     Status: None   Collection Time: 11/27/16  4:51 PM  Result Value Ref Range   MRSA by PCR NEGATIVE NEGATIVE    Comment:        The GeneXpert MRSA Assay (FDA approved for NASAL specimens only), is one component of a comprehensive MRSA colonization surveillance program. It is not intended to diagnose MRSA infection nor to guide or monitor treatment for MRSA infections.   Glucose, capillary     Status: Abnormal   Collection Time: 11/27/16  5:43 PM  Result Value Ref Range   Glucose-Capillary 342 (H) 65 - 99 mg/dL  Glucose, capillary     Status: Abnormal   Collection Time: 11/27/16  7:39 PM  Result Value Ref Range   Glucose-Capillary 296 (H) 65 - 99 mg/dL  Basic metabolic panel     Status: Abnormal   Collection Time: 11/28/16  5:15 AM  Result Value Ref Range   Sodium 133 (L) 135 - 145 mmol/L   Potassium 5.0 3.5 - 5.1 mmol/L   Chloride 98 (L) 101 - 111 mmol/L   CO2 27 22 - 32 mmol/L   Glucose, Bld 310 (H) 65 - 99 mg/dL   BUN 26 (H) 6 - 20 mg/dL   Creatinine, Ser 1.47 (H) 0.61 - 1.24 mg/dL   Calcium 9.1 8.9 - 10.3 mg/dL   GFR calc non Af Amer 56 (L) >60 mL/min  GFR calc Af Amer >60 >60 mL/min    Comment: (NOTE) The eGFR has been calculated using the CKD EPI  equation. This calculation has not been validated in all clinical situations. eGFR's persistently <60 mL/min signify possible Chronic Kidney Disease.    Anion gap 8 5 - 15  CBC     Status: Abnormal   Collection Time: 11/28/16  5:15 AM  Result Value Ref Range   WBC 6.6 3.8 - 10.6 K/uL   RBC 3.40 (L) 4.40 - 5.90 MIL/uL   Hemoglobin 8.7 (L) 13.0 - 18.0 g/dL   HCT 26.5 (L) 40.0 - 52.0 %   MCV 78.0 (L) 80.0 - 100.0 fL   MCH 25.5 (L) 26.0 - 34.0 pg   MCHC 32.7 32.0 - 36.0 g/dL   RDW 14.8 (H) 11.5 - 14.5 %   Platelets 379 150 - 440 K/uL  Glucose, capillary     Status: Abnormal   Collection Time: 11/28/16  7:38 AM  Result Value Ref Range   Glucose-Capillary 280 (H) 65 - 99 mg/dL  Glucose, capillary     Status: Abnormal   Collection Time: 11/28/16 11:44 AM  Result Value Ref Range   Glucose-Capillary 244 (H) 65 - 99 mg/dL    Dg Ankle Complete Right  Result Date: 11/27/2016 CLINICAL DATA:  Right foot and ankle swelling.  Diabetes. EXAM: RIGHT ANKLE - COMPLETE 3+ VIEW COMPARISON:  No recent prior. FINDINGS: Diffuse soft tissue swelling. Diffuse degenerative change. Corticated bony density noted adjacent to the lateral malleolus, most likely an old fracture fragment. No evidence fracture or dislocation. IMPRESSION: Soft tissue swelling. Diffuse degenerative change. Old fracture fragment lateral malleolus. No acute bony abnormality identified. Electronically Signed   By: Marcello Moores  Register   On: 11/27/2016 08:14   Mr Foot Right Wo Contrast  Result Date: 11/27/2016 CLINICAL DATA:  Patient ambulatory to triage with steady gait, without difficulty or distress noted. Pt reports currently being tx for cellulitis of right ankle. Has been taking bactrim x 2wks but having increasing pain. EXAM: MRI OF THE RIGHT FOREFOOT WITHOUT CONTRAST TECHNIQUE: Multiplanar, multisequence MR imaging of the right foot was performed. No intravenous contrast was administered. COMPARISON:  None. FINDINGS: TENDONS Peroneal:  Peroneal longus tendon intact. Peroneal brevis intact. Posteromedial: Posterior tibial tendon intact. Flexor hallucis longus tendon intact. Flexor digitorum longus tendon intact. Anterior: Tibialis anterior tendon intact. Extensor hallucis longus tendon intact Extensor digitorum longus tendon intact. Achilles:  Intact. Plantar Fascia: Intact. LIGAMENTS Lateral: Anterior talofibular ligament intact. Posterior talofibular ligament intact. Anterior and posterior tibiofibular ligaments intact. Medial: Deltoid ligament intact. Spring ligament intact. CARTILAGE Ankle Joint: No joint effusion. Normal ankle mortise. No chondral defect. Subtalar Joints/Sinus Tarsi: Normal subtalar joints. No subtalar joint effusion. Normal sinus tarsi. Bones: Mild osteoarthritis of the first MTP joint. Severe marrow edema and areas of serpiginous T1 low signal involving the anterior talus and the navicular with a small talonavicular joint effusion. Mild marrow edema in the middle facet of the calcaneus. Mild nonspecific T2 hyperintense signal in the distal fibula and tibia. Soft Tissue: No fluid collection or hematoma. Soft tissue edema throughout the plantar musculature likely neurogenic given the patient's history of diabetes. IMPRESSION: 1. Severe marrow edema and areas of serpiginous T1 low signal involving the anterior talus and the navicular with a small talonavicular joint effusion. The appearance is nonspecific. Differential considerations include avascular necrosis versus stress related edema versus infection. 2. Mild marrow edema in the middle facet of the calcaneus which may reactive given the  adjacent changes. Electronically Signed   By: Kathreen Devoid   On: 11/27/2016 13:03   Dg Foot Complete Right  Result Date: 11/27/2016 CLINICAL DATA:  Cellulitis.  Diabetes.  Flow ulcers. EXAM: RIGHT FOOT COMPLETE - 3+ VIEW COMPARISON:  No prior. FINDINGS: Diffuse soft tissue swelling. No radiopaque foreign body. Peripheral vascular  calcification. Diffuse degenerative change. No acute or focal bony abnormality. IMPRESSION: 1. Diffuse soft tissue swelling.  Peripheral vascular disease. 2. Diffuse degenerative change.  No acute or focal bony abnormality. Electronically Signed   By: Marcello Moores  Register   On: 11/27/2016 08:13    Review of Systems  Constitutional: Negative for chills and fever.  HENT: Negative.   Eyes: Negative.   Respiratory: Negative.   Cardiovascular: Negative.   Gastrointestinal: Negative for nausea and vomiting.  Genitourinary: Negative.   Musculoskeletal:       Relate some pain in his right foot now extending up into the leg.  Skin:       Relates some swelling and redness in his right foot. Recent ulcerations on the forefoot which is healed.  Neurological:       Does relate some numbness and paresthesias from diabetic neuropathy.  Endo/Heme/Allergies: Negative.   Psychiatric/Behavioral: Negative.    Blood pressure 102/63, pulse 95, temperature 97.9 F (36.6 C), temperature source Oral, resp. rate 20, height 6' (1.829 m), weight 91.2 kg (201 lb), SpO2 95 %. Physical Exam  Cardiovascular:  DP and PT pulses are palpable. Capillary filling time intact  Musculoskeletal:  Guarded range of motion in the right foot. There is some pain on palpation around the talonavicular area medially on the right foot. Muscle testing is deferred.  Neurological:  Loss of protective threshold with monofilament wire distally in the foot and toes bilateral. Proprioception also impaired.  Skin:  There is some mild edema in the right foot and lower extremity. Some mild focal erythematous area along the medial aspect of the right rear foot. No specific cellulitis and ascending or sign of abscess or infection. Well-healed scabbed over previous ulcerations beneath the first metatarsal head bilateral with no sign of drainage or infection.    Assessment/Plan: Assessment: 1. 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 2. Diabetes with associated neuropathy.   Plan: Discussed with the patient that he does not appear to have any evidence of infection since his white count is only 6 and there does not appear to be any open wound or cellulitis. Discussed that most likely it is either an avascular process or early Charcot changes. Discussed that we need to keep him immobilized and essentially nonweightbearing until the foot shows signs of progress. Discussed with his nurse and we will have a tech from the emergency department, and placed a posterior splint on his right lower extremity. Instructed him follow-up outpatient for either cast or fracture boot with his podiatrist. We will have physical therapy see him for nonweightbearing on the right lower extremity. Patient may go home later this afternoon and be treated outpatient Durward Fortes 11/28/2016, 1:32 PM

## 2016-11-28 NOTE — Evaluation (Signed)
Physical Therapy Evaluation Patient Details Name: Keith [name] MRN: [id number] DOB: 01/06/1971 Today's Date: 11/28/2016   History of Present Illness  46 y.o. male with a known history of DM, Htn- had pressure ulcer on bottom of first toe.  Podiatry wants pt to keep as much weight off R foot as possible and when putting weight on it to keep it in the heel.  Clinical Impression  Pt did well with PT/ambulation with crutches, but clearly needed to put some weight through heel to maintain balance.  He states he can stay on the first floor if needed, but plans to crawl/scoot stairs PRN.  Pt will be home alone for 8 hr stretches, but states that they have been through this before and know how to set things up and he has helpful neighbors who can/will assist as needed.     Follow Up Recommendations No PT follow up    Equipment Recommendations       Recommendations for Other Services       Precautions / Restrictions Restrictions Weight Bearing Restrictions: Yes RLE Weight Bearing: Touchdown weight bearing (through heel only)      Mobility  Bed Mobility Overal bed mobility: Independent             General bed mobility comments: Pt easily able to get himself to EOB  Transfers Overall transfer level: Independent Equipment used: None             General transfer comment: Pt able to rise to standing w/o assist or AD, did bear weight through R heel - showed good balance and confidence  Ambulation/Gait Ambulation/Gait assistance: Supervision Ambulation Distance (Feet): 125 Feet Assistive device: Crutches       General Gait Details: Pt able to walk slowly and safely with crutches, but clearly needed WBing through the R heel to maintain balance (attempts w/o some TTWBing were unsteady)  Stairs Stairs:  (reports he will scoot/crawl down/up stairs as needed)          Wheelchair Mobility    Modified Rankin (Stroke Patients Only)       Balance Overall balance  assessment: Modified Independent                                           Pertinent Vitals/Pain Pain Assessment:  (reports only very minimal pain)    Home Living Family/patient expects to be discharged to:: Private residence Living Arrangements: Spouse/significant other Available Help at Discharge: Family;Neighbor Type of Home: House Home Access: Level entry     Home Layout: Two level Home Equipment: Crutches;Cane - quad      Prior Function Level of Independence: Independent with assistive device(s)         Comments: Pt reports he normally is able to do all he needs, no issues.     Hand Dominance        Extremity/Trunk Assessment   Upper Extremity Assessment Upper Extremity Assessment: Overall WFL for tasks assessed    Lower Extremity Assessment Lower Extremity Assessment: Overall WFL for tasks assessed (R ankle/foot not tested secondary to splint/WBing)       Communication   Communication: No difficulties  Cognition Arousal/Alertness: Awake/alert Behavior During Therapy: WFL for tasks assessed/performed Overall Cognitive Status: Within Functional Limits for tasks assessed  General Comments      Exercises     Assessment/Plan    PT Assessment Patent does not need any further PT services  PT Problem List         PT Treatment Interventions      PT Goals (Current goals can be found in the Care Plan section)  Acute Rehab PT Goals Patient Stated Goal: go home this afternoon PT Goal Formulation: All assessment and education complete, DC therapy    Frequency  (eval only)   Barriers to discharge        Co-evaluation               AM-PAC PT "6 Clicks" Daily Activity  Outcome Measure Difficulty turning over in bed (including adjusting bedclothes, sheets and blankets)?: None Difficulty moving from lying on back to sitting on the side of the bed? : None Difficulty  sitting down on and standing up from a chair with arms (e.g., wheelchair, bedside commode, etc,.)?: None Help needed moving to and from a bed to chair (including a wheelchair)?: None Help needed walking in hospital room?: None Help needed climbing 3-5 steps with a railing? : A Little 6 Click Score: 23    End of Session Equipment Utilized During Treatment: Gait belt Activity Tolerance: Patient tolerated treatment well Patient left: in bed;with call bell/phone within reach Nurse Communication: Mobility status PT Visit Diagnosis: Difficulty in walking, not elsewhere classified (R26.2)    Time: 1324-4010 PT Time Calculation (min) (ACUTE ONLY): 28 min   Charges:   PT Evaluation $PT Eval Low Complexity: 1 Procedure     PT G CodesMalachi [name], DPT 11/28/2016, 3:57 PM

## 2016-11-28 NOTE — Discharge Instructions (Signed)
Right foot NWBAT

## 2016-11-28 NOTE — Discharge Summary (Signed)
[facility] [place] at [facility]   PATIENT NAME: Keith [name]    MR#:  [id number]  DATE OF BIRTH:  31-Aug-1970  DATE OF ADMISSION:  11/27/2016 ADMITTING PHYSICIAN: Altamese Dilling, MD  DATE OF DISCHARGE: 11/28/16  PRIMARY CARE PHYSICIAN: Care, [place] [facility]    ADMISSION DIAGNOSIS:  Right ankle pain [M25.571] Right foot pain [M79.671] Osteomyelitis of right foot, unspecified type (HCC) [M86.9]  DISCHARGE DIAGNOSIS:  Right ankle pain suspected Avascular process vs Charcot joint  SECONDARY DIAGNOSIS:   Past Medical History:  Diagnosis Date  . Diabetes mellitus without complication (HCC)   . Hyperlipidemia   . Hypertension   . Neuropathy     HOSPITAL COURSE:   [name] a 46 y.o.malewith a known history of DM, Htn- had pressure ulcer on bottom of first toe, and not taking his insulin regularly, so it got worse- went to podiatry clinic was given bactrim oral 2 weeks ago- which he is still taking- and started taking his insulin regularly since then- so blood sugar under control.  * Right ankle pain with MRI changes suspect avascular process vs charcot's joint IV vanc + zosyn---no indication for abx per podiatry since no open wound or any evidence of infection. D/c abxs  MRSA screen negative Podiatry consult with Dr Keith [name] appreacited -no fever Wbc normal MRI right ankle shows Severe marrow edema and areas of serpiginous T1 low signal involving the anterior talus and the navicular with a small talonavicular joint effusion. Mild marrow edema in the middle facet of the calcaneus  * DM Cont home insuline, keep on ISS.  * Htn Cont home meds  * Hyperlipidemia Cont statin   Pt will follow his podiatry Dr Keith [name] in 1-2 weeks CONSULTS OBTAINED:  Treatment Team:  Linus Galas, DPM  DRUG ALLERGIES:   Allergies  Allergen Reactions  . Daptomycin Shortness Of Breath    Breathing issues  . Lisinopril     Cough     DISCHARGE MEDICATIONS:   Current Discharge Medication List    START taking these medications   Details  oxyCODONE 10 MG TABS Take 1 tablet (10 mg total) by mouth every 6 (six) hours as needed for severe pain or breakthrough pain. Qty: 25 tablet, Refills: 0      CONTINUE these medications which have NOT CHANGED   Details  aspirin 81 MG tablet Take 81 mg by mouth daily.    atorvastatin (LIPITOR) 40 MG tablet Take 40 mg by mouth daily.    gabapentin (NEURONTIN) 600 MG tablet Take 1,800 mg by mouth 3 (three) times daily.     polyethylene glycol (MIRALAX / GLYCOLAX) packet Take 17 g by mouth daily. Mix one tablespoon with 8oz of your favorite juice or water every day until you are having soft formed stools. Then start taking once daily if you didn't have a stool the day before. Qty: 30 each, Refills: 0    tamsulosin (FLOMAX) 0.4 MG CAPS capsule Take 1 capsule (0.4 mg total) by mouth daily. Qty: 30 capsule, Refills: 1    acetaminophen (TYLENOL) 500 MG tablet Take 500 mg by mouth every 6 (six) hours as needed.    ibuprofen (ADVIL,MOTRIN) 200 MG tablet Take 200 mg by mouth every 6 (six) hours as needed.    insulin aspart (NOVOLOG) 100 UNIT/ML injection Inject 10 Units into the skin 3 (three) times daily before meals. Per sliding scale Qty: 10 mL, Refills: 0    insulin glargine (LANTUS) 100 UNIT/ML injection 20 units subcutaneous  injection in am 80 units subcutaneous injection at bedtime Qty: 10 mL, Refills: 0    losartan (COZAAR) 50 MG tablet Take 50 mg by mouth daily.      STOP taking these medications     phenazopyridine (PYRIDIUM) 95 MG tablet         If you experience worsening of your admission symptoms, develop shortness of breath, life threatening emergency, suicidal or homicidal thoughts you must seek medical attention immediately by calling 911 or calling your MD immediately  if symptoms less severe.  You Must read complete instructions/literature along with all  the possible adverse reactions/side effects for all the Medicines you take and that have been prescribed to you. Take any new Medicines after you have completely understood and accept all the possible adverse reactions/side effects.   Please note  You were cared for by a hospitalist during your hospital stay. If you have any questions about your discharge medications or the care you received while you were in the hospital after you are discharged, you can call the unit and asked to speak with the hospitalist on call if the hospitalist that took care of you is not available. Once you are discharged, your primary care physician will handle any further medical issues. Please note that NO REFILLS for any discharge medications will be authorized once you are discharged, as it is imperative that you return to your primary care physician (or establish a relationship with a primary care physician if you do not have one) for your aftercare needs so that they can reassess your need for medications and monitor your lab values.  DATA REVIEW:   CBC   Recent Labs Lab 11/28/16 0515  WBC 6.6  HGB 8.7*  HCT 26.5*  PLT 379    Chemistries   Recent Labs Lab 11/28/16 0515  NA 133*  K 5.0  CL 98*  CO2 27  GLUCOSE 310*  BUN 26*  CREATININE 1.47*  CALCIUM 9.1    Microbiology Results   Recent Results (from the past 240 hour(s))  MRSA PCR Screening     Status: None   Collection Time: 11/27/16  4:51 PM  Result Value Ref Range Status   MRSA by PCR NEGATIVE NEGATIVE Final    Comment:        The GeneXpert MRSA Assay (FDA approved for NASAL specimens only), is one component of a comprehensive MRSA colonization surveillance program. It is not intended to diagnose MRSA infection nor to guide or monitor treatment for MRSA infections.     RADIOLOGY:  Dg Ankle Complete Right  Result Date: 11/27/2016 CLINICAL DATA:  Right foot and ankle swelling.  Diabetes. EXAM: RIGHT ANKLE - COMPLETE 3+ VIEW  COMPARISON:  No recent prior. FINDINGS: Diffuse soft tissue swelling. Diffuse degenerative change. Corticated bony density noted adjacent to the lateral malleolus, most likely an old fracture fragment. No evidence fracture or dislocation. IMPRESSION: Soft tissue swelling. Diffuse degenerative change. Old fracture fragment lateral malleolus. No acute bony abnormality identified. Electronically Signed   By: Maisie Fus  Register   On: 11/27/2016 08:14   Mr Foot Right Wo Contrast  Result Date: 11/27/2016 CLINICAL DATA:  Patient ambulatory to triage with steady gait, without difficulty or distress noted. Pt reports currently being tx for cellulitis of right ankle. Has been taking bactrim x 2wks but having increasing pain. EXAM: MRI OF THE RIGHT FOREFOOT WITHOUT CONTRAST TECHNIQUE: Multiplanar, multisequence MR imaging of the right foot was performed. No intravenous contrast was administered. COMPARISON:  None. FINDINGS:  TENDONS Peroneal: Peroneal longus tendon intact. Peroneal brevis intact. Posteromedial: Posterior tibial tendon intact. Flexor hallucis longus tendon intact. Flexor digitorum longus tendon intact. Anterior: Tibialis anterior tendon intact. Extensor hallucis longus tendon intact Extensor digitorum longus tendon intact. Achilles:  Intact. Plantar Fascia: Intact. LIGAMENTS Lateral: Anterior talofibular ligament intact. Posterior talofibular ligament intact. Anterior and posterior tibiofibular ligaments intact. Medial: Deltoid ligament intact. Spring ligament intact. CARTILAGE Ankle Joint: No joint effusion. Normal ankle mortise. No chondral defect. Subtalar Joints/Sinus Tarsi: Normal subtalar joints. No subtalar joint effusion. Normal sinus tarsi. Bones: Mild osteoarthritis of the first MTP joint. Severe marrow edema and areas of serpiginous T1 low signal involving the anterior talus and the navicular with a small talonavicular joint effusion. Mild marrow edema in the middle facet of the calcaneus. Mild  nonspecific T2 hyperintense signal in the distal fibula and tibia. Soft Tissue: No fluid collection or hematoma. Soft tissue edema throughout the plantar musculature likely neurogenic given the patient's history of diabetes. IMPRESSION: 1. Severe marrow edema and areas of serpiginous T1 low signal involving the anterior talus and the navicular with a small talonavicular joint effusion. The appearance is nonspecific. Differential considerations include avascular necrosis versus stress related edema versus infection. 2. Mild marrow edema in the middle facet of the calcaneus which may reactive given the adjacent changes. Electronically Signed   By: Elige KoHetal  Nic Lampe   On: 11/27/2016 13:03   Dg Foot Complete Right  Result Date: 11/27/2016 CLINICAL DATA:  Cellulitis.  Diabetes.  Flow ulcers. EXAM: RIGHT FOOT COMPLETE - 3+ VIEW COMPARISON:  No prior. FINDINGS: Diffuse soft tissue swelling. No radiopaque foreign body. Peripheral vascular calcification. Diffuse degenerative change. No acute or focal bony abnormality. IMPRESSION: 1. Diffuse soft tissue swelling.  Peripheral vascular disease. 2. Diffuse degenerative change.  No acute or focal bony abnormality. Electronically Signed   By: Maisie Fushomas  Register   On: 11/27/2016 08:13     Management plans discussed with the patient, family and they are in agreement.  CODE STATUS:     Code Status Orders        Start     Ordered   11/27/16 1609  Full code  Continuous     11/27/16 1608    Code Status History    Date Active Date Inactive Code Status Order ID Comments User Context   04/01/2016 12:49 AM 04/04/2016  2:41 PM Full Code [id number]  Hugelmeyer, Jon GillsAlexis, DO ED      TOTAL TIME TAKING CARE OF THIS PATIENT: 40 minutes.    Hayward Rylander M.D on 11/28/2016 at 3:55 PM  Between 7am to 6pm - Pager - [phone] After 6pm go to [email or web address] - Social [email or web address], [email or web address] [email or web address]  [facility] [place] [facility] [place] [facility]  Office  [phone]  CC: Primary care physician; [facility], [place]  [facility]

## 2016-11-28 NOTE — Progress Notes (Addendum)
[facility] - [place] at [facility]   PATIENT NAME: Keith [name]    MR#:  [id number]  DATE OF BIRTH:  1970/09/27  SUBJECTIVE:  Came in with right ankle pain  REVIEW OF SYSTEMS:   Review of Systems  Constitutional: Negative for chills, fever and weight loss.  HENT: Negative for ear discharge, ear pain and nosebleeds.   Eyes: Negative for blurred vision, pain and discharge.  Respiratory: Negative for sputum production, shortness of breath, wheezing and stridor.   Cardiovascular: Negative for chest pain, palpitations, orthopnea and PND.  Gastrointestinal: Negative for abdominal pain, diarrhea, nausea and vomiting.  Genitourinary: Negative for frequency and urgency.  Musculoskeletal: Positive for joint pain. Negative for back pain.  Neurological: Positive for weakness. Negative for sensory change, speech change and focal weakness.  Psychiatric/Behavioral: Negative for depression and hallucinations. The patient is not nervous/anxious.    Tolerating Diet:yes Tolerating PT: yes  DRUG ALLERGIES:   Allergies  Allergen Reactions  . Daptomycin Shortness Of Breath    Breathing issues  . Lisinopril     Cough    VITALS:  Blood pressure (!) 92/52, pulse 92, temperature 98.1 F (36.7 C), resp. rate 20, height 6' (1.829 m), weight 91.2 kg (201 lb), SpO2 98 %.  PHYSICAL EXAMINATION:   Physical Exam  GENERAL:  46 y.o.-year-old patient lying in the bed with no acute distress.  EYES: Pupils equal, round, reactive to light and accommodation. No scleral icterus. Extraocular muscles intact.  HEENT: Head atraumatic, normocephalic. Oropharynx and nasopharynx clear.  NECK:  Supple, no jugular venous distention. No thyroid enlargement, no tenderness.  LUNGS: Normal breath sounds bilaterally, no wheezing, rales, rhonchi. No use of accessory muscles of respiration.  CARDIOVASCULAR: S1, S2 normal. No murmurs, rubs, or gallops.  ABDOMEN: Soft, nontender, nondistended. Bowel  sounds present. No organomegaly or mass.  EXTREMITIES: No cyanosis, clubbing or edema b/l.   Right ankle mild skin discoloration NEUROLOGIC: Cranial nerves II through XII are intact. No focal Motor or sensory deficits b/l.   PSYCHIATRIC:  patient is alert and oriented x 3.  SKIN: No obvious rash, lesion, or ulcer.   LABORATORY PANEL:  CBC  Recent Labs Lab 11/28/16 0515  WBC 6.6  HGB 8.7*  HCT 26.5*  PLT 379    Chemistries   Recent Labs Lab 11/28/16 0515  NA 133*  K 5.0  CL 98*  CO2 27  GLUCOSE 310*  BUN 26*  CREATININE 1.47*  CALCIUM 9.1   Cardiac Enzymes No results for input(s): TROPONINI in the last 168 hours. RADIOLOGY:  Dg Ankle Complete Right  Result Date: 11/27/2016 CLINICAL DATA:  Right foot and ankle swelling.  Diabetes. EXAM: RIGHT ANKLE - COMPLETE 3+ VIEW COMPARISON:  No recent prior. FINDINGS: Diffuse soft tissue swelling. Diffuse degenerative change. Corticated bony density noted adjacent to the lateral malleolus, most likely an old fracture fragment. No evidence fracture or dislocation. IMPRESSION: Soft tissue swelling. Diffuse degenerative change. Old fracture fragment lateral malleolus. No acute bony abnormality identified. Electronically Signed   By: Maisie Fus  Register   On: 11/27/2016 08:14   Mr Foot Right Wo Contrast  Result Date: 11/27/2016 CLINICAL DATA:  Patient ambulatory to triage with steady gait, without difficulty or distress noted. Pt reports currently being tx for cellulitis of right ankle. Has been taking bactrim x 2wks but having increasing pain. EXAM: MRI OF THE RIGHT FOREFOOT WITHOUT CONTRAST TECHNIQUE: Multiplanar, multisequence MR imaging of the right foot was performed. No intravenous contrast was administered. COMPARISON:  None. FINDINGS: TENDONS Peroneal: Peroneal longus tendon intact. Peroneal brevis intact. Posteromedial: Posterior tibial tendon intact. Flexor hallucis longus tendon intact. Flexor digitorum longus tendon intact. Anterior:  Tibialis anterior tendon intact. Extensor hallucis longus tendon intact Extensor digitorum longus tendon intact. Achilles:  Intact. Plantar Fascia: Intact. LIGAMENTS Lateral: Anterior talofibular ligament intact. Posterior talofibular ligament intact. Anterior and posterior tibiofibular ligaments intact. Medial: Deltoid ligament intact. Spring ligament intact. CARTILAGE Ankle Joint: No joint effusion. Normal ankle mortise. No chondral defect. Subtalar Joints/Sinus Tarsi: Normal subtalar joints. No subtalar joint effusion. Normal sinus tarsi. Bones: Mild osteoarthritis of the first MTP joint. Severe marrow edema and areas of serpiginous T1 low signal involving the anterior talus and the navicular with a small talonavicular joint effusion. Mild marrow edema in the middle facet of the calcaneus. Mild nonspecific T2 hyperintense signal in the distal fibula and tibia. Soft Tissue: No fluid collection or hematoma. Soft tissue edema throughout the plantar musculature likely neurogenic given the patient's history of diabetes. IMPRESSION: 1. Severe marrow edema and areas of serpiginous T1 low signal involving the anterior talus and the navicular with a small talonavicular joint effusion. The appearance is nonspecific. Differential considerations include avascular necrosis versus stress related edema versus infection. 2. Mild marrow edema in the middle facet of the calcaneus which may reactive given the adjacent changes. Electronically Signed   By: Elige KoHetal  Dianelys Scinto   On: 11/27/2016 13:03   Dg Foot Complete Right  Result Date: 11/27/2016 CLINICAL DATA:  Cellulitis.  Diabetes.  Flow ulcers. EXAM: RIGHT FOOT COMPLETE - 3+ VIEW COMPARISON:  No prior. FINDINGS: Diffuse soft tissue swelling. No radiopaque foreign body. Peripheral vascular calcification. Diffuse degenerative change. No acute or focal bony abnormality. IMPRESSION: 1. Diffuse soft tissue swelling.  Peripheral vascular disease. 2. Diffuse degenerative change.  No acute  or focal bony abnormality. Electronically Signed   By: Maisie Fushomas  Register   On: 11/27/2016 08:13   ASSESSMENT AND PLAN:   Keith [name]  is a 46 y.o. male with a known history of DM, Htn- had pressure ulcer on bottom of first toe, and not taking his insulin regularly, so it got worse- went to podiatry clinic was given bactrim oral 2 weeks ago- which he is still taking- and started taking his insulin regularly since then- so blood sugar under control.  * Right ankle pain with mild cellulitis   IV vanc + zosyn  MRSA screen negative  Podiatry consult with Dr Alberteen Spindleline -no fever Wbc normal MRI right ankle shows Severe marrow edema and areas of serpiginous T1 low signal involving the anterior talus and the navicular with a small talonavicular joint effusion. Mild marrow edema in the middle facet of the calcaneus  * DM   Cont home insuline, keep on ISS.  * Htn   Cont home meds  * Hyperlipidemia   Cont statin   Case discussed with Care Management/Social Worker. Management plans discussed with the patient, family and they are in agreement.  CODE STATUS: full  DVT Prophylaxis: Lovenox  TOTAL TIME TAKING CARE OF THIS PATIENT: 30 minutes.  >50% time spent on counselling and coordination of care  POSSIBLE D/C IN 1-2 DAYS, DEPENDING ON CLINICAL CONDITION.  Note: This dictation was prepared with Dragon dictation along with smaller phrase technology. Any transcriptional errors that result from this process are unintentional.  Carsyn Taubman M.D on 11/28/2016 at 8:18 AM  Between 7am to 6pm - Pager - [phone]  After 6pm go to [email or web address] - password [email or web address]  [facility] [place] [facility]  Office  [phone]  CC: Primary care physician; [facility], [place] [facility]

## 2016-12-02 ENCOUNTER — Ambulatory Visit (INDEPENDENT_AMBULATORY_CARE_PROVIDER_SITE_OTHER): Payer: BC Managed Care – PPO | Admitting: Podiatry

## 2016-12-02 ENCOUNTER — Encounter: Payer: Self-pay | Admitting: Podiatry

## 2016-12-02 DIAGNOSIS — M109 Gout, unspecified: Secondary | ICD-10-CM

## 2016-12-02 DIAGNOSIS — M7751 Other enthesopathy of right foot: Secondary | ICD-10-CM

## 2016-12-02 MED ORDER — BETAMETHASONE SOD PHOS & ACET 6 (3-3) MG/ML IJ SUSP: 3.0000 mg | Freq: Once | INTRAMUSCULAR | Status: AC

## 2016-12-02 NOTE — Progress Notes (Signed)
   HPI:  Patient with a history of diabetes mellitus presents today for follow-up evaluation of right foot and ankle pain. Patient states that after the last visit the oral antibiotic was not improving his symptoms and he was having significant amount of pain to his right ankle. The cellulitis noted to the medial aspect of the right ankle had been unchanged. Patient went to [facility] emergency department [facility] where MRI was performed and was consistent with bone marrow and soft tissue edema throughout the ankle joint. Patient presents today for further treatment and evaluation    Physical Exam: General: The patient is alert and oriented x3 in no acute distress.  Dermatology: Skin is warm, dry and supple bilateral lower extremities. Negative for open lesions or macerations.  Vascular: Localized erythema with edema noted throughout the medial aspect of the right ankle joint consistent with possible cellulitis or gout or idiopathic inflammation due to degenerative changes within the ankle and foot joints.  Neurological: Epicritic and protective threshold diminished bilaterally.   Musculoskeletal Exam: Significant pain on palpation noted to the medial aspect patient's right ankle joint.   Assessment: 1. 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 Cellulitis right ankle  3. Diabetes mellitus with peripheral polyneuropathy 4. Pre-ulcerative callous lesions sub-first MPJ bilateral   Plan of Care:  1. Patient was evaluated. 2. Injection of 0.5 mL Celestone Soluspan injected in the patient's right ankle joint medial aspect. 3. Immobilization cam boot dispensed today 4. Today orders were placed for uric acid levels. Patient is positive for elevated uric acid levels consistent with gout we will prescribe colchicine 0.6 mg 5 days 5. Return to clinic in 2 weeks   Felecia ShellingBrent M. Kenniya Westrich, DPM [facility]  Dr. Felecia ShellingBrent M. Lucella Pommier, DPM    [address]                                         [place], [place] [address]                Office [phone]  Fax [phone]

## 2016-12-03 ENCOUNTER — Telehealth: Payer: Self-pay | Admitting: Podiatry

## 2016-12-03 LAB — URIC ACID: Uric Acid: 7.3 mg/dL (ref 3.7–8.6)

## 2016-12-03 NOTE — Telephone Encounter (Signed)
Dr. Logan BoresEvans states the labs do not at this time indicate gout. Call pt to check status after the injection and have pt remain in the cam walker. Left message with Dr. Logan BoresEvans orders.

## 2016-12-03 NOTE — Telephone Encounter (Signed)
Patient was seen in [place] yesterday by Dr. Logan BoresEvans. Was given n order for [facility] to get tested for gout. Pt was told to call us today for results.  He wants to know the results of the testing and also if and when he will start on medication. Please call him on hone number to advise.  Thanks !

## 2016-12-03 NOTE — Telephone Encounter (Signed)
Opened in error. Pt called this morning about his lab results wanting someone to call him to let him know if he has gout, if so please call in his medication today.

## 2016-12-04 ENCOUNTER — Telehealth: Payer: Self-pay | Admitting: *Deleted

## 2016-12-04 DIAGNOSIS — R52 Pain, unspecified: Secondary | ICD-10-CM

## 2016-12-04 DIAGNOSIS — M71071 Abscess of bursa, right ankle and foot: Secondary | ICD-10-CM

## 2016-12-04 DIAGNOSIS — E0842 Diabetes mellitus due to underlying condition with diabetic polyneuropathy: Secondary | ICD-10-CM

## 2016-12-04 MED ORDER — COLCHICINE 0.6 MG PO TABS: 0.6000 mg | ORAL_TABLET | Freq: Every day | ORAL | 0 refills | Status: DC

## 2016-12-04 NOTE — Telephone Encounter (Addendum)
Pt called states is still having pain, taking the pain medications prescribed by the other doctor. Offered pt an appt pt declined. Dr. Logan BoresEvans stated if pt is having pain, call in the Colchicine as noted.12/09/2016-Pt states he has had some improvement on the 5 colchicine, but would like 5 more. Dr. Philomena DohenyEvans okayed the refill and would like to make sure pt follows up in office. I informed pt of Dr. Logan BoresEvans orders and pt states understanding and has an appt in 2 weeks.12/11/2016-Pt's wife, Toni AmendCourtney states she is very concerned, pt is in a lot of pain and is unable to walk without pain, and she would like to discuss treatment. I left message informing Toni AmendCourtney, we would know more about treatment options once pt was seen next week, possible repeat of labs, x-ray, to begin conservative therapies to include rest, ice therapy until seen. 12/12/2016-[facility]ed to [facility].12/15/2016-Referral, clinicals and demographic [facility].12/29/2016-D. Miner states pt says his wife was told he would be started on a PICC line for the antibiotics. I told D. Miner - Scheduler to message Dr. Logan BoresEvans directly for orders. Cherre Blanc. Honeycutt - Scheduler states pt has called since Thursday and Friday for information concerning the PICC Line. I told T. Honeycutt I would call Dr. Logan BoresEvans for orders. Dr. Logan BoresEvans states he sent orders to [facility] to refer pt to Infectious Disease for right septic ankle, uncontrolled diabetes, and consult as soon as possible for evaluation and treatment and IV antibiotics. I informed T. Honeycutt that the orders had been sent to [facility] by Dr. Logan BoresEvans this morning and if she had not begun the process, I would contact [facility] to get pt referred. I spoke with Deidra Gavin Potters- [facility] and she said she would need to send a message back to the Infectious Disease department. I told Deidra I would like to speak with the Infectious Disease nurse  the pt needed to be seen as soon as possible and I gave Diedra diagnosis of right ankle joint sepsis, uncontrolled diabetic referral for consult as soon as possible for evaluation and treatment and IV antibiotics. [facility] Dr. Sampson GoonFitzgerald is booking out 3 weeks, suggested trying Infectious Disease in [place]. I spoke with Marcelino DusterMichelle - [facility] and she said have Dr. Logan BoresEvans contact Doctor-on-Call - Dr. Orvan Falconerampbell [phone]. I left 2 messages for Dr.Evans to contact Dr. Orvan Falconerampbell on his on-call line. I informed pt the orders had been given to the nurse in the [place] office, and I had taken over to process with [facility], that was appointing new patients 3 weeks out, and  I was currently referring him to [facility] and had messages in to Dr. Logan BoresEvans to contact their Doctor-on-Call -Dr. Orvan Falconerampbell to expedite the referral appointment. I told pt to continue the oral antibiotics and either I would contact him with the appt or [facility] would call hime. Faxed required form, clinicals and demographics to [facility].01/08/2017-T[facility] - Infectious Disease states Dr. Orvan Falconerampbell is seeing pt and needs clinicals and cultures.

## 2016-12-04 NOTE — Telephone Encounter (Signed)
ENTERED IN ERROR

## 2016-12-09 MED ORDER — COLCHICINE 0.6 MG PO TABS: 0.6000 mg | ORAL_TABLET | Freq: Every day | ORAL | 0 refills | Status: DC

## 2016-12-12 ENCOUNTER — Ambulatory Visit (INDEPENDENT_AMBULATORY_CARE_PROVIDER_SITE_OTHER): Payer: BC Managed Care – PPO

## 2016-12-12 ENCOUNTER — Other Ambulatory Visit: Payer: Self-pay | Admitting: Podiatry

## 2016-12-12 ENCOUNTER — Encounter: Payer: Self-pay | Admitting: Podiatry

## 2016-12-12 ENCOUNTER — Ambulatory Visit (INDEPENDENT_AMBULATORY_CARE_PROVIDER_SITE_OTHER): Payer: BC Managed Care – PPO | Admitting: Podiatry

## 2016-12-12 DIAGNOSIS — R600 Localized edema: Secondary | ICD-10-CM | POA: Diagnosis not present

## 2016-12-12 DIAGNOSIS — M109 Gout, unspecified: Secondary | ICD-10-CM

## 2016-12-12 DIAGNOSIS — M25571 Pain in right ankle and joints of right foot: Secondary | ICD-10-CM | POA: Diagnosis not present

## 2016-12-12 DIAGNOSIS — M7751 Other enthesopathy of right foot: Secondary | ICD-10-CM | POA: Diagnosis not present

## 2016-12-12 DIAGNOSIS — R609 Edema, unspecified: Secondary | ICD-10-CM

## 2016-12-12 DIAGNOSIS — M79661 Pain in right lower leg: Secondary | ICD-10-CM

## 2016-12-12 MED ORDER — TRAMADOL HCL 50 MG PO TABS: 50.0000 mg | ORAL_TABLET | Freq: Four times a day (QID) | ORAL | 0 refills | Status: DC | PRN

## 2016-12-12 MED ORDER — IBUPROFEN 800 MG PO TABS: 800.0000 mg | ORAL_TABLET | Freq: Three times a day (TID) | ORAL | 1 refills | Status: AC | PRN

## 2016-12-12 NOTE — Telephone Encounter (Signed)
-----   Message from Constance HawAngela D Venable, LPN sent at 0/9/81196/07/2016  3:12 PM EDT ----- Per Dr. Logan BoresEvans, Please order RLE [facility]ppler [facility] Dx: RLE edema and calf pain.  Thanks

## 2016-12-13 LAB — ARTHRITIS PANEL
ANA: NEGATIVE
Rhuematoid fact SerPl-aCnc: 18.5 IU/mL — ABNORMAL HIGH (ref 0.0–13.9)
Sed Rate: 103 mm/hr — ABNORMAL HIGH (ref 0–15)
URIC ACID: 6.6 mg/dL (ref 3.7–8.6)

## 2016-12-15 NOTE — Telephone Encounter (Signed)
-----   Message from Felecia ShellingBrent M Evans, DPM sent at 12/15/2016  7:47 AM EDT ----- Regarding: Referral to Rheumatology Please refer patient to rheumatologist.  Dx: Resolving right ankle erythema with pain. Increased rheumatoid factor.   Thanks, Dr. Logan BoresEvans

## 2016-12-15 NOTE — Progress Notes (Deleted)
12/16/2016 8:09 AM   Keith [name] 03/14/1971 [id number]  Referring provider: [facility], [facility] [address] [place], [place] [address]  No chief complaint on file.   HPI: 46 yo WM with uncontrolled type 1 DM with urinary retention and ED who presents today for a one month follow up to review PVR's.      Background history  Consultation for urinary retention. Patient is a 46 year old white male insulin-dependent diabetic who initially presented April 4 with dysuria. He had a pansensitive staph on urine culture. UA showed many bacteria with too numerous to count white cells. He was treated with cephalexin. He presented again April 13 now with trouble urinating. Repeat urine culture was negative. A bladder scan revealed greater than a liter in the bladder and the Foley was placed draining his bladder. He was constipated. A CT scan  which was normal. Bladder was decompressed with the Foley. He was started on tamsulosin. Prostate with mild BPH on exam and CT. Prior to foley he was having to strain to void.  Neurogenic wrist includes insulin-dependent diabetes. He was not taking insulin for 6 months and began to develop complications. He has PN. He's lost a lot of weight.  BPH WITH LUTS His IPSS score today is ***, which is *** lower urinary tract symptomatology. He is *** with his quality life due to his urinary symptoms.   His previous I PSS score was 22/6.  He has been managing his urinary retention with CIC.  He failed a TOV on 11/04/2016 with 1225 mL in his bladder.  He has had these symptoms for one month.  He denies any dysuria, hematuria or suprapubic pain.  He currently taking tamsulosin 0.4 mg daily.  He also denies any recent fevers, chills, nausea or vomiting.  His father was diagnosed with a highly aggressive prostate cancer.      IPSS    Row Name 11/18/16 0800         International Prostate Symptom Score   How often have you had the sensation of not emptying your bladder? About  half the time     How often have you had to urinate less than every two hours? About half the time     How often have you found you stopped and started again several times when you urinated? More than half the time     How often have you found it difficult to postpone urination? Not at All     How often have you had a weak urinary stream? Almost always     How often have you had to strain to start urination? Almost always     How many times did you typically get up at night to urinate? 2 Times     Total IPSS Score 22       Quality of Life due to urinary symptoms   If you were to spend the rest of your life with your urinary condition just the way it is now how would you feel about that? Terrible        Score:  1-7 Mild 8-19 Moderate 20-35 Severe   Erectile dysfunction His SHIM score is 13, which is mild to moderate ED.   He has been having difficulty with erections for 3 to 4 years ago.   His major complaint is no erections.  His libido is diminshed.   His risk factors for ED are age, DM and HLD.  He denies any painful erections or curvatures  with his erections.   He is no longer having spontaneous erections.  He has tried Viagra in the past with no success.     SHIM    Row Name 11/18/16 0900         SHIM: Over the last 6 months:   How do you rate your confidence that you could get and keep an erection? Very Low     When you had erections with sexual stimulation, how often were your erections hard enough for penetration (entering your partner)? A Few Times (much less than half the time)     During sexual intercourse, how often were you able to maintain your erection after you had penetrated (entered) your partner? A Few Times (much less than half the time)     During sexual intercourse, how difficult was it to maintain your erection to completion of intercourse? Difficult     When you attempted sexual intercourse, how often was it satisfactory for you? Almost Always or Always         SHIM Total Score   SHIM 13        Score: 1-7 Severe ED 8-11 Moderate ED 12-16 Mild-Moderate ED 17-21 Mild ED 22-25 No ED  PMH: Past Medical History:  Diagnosis Date  . Diabetes mellitus without complication (HCC)   . Hyperlipidemia   . Hypertension   . Neuropathy     Surgical History: Past Surgical History:  Procedure Laterality Date  . IRRIGATION AND DEBRIDEMENT FOOT Left 04/02/2016   Procedure: IRRIGATION AND DEBRIDEMENT FOOT;  Surgeon: Gwyneth Revels, DPM;  Location: [facility]S;  Service: Podiatry;  Laterality: Left;    Home Medications:  Allergies as of 12/16/2016      Reactions   Daptomycin Shortness Of Breath   Breathing issues   Lisinopril    Cough      Medication List       Accurate as of 12/15/16  8:09 AM. Always use your most recent med list.          acetaminophen 500 MG tablet Commonly known as:  TYLENOL Take 500 mg by mouth every 6 (six) hours as needed.   aspirin 81 MG tablet Take 81 mg by mouth daily.   atorvastatin 40 MG tablet Commonly known as:  LIPITOR Take 40 mg by mouth daily.   colchicine 0.6 MG tablet Take 1 tablet (0.6 mg total) by mouth daily.   colchicine 0.6 MG tablet Take 1 tablet (0.6 mg total) by mouth daily.   gabapentin 600 MG tablet Commonly known as:  NEURONTIN Take 1,800 mg by mouth 3 (three) times daily.   ibuprofen 200 MG tablet Commonly known as:  ADVIL,MOTRIN Take 200 mg by mouth every 6 (six) hours as needed.   ibuprofen 800 MG tablet Commonly known as:  ADVIL,MOTRIN Take 1 tablet (800 mg total) by mouth every 8 (eight) hours as needed.   insulin aspart 100 UNIT/ML injection Commonly known as:  novoLOG Inject 10 Units into the skin 3 (three) times daily before meals. Per sliding scale   insulin glargine 100 UNIT/ML injection Commonly known as:  LANTUS 20 units subcutaneous injection in am 80 units subcutaneous injection at bedtime   losartan 50 MG tablet Commonly known as:  COZAAR Take 50 mg by  mouth daily.   Oxycodone HCl 10 MG Tabs Take 1 tablet (10 mg total) by mouth every 6 (six) hours as needed for severe pain or breakthrough pain.   polyethylene glycol packet Commonly known as:  MIRALAX /  GLYCOLAX Take 17 g by mouth daily. Mix one tablespoon with 8oz of your favorite juice or water every day until you are having soft formed stools. Then start taking once daily if you didn't have a stool the day before.   tamsulosin 0.4 MG Caps capsule Commonly known as:  FLOMAX Take 1 capsule (0.4 mg total) by mouth daily.   traMADol 50 MG tablet Commonly known as:  ULTRAM Take 1 tablet (50 mg total) by mouth every 6 (six) hours as needed.       Allergies:  Allergies  Allergen Reactions  . Daptomycin Shortness Of Breath    Breathing issues  . Lisinopril     Cough    Family History: Family History  Problem Relation Age of Onset  . Prostate cancer Father   . Bladder Cancer Neg Hx   . Kidney cancer Neg Hx     Social History:  reports that he has never smoked. He has never used smokeless tobacco. He reports that he does not drink alcohol or use drugs.  ROS:                                        Physical Exam: There were no vitals taken for this visit.  Constitutional:  Alert and oriented, No acute distress. HEENT: [place] AT, moist mucus membranes.  Trachea midline, no masses. Cardiovascular: No clubbing, cyanosis, or edema. Respiratory: Normal respiratory effort, no increased work of breathing. Skin: No rashes, bruises or suspicious lesions. Lymph: No cervical or inguinal adenopathy. Neurologic: Grossly intact, no focal deficits, moving all 4 extremities. Psychiatric: Normal mood and affect.  Laboratory Data: Lab Results  Component Value Date   WBC 6.6 11/28/2016   HGB 8.7 (L) 11/28/2016   HCT 26.5 (L) 11/28/2016   MCV 78.0 (L) 11/28/2016   PLT 379 11/28/2016    Lab Results  Component Value Date   CREATININE 1.47 (H) 11/28/2016      Lab Results  Component Value Date   HGBA1C 13.9 (H) 04/01/2016     Assessment & Plan:    1.  Urinary retention  -   2. Erectile dysfunction  - SHIM score is 13  - I explained to the patient that in order to achieve an erection it takes good functioning of the nervous system (parasympathetic, sympathetic, sensory and motor), good blood flow into the erectile tissue of the penis and a desire to have sex  - I explained that conditions like diabetes, hypertension, coronary artery disease, peripheral vascular disease, smoking, alcohol consumption, age, sleep apnea and BPH can diminish the ability to have an erection  - A recent study published in Sex Med 2018 Apr 13 revealed moderate to vigorous aerobic exercise for 40 minutes 4 times per week can decrease erectile problems caused by physical inactivity, obesity, hypertension, metabolic syndrome and/or cardiovascular diseases  - We discussed trying a different PDE5 inhibitor and intracavernous vasoactive drug injection therapy - his wife would like to try Trimix injection - prescription called in for a test vial and patient will schedule a Trimix titration.      No Follow-up on file.  Michiel Cowboy, PA-C  [facility] [address] [place], [place] [address] [phone]

## 2016-12-15 NOTE — Progress Notes (Signed)
   HPI:  Patient with a history of diabetes mellitus presents today for follow-up evaluation of right foot and ankle pain. Patient states that the pain has unimproved and he is unable to bear weight due to significant amounts of pain to the right ankle. Patient appears very frustrated at the moment due to unresolving symptoms and lack of a definitive diagnosis. Patient was recently [facility] to [facility], however he feels that he was discharged early. MRI was negative for any significant findings other than bone marrow edema. Uric acid levels of 7.3mg /dL were within normal limits as well on 12/16/2016. Patient presents today for further treatment and evaluation   Physical Exam: General: The patient is alert and oriented x3 in no acute distress.  Dermatology: Skin is warm, dry and supple bilateral lower extremities. Negative for open lesions or macerations.  Vascular: Localized erythema with edema noted throughout the medial aspect of the right ankle joint consistent with possible cellulitis or gout or idiopathic inflammation due to degenerative changes within the ankle and foot joints.  Neurological: Epicritic and protective threshold diminished bilaterally.   Musculoskeletal Exam: Significant pain on palpation noted to the medial aspect patient's right ankle joint.   Assessment: 1. 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 Cellulitis right ankle with erythema  3. Diabetes mellitus with peripheral polyneuropathy 4. Pre-ulcerative callous lesions sub-first MPJ bilateral   Plan of Care:  1. Patient was evaluated. Today MRI, recent blood work, and chemistries were reviewed with the patient 2. Today had a very detailed discussion with the patient regarding the patient's unique findings and unresolving symptoms. I discussed with the patient the differential diagnoses including but not limited to cellulitis with infection, acute gout, possible diabetic Charcot, arthritic and  degenerative changes to the ankle. Today going to order an arthritic panel for further evaluation.  3. Unna boot was applied with immobilization cam walker 4. Prescription for tramadol 50 mg  5. Return to clinic in 1 week  Felecia ShellingBrent M. Khila Papp, DPM [facility]  Dr. Felecia ShellingBrent M. Amair Shrout, DPM    [address]                                        [place], [place] [address]                Office [phone]  Fax [phone]

## 2016-12-16 ENCOUNTER — Ambulatory Visit
Admission: RE | Admit: 2016-12-16 | Discharge: 2016-12-16 | Disposition: A | Discharge status: Home / Self Care | Payer: BC Managed Care – PPO | Source: Ambulatory Visit | Attending: Podiatry | Admitting: Podiatry

## 2016-12-16 ENCOUNTER — Encounter: Payer: Self-pay | Admitting: Urology

## 2016-12-16 ENCOUNTER — Ambulatory Visit: Payer: BC Managed Care – PPO | Admitting: Podiatry

## 2016-12-16 ENCOUNTER — Ambulatory Visit: Payer: BC Managed Care – PPO | Admitting: Urology

## 2016-12-16 DIAGNOSIS — R609 Edema, unspecified: Secondary | ICD-10-CM

## 2016-12-16 DIAGNOSIS — M79661 Pain in right lower leg: Secondary | ICD-10-CM

## 2016-12-19 ENCOUNTER — Encounter: Payer: Self-pay | Admitting: Podiatry

## 2016-12-19 ENCOUNTER — Ambulatory Visit (INDEPENDENT_AMBULATORY_CARE_PROVIDER_SITE_OTHER): Payer: BC Managed Care – PPO | Admitting: Podiatry

## 2016-12-19 DIAGNOSIS — E0842 Diabetes mellitus due to underlying condition with diabetic polyneuropathy: Secondary | ICD-10-CM | POA: Diagnosis not present

## 2016-12-19 DIAGNOSIS — M71071 Abscess of bursa, right ankle and foot: Secondary | ICD-10-CM | POA: Diagnosis not present

## 2016-12-19 MED ORDER — OXYCODONE HCL 10 MG PO TABS: 10.0000 mg | ORAL_TABLET | Freq: Four times a day (QID) | ORAL | 0 refills | Status: DC | PRN

## 2016-12-19 MED ORDER — AMOXICILLIN-POT CLAVULANATE 875-125 MG PO TABS: 1.0000 | ORAL_TABLET | Freq: Two times a day (BID) | ORAL | 0 refills | Status: DC

## 2016-12-22 ENCOUNTER — Telehealth: Payer: Self-pay | Admitting: *Deleted

## 2016-12-22 DIAGNOSIS — G8929 Other chronic pain: Secondary | ICD-10-CM | POA: Insufficient documentation

## 2016-12-22 DIAGNOSIS — R768 Other specified abnormal immunological findings in serum: Secondary | ICD-10-CM | POA: Insufficient documentation

## 2016-12-22 DIAGNOSIS — R937 Abnormal findings on diagnostic imaging of other parts of musculoskeletal system: Secondary | ICD-10-CM | POA: Insufficient documentation

## 2016-12-22 DIAGNOSIS — M86071 Acute hematogenous osteomyelitis, right ankle and foot: Secondary | ICD-10-CM | POA: Insufficient documentation

## 2016-12-22 DIAGNOSIS — M79671 Pain in right foot: Secondary | ICD-10-CM

## 2016-12-22 NOTE — Telephone Encounter (Signed)
"  I'm returning your call.  Dr. Logan BoresEvans said he would do it on Thursday.  "Okay, thank you so much."  You can go ahead and register with the surgical center.  Someone from the surgical center will call you with the arrival time a day or two prior to the surgery date.

## 2016-12-22 NOTE — Telephone Encounter (Signed)
"  I'm calling to schedule my surgery with Dr. Logan BoresEvans.  I'd like to do it this week because my foot is hurting me so bad."  Dr. Logan BoresEvans is not doing surgery this week.  He can do it on June 21.  "Okay, I guess that will have to do."  Someone from the surgical center will call you with the arrival time a day or two prior to surgery date.  You can go ahead and register with the surgical center, instructions are in the brochure that was given to you.

## 2016-12-22 NOTE — Telephone Encounter (Signed)
"  I went to the Arthritic doctor today at [facility].  The doctor there looked at it she said it's infected and surgery needs to be done now.  I don't think I can wait until June 21.  It's oozing through the bandage.  She said she was going to contact Dr. Logan BoresEvans today about it.  This needs to be done soon.  If he can't do it, someone needs to do it.  I don't want anything else to happen.  I don't want to lose my foot.  My skin is separating.  I'm scared, I'm scared to death."

## 2016-12-25 ENCOUNTER — Encounter: Payer: Self-pay | Admitting: Podiatry

## 2016-12-25 DIAGNOSIS — M86679 Other chronic osteomyelitis, unspecified ankle and foot: Secondary | ICD-10-CM | POA: Diagnosis not present

## 2016-12-26 DIAGNOSIS — D649 Anemia, unspecified: Secondary | ICD-10-CM | POA: Insufficient documentation

## 2016-12-29 ENCOUNTER — Telehealth: Payer: Self-pay | Admitting: Podiatry

## 2016-12-29 NOTE — Telephone Encounter (Signed)
Patient called today stating he had surgery with Dr. Logan BoresEvans on Thursday, 12/25/16.  He is a diabetic, and his infection has possibly gone into the bone. Dr. Logan BoresEvans advised his wife while he was in recovery that he was going to insert a pick line so that the patient could get direct IV antibotics immediately.  This was not installed on Thursday prior to him leaving the surgery center. Patient states he has been calling since Thursday and no one has returned his calls. He called the [place] office today, and was told by Joya SanValery that she knew nothing about a pick line for him. Patient has been on Amoxicillin since prior to the surgery but wanted to know why Dr. Logan BoresEvans didn't take care of the pick line?  He is afraid that his infection will get into the bone resulting in an amputation.  Patient would like a call about this asap.  Valery:  I spoke with you earlier about this, just making sure that Dr. Almyra BraceEvans/ Angie are kept in the loop.

## 2016-12-29 NOTE — Telephone Encounter (Signed)
Pt called states when he had surgery you told wife that pt needed iv antibiotics for possible bone infection and we would be setting it up. Per Val she does not have any orders

## 2016-12-29 NOTE — Progress Notes (Signed)
   HPI: 46 year old male with a history of uncontrolled diabetes mellitus presents today for follow-up evaluation of erythema with pain and swelling to the right ankle. Different modalities of been unsuccessful in providing any sort of alleviation or improvement of symptoms. MRI, recent blood work, and chemistries have been reviewed in the past without any significant findings. Patient presents today for follow-up treatment and evaluation. Patient states he still has continued significant pain to the right lower extremity.   Physical Exam: General: The patient is alert and oriented x3 in no acute distress.  Dermatology: Significant amount of erythema and edema noted to the medial aspect of the right ankle joint. There appears to be some skin breakdown with serous drainage noted. This is not present in the past visits.  Vascular: Palpable pedal pulses bilaterally. No edema or erythema noted. Capillary refill within normal limits.  Neurological: Epicritic and protective threshold grossly intact bilaterally.   Musculoskeletal Exam: Significant amount of pain on palpation to the right ankle and proximal leg  Assessment: 1. Cellulitis right ankle and right lower extremity 2. Edema right lower extremity 3. Possible ankle joint septic abscess   Plan of Care:  1. Patient was evaluated. 2. Today we decided that although there is been no improvement with antibiotics in the past we are going to consider this a cellulitic ankle joint. Patient will require surgical intervention. Labs were also reviewed today which were abnormal for rheumatoid factor. Follow up with rheumatologist. 3. POSSIBLE complications and details of the procedure regarding incision and drainage of the right ankle joint were explained. No guarantees were expressed or implied. All patient questions were answered. 4. Prescription for Percocet 5/325mg   5. Return to clinic 1 week postop   Felecia ShellingBrent M. Deshanta Lady, DPM [facility]  Dr. Felecia ShellingBrent M. Glennice Marcos, DPM    [address]                                        [place], [place] [address]                Office [phone](336) [phone]  Fax [phone](336) [phone]

## 2016-12-30 NOTE — Telephone Encounter (Signed)
Keith [name], can you arrange this? Dr. Logan BoresEvans

## 2016-12-31 ENCOUNTER — Telehealth: Payer: Self-pay | Admitting: Podiatry

## 2016-12-31 MED ORDER — AMOXICILLIN-POT CLAVULANATE 875-125 MG PO TABS: 1.0000 | ORAL_TABLET | Freq: Two times a day (BID) | ORAL | 0 refills | Status: DC

## 2016-12-31 NOTE — Telephone Encounter (Signed)
Pt needs antibiotic refill from last week.Send to [facility] in [place]. He has 2 pills left

## 2016-12-31 NOTE — Telephone Encounter (Addendum)
Dr. Philomena DohenyEvans okayed refill of Augmentin.01/01/2017-Dr. Logan BoresEvans states he received pt's cultures from surgery at the surgical center today, and the culture is sensitive to everything, contact pt if doing well can cancel Infectious Disease consult, but if questionable or pt wants to continue, leave referral to Infectious Disease. I spoke with pt, and informed of Dr. Logan BoresEvans review of culture results and that the organism is sensitive to all antibiotics including Augmentin. Pt states he has had decrease swelling and continues to have a great deal of pain, and would like to continue to be evaluated by Infectious Disease. Pt states he has an appointment 11:00am tomorrow with Dr. Orvan Falconerampbell, and will discuss appt with Dr. Logan BoresEvans in the afternoon.

## 2017-01-01 NOTE — Telephone Encounter (Signed)
Thanks Valery! 

## 2017-01-02 ENCOUNTER — Ambulatory Visit: Payer: BC Managed Care – PPO | Admitting: Internal Medicine

## 2017-01-02 ENCOUNTER — Encounter: Payer: Self-pay | Admitting: Podiatry

## 2017-01-02 ENCOUNTER — Ambulatory Visit (INDEPENDENT_AMBULATORY_CARE_PROVIDER_SITE_OTHER): Payer: BC Managed Care – PPO | Admitting: Podiatry

## 2017-01-02 DIAGNOSIS — M71071 Abscess of bursa, right ankle and foot: Secondary | ICD-10-CM

## 2017-01-02 DIAGNOSIS — E0842 Diabetes mellitus due to underlying condition with diabetic polyneuropathy: Secondary | ICD-10-CM

## 2017-01-02 DIAGNOSIS — Z9889 Other specified postprocedural states: Secondary | ICD-10-CM

## 2017-01-02 MED ORDER — TRAMADOL HCL 50 MG PO TABS: 50.0000 mg | ORAL_TABLET | Freq: Three times a day (TID) | ORAL | 0 refills | Status: DC | PRN

## 2017-01-04 ENCOUNTER — Other Ambulatory Visit: Payer: Self-pay | Admitting: Urology

## 2017-01-06 NOTE — Progress Notes (Signed)
   Subjective:  Patient presents today status post incision and drainage of an abscess to the right ankle. DOS: 12/25/16. He states he wear the cam boot yesterday which caused a lot of bloody discharge from the incision soaking the dressing. He states he is unable to get the packing out due to stitches.     Objective/Physical Exam Skin incision noted to the right medial ankle appears somewhat coapted. Iodoform packing is intact. StaplesN sutures are intact. It appears that. Incisional erythema and redness has decreased somewhat since last visit. There is some modest improvement noted postoperatively.  Assessment: 1. s/p incision and drainage of right ankle DOS: 12/25/16 2. Diabetes mellitus with polyneuropathy  Plan of Care:  1. Patient was evaluated. 2. Continue taking Augmentin. 3. Prescription for tramadol 50 mg given to patient. 4. Dry sterile dressings were applied. Some of the staples and sutures removed in order to remove the packing. Light packing was applied. The patient's wife is a Engineer, civil (consulting)nurse and she will do the dressing changes at home. Instructions for Dressing change every other day. 5. Has appointment with ID next week. 6. Return to clinic in 2 weeks.   Keith ShellingBrent M. Xaria [name], DPM [facility]  Dr. Felecia ShellingBrent M. Mickenzie [name], DPM    [address]                                        [place], [place] [address]                Office [phone]) [phone]  Fax [phone](336) [phone]

## 2017-01-06 NOTE — Progress Notes (Signed)
01/07/2017 9:53 AM   Keith [name] 11/22/1970 [id number]  Referring provider: Care, [facility] [address] [place], [place] [address]  Chief Complaint  Patient presents with  . Urinary Retention    HPI: 46 yo WM with uncontrolled type 1 DM with urinary retention and ED who presents today for a one month follow up to review PVR's.      Background history  Consultation for urinary retention. Patient is a 46 year old white male insulin-dependent diabetic who initially presented April 4 with dysuria. He had a pansensitive staph on urine culture. UA showed many bacteria with too numerous to count white cells. He was treated with cephalexin. He presented again April 13 now with trouble urinating. Repeat urine culture was negative. A bladder scan revealed greater than a liter in the bladder and the Foley was placed draining his bladder. He was constipated. A CT scan  which was normal. Bladder was decompressed with the Foley. He was started on tamsulosin. Prostate with mild BPH on exam and CT. Prior to foley he was having to strain to void.  Neurogenic wrist includes insulin-dependent diabetes. He was not taking insulin for 6 months and began to develop complications. He has PN. He's lost a lot of weight.  BPH WITH LUTS His IPSS score today is 0, which is no lower urinary tract symptomatology. He is delighted with his quality life due to his urinary symptoms.   His previous I PSS score was 22/6.  He states that he started to urinate on his own two days after his visit with us.  He denies any dysuria, hematuria or suprapubic pain.  He currently taking tamsulosin 0.4 mg daily.  He also denies any recent fevers, chills, nausea or vomiting.  His father was diagnosed with a highly aggressive prostate cancer.      IPSS    Row Name 11/18/16 0800 01/07/17 1600       International Prostate Symptom Score   How often have you had the sensation of not emptying your bladder? About half the time Not at All     How often have you had to urinate less than every two hours? About half the time Not at All    How often have you found you stopped and started again several times when you urinated? More than half the time Not at All    How often have you found it difficult to postpone urination? Not at All Not at All    How often have you had a weak urinary stream? Almost always Not at All    How often have you had to strain to start urination? Almost always Not at All    How many times did you typically get up at night to urinate? 2 Times None    Total IPSS Score 22 0      Quality of Life due to urinary symptoms   If you were to spend the rest of your life with your urinary condition just the way it is now how would you feel about that? Terrible Delighted       Score:  1-7 Mild 8-19 Moderate 20-35 Severe   Erectile dysfunction His SHIM score is 6, which is severe ED.   His previous SHIM score was 13.  He has been having difficulty with erections for 3 to 4 years ago.   His major complaint is no erections.  His libido is diminished.   His risk factors for ED are age, DM and  HLD.  He denies any painful erections or curvatures with his erections.   He is no longer having spontaneous erections.  He has tried Viagra in the past with no success.  At his last visit we had discussed Trimix injections, but he was not waiting to inject himself.  He is wanted to try the Viagra again as his sugars are under better control       SHIM    Row Name 11/18/16 0900 01/07/17 1604       SHIM: Over the last 6 months:   How do you rate your confidence that you could get and keep an erection? Very Low Very Low    When you had erections with sexual stimulation, how often were your erections hard enough for penetration (entering your partner)? A Few Times (much less than half the time) Almost Never or Never    During sexual intercourse, how often were you able to maintain your erection after you had penetrated (entered)  your partner? A Few Times (much less than half the time) Almost Never or Never    During sexual intercourse, how difficult was it to maintain your erection to completion of intercourse? Difficult Extremely Difficult    When you attempted sexual intercourse, how often was it satisfactory for you? Almost Always or Always A Few Times (much less than half the time)      SHIM Total Score   SHIM 13 6       Score: 1-7 Severe ED 8-11 Moderate ED 12-16 Mild-Moderate ED 17-21 Mild ED 22-25 No ED  PMH: Past Medical History:  Diagnosis Date  . Diabetes mellitus without complication (HCC)   . Hyperlipidemia   . Hypertension   . Neuropathy     Surgical History: Past Surgical History:  Procedure Laterality Date  . IRRIGATION AND DEBRIDEMENT FOOT Left 04/02/2016   Procedure: IRRIGATION AND DEBRIDEMENT FOOT;  Surgeon: Gwyneth Revels, DPM;  Location: [facility] ORS;  Service: Podiatry;  Laterality: Left;    Home Medications:  Allergies as of 01/07/2017      Reactions   Daptomycin Shortness Of Breath   Breathing issues   Lisinopril    Cough      Medication List       Accurate as of 01/07/17 11:59 PM. Always use your most recent med list.          acetaminophen 500 MG tablet Commonly known as:  TYLENOL Take 500 mg by mouth every 6 (six) hours as needed.   amoxicillin-clavulanate 875-125 MG tablet Commonly known as:  AUGMENTIN Take 1 tablet by mouth 2 (two) times daily.   aspirin 81 MG tablet Take 81 mg by mouth daily.   atorvastatin 40 MG tablet Commonly known as:  LIPITOR Take 40 mg by mouth daily.   colchicine 0.6 MG tablet Take 1 tablet (0.6 mg total) by mouth daily.   colchicine 0.6 MG tablet Take 1 tablet (0.6 mg total) by mouth daily.   gabapentin 600 MG tablet Commonly known as:  NEURONTIN Take 1,800 mg by mouth 3 (three) times daily.   ibuprofen 800 MG tablet Commonly known as:  ADVIL,MOTRIN Take 1 tablet (800 mg total) by mouth every 8 (eight) hours as needed.    insulin aspart 100 UNIT/ML injection Commonly known as:  novoLOG Inject 10 Units into the skin 3 (three) times daily before meals. Per sliding scale   insulin glargine 100 UNIT/ML injection Commonly known as:  LANTUS 20 units subcutaneous injection in am 80 units subcutaneous  injection at bedtime   losartan 50 MG tablet Commonly known as:  COZAAR Take 50 mg by mouth daily.   Oxycodone HCl 10 MG Tabs Take 1 tablet (10 mg total) by mouth every 6 (six) hours as needed.   polyethylene glycol packet Commonly known as:  MIRALAX / GLYCOLAX Take 17 g by mouth daily. Mix one tablespoon with 8oz of your favorite juice or water every day until you are having soft formed stools. Then start taking once daily if you didn't have a stool the day before.   tamsulosin 0.4 MG Caps capsule Commonly known as:  FLOMAX TAKE ONE CAPSULE BY MOUTH DAILY   traMADol 50 MG tablet Commonly known as:  ULTRAM Take 1 tablet (50 mg total) by mouth every 8 (eight) hours as needed.       Allergies:  Allergies  Allergen Reactions  . Daptomycin Shortness Of Breath    Breathing issues  . Lisinopril     Cough    Family History: Family History  Problem Relation Age of Onset  . Prostate cancer Father   . Bladder Cancer Neg Hx   . Kidney cancer Neg Hx     Social History:  reports that he has never smoked. He has never used smokeless tobacco. He reports that he does not drink alcohol or use drugs.  ROS: UROLOGY Frequent Urination?: No Hard to postpone urination?: No Burning/pain with urination?: No Get up at night to urinate?: No Leakage of urine?: No Urine stream starts and stops?: No Trouble starting stream?: No Do you have to strain to urinate?: No Blood in urine?: No Urinary tract infection?: No Sexually transmitted disease?: No Injury to kidneys or bladder?: No Painful intercourse?: No Weak stream?: No Erection problems?: No Penile pain?: No  Gastrointestinal Nausea?: No Vomiting?:  No Indigestion/heartburn?: No Diarrhea?: No Constipation?: No  Constitutional Fever: No Night sweats?: No Weight loss?: No Fatigue?: No  Skin Skin rash/lesions?: No Itching?: No  Eyes Blurred vision?: No Double vision?: No  Ears/Nose/Throat Sore throat?: No Sinus problems?: No  Hematologic/Lymphatic Swollen glands?: No Easy bruising?: No  Cardiovascular Leg swelling?: No Chest pain?: No  Respiratory Cough?: No Shortness of breath?: No  Endocrine Excessive thirst?: No  Musculoskeletal Back pain?: No Joint pain?: No  Neurological Headaches?: No Dizziness?: No  Psychologic Depression?: No Anxiety?: No  Physical Exam: BP 104/69   Pulse 86   Ht 6' (1.829 m)   Wt 226 lb 12.8 oz (102.9 kg)   BMI 30.76 kg/m   Constitutional:  Alert and oriented, No acute distress. HEENT: [place] AT, moist mucus membranes.  Trachea midline, no masses. Cardiovascular: No clubbing, cyanosis, or edema. Respiratory: Normal respiratory effort, no increased work of breathing. Skin: No rashes, bruises or suspicious lesions. Lymph: No cervical or inguinal adenopathy. Neurologic: Grossly intact, no focal deficits, moving all 4 extremities. Psychiatric: Normal mood and affect.  Laboratory Data: Lab Results  Component Value Date   WBC 6.6 11/28/2016   HGB 8.7 (L) 11/28/2016   HCT 26.5 (L) 11/28/2016   MCV 78.0 (L) 11/28/2016   PLT 379 11/28/2016    Lab Results  Component Value Date   CREATININE 1.47 (H) 11/28/2016     Lab Results  Component Value Date   HGBA1C 13.9 (H) 04/01/2016     Assessment & Plan:    1.  Urinary retention  - resolved with better control of his diabetes  2. Erectile dysfunction  - SHIM score is 6  - patient given Viagra samples  to take  - he will call back with the results   Return for patient to call back for appointment.  Michiel Cowboy, PA-C  [facility] [address] [place], [place]  [address] [phone]

## 2017-01-07 ENCOUNTER — Ambulatory Visit (INDEPENDENT_AMBULATORY_CARE_PROVIDER_SITE_OTHER): Payer: BC Managed Care – PPO | Admitting: Urology

## 2017-01-07 ENCOUNTER — Encounter: Payer: Self-pay | Admitting: Urology

## 2017-01-07 VITALS — BP 104/69 | HR 86 | Ht 72.0 in | Wt 226.8 lb

## 2017-01-07 DIAGNOSIS — R339 Retention of urine, unspecified: Secondary | ICD-10-CM

## 2017-01-07 DIAGNOSIS — N529 Male erectile dysfunction, unspecified: Secondary | ICD-10-CM

## 2017-01-07 LAB — BLADDER SCAN AMB NON-IMAGING: Scan Result: 0

## 2017-01-09 ENCOUNTER — Ambulatory Visit (INDEPENDENT_AMBULATORY_CARE_PROVIDER_SITE_OTHER): Payer: BC Managed Care – PPO | Admitting: Internal Medicine

## 2017-01-09 ENCOUNTER — Encounter: Payer: Self-pay | Admitting: Podiatry

## 2017-01-09 ENCOUNTER — Encounter: Payer: Self-pay | Admitting: Internal Medicine

## 2017-01-09 ENCOUNTER — Ambulatory Visit (INDEPENDENT_AMBULATORY_CARE_PROVIDER_SITE_OTHER): Payer: Self-pay | Admitting: Podiatry

## 2017-01-09 DIAGNOSIS — G629 Polyneuropathy, unspecified: Secondary | ICD-10-CM

## 2017-01-09 DIAGNOSIS — M71071 Abscess of bursa, right ankle and foot: Secondary | ICD-10-CM

## 2017-01-09 DIAGNOSIS — N400 Enlarged prostate without lower urinary tract symptoms: Secondary | ICD-10-CM | POA: Diagnosis not present

## 2017-01-09 DIAGNOSIS — M009 Pyogenic arthritis, unspecified: Secondary | ICD-10-CM | POA: Insufficient documentation

## 2017-01-09 DIAGNOSIS — I10 Essential (primary) hypertension: Secondary | ICD-10-CM

## 2017-01-09 DIAGNOSIS — E0842 Diabetes mellitus due to underlying condition with diabetic polyneuropathy: Secondary | ICD-10-CM

## 2017-01-09 DIAGNOSIS — R600 Localized edema: Secondary | ICD-10-CM

## 2017-01-09 DIAGNOSIS — L03115 Cellulitis of right lower limb: Secondary | ICD-10-CM

## 2017-01-09 DIAGNOSIS — E119 Type 2 diabetes mellitus without complications: Secondary | ICD-10-CM | POA: Insufficient documentation

## 2017-01-09 LAB — BASIC METABOLIC PANEL
BUN: 17 mg/dL (ref 7–25)
CO2: 23 mmol/L (ref 20–31)
CREATININE: 1.04 mg/dL (ref 0.60–1.35)
Calcium: 9 mg/dL (ref 8.6–10.3)
Chloride: 106 mmol/L (ref 98–110)
Glucose, Bld: 135 mg/dL — ABNORMAL HIGH (ref 65–99)
POTASSIUM: 4.6 mmol/L (ref 3.5–5.3)
Sodium: 140 mmol/L (ref 135–146)

## 2017-01-09 LAB — CBC
HEMATOCRIT: 27.8 % — AB (ref 38.5–50.0)
Hemoglobin: 8.3 g/dL — ABNORMAL LOW (ref 13.2–17.1)
MCH: 23.1 pg — ABNORMAL LOW (ref 27.0–33.0)
MCHC: 29.9 g/dL — AB (ref 32.0–36.0)
MCV: 77.2 fL — ABNORMAL LOW (ref 80.0–100.0)
MPV: 9.9 fL (ref 7.5–12.5)
Platelets: 348 10*3/uL (ref 140–400)
RBC: 3.6 MIL/uL — ABNORMAL LOW (ref 4.20–5.80)
RDW: 16.4 % — AB (ref 11.0–15.0)
WBC: 6.5 10*3/uL (ref 3.8–10.8)

## 2017-01-09 MED ORDER — CEFAZOLIN SODIUM 1 G IV SOLR: 2.0000 g | Freq: Three times a day (TID) | INTRAVENOUS | Status: DC

## 2017-01-09 NOTE — Progress Notes (Signed)
   Subjective:  Patient presents today status post incision and drainage of an abscess to the right ankle. DOS: 12/25/16. Patient saw the infectious disease physician this morning and he is going to initiate a PICC line for IV antibiotics due to septic ankle joint right. Patient states that overall his ulcer is improving greatly and he feels much better healthwise   Objective/Physical Exam There is a large amount of dehiscence noted to the incision site medial ankle right. Dehiscence measures approximately 3 cm in length 1.0 cm in width 1.0 cm in depth. There is a central portion of this dehiscence which is held together with 3 stainless steel skin staples. These will likely dehisced as well at which time we will initiate wound VAC therapy. Overall the ulcer appears stable and the erythema around the ankle joint appears to be improved  Assessment: 1. s/p incision and drainage of right ankle DOS: 12/25/16 2. Diabetes mellitus with polyneuropathy 3. Septic ankle joint right with peri-incisional and soft tissue cellulitis  Plan of Care:  1. Patient was evaluated. 2. Continue taking Augmentin until infectious disease initiates PICC line. 3. Today were going to request authorization for negative pressure wound VAC therapy for the right medial ankle ulceration 4. Continue daily dressing changes until he can initiate wound VAC 5. Continue ambulating in postoperative shoe 6. Return to clinic in 2 weeks  Felecia ShellingBrent M. Siriah Treat, DPM [facility]  Dr. Felecia ShellingBrent M. Shelena Castelluccio, DPM    [address]                                        [place], [place] [address]                Office [phone]  Fax [phone]

## 2017-01-09 NOTE — Progress Notes (Signed)
[facility] for Infectious Disease  Reason for Consult: MSSA septic arthritis of right ankle with possible osteomyelitis Referring Physician: Dr. Daylene Katayama  Assessment: He has septic arthritis of his right ankle and possible early osteomyelitis due to MSSA. He is a little better after surgery and 2 weeks of amoxicillin but I would recommend that we switch him to IV cefazolin and plan on 4-6 weeks of therapy. He is in agreement with that plan.   Plan: 1. PICC placement 2. Change amoxicillin to IV cefazolin 2 g every 8 hours (or renally dosed if his creatinine remains elevated) 3. Check CBC, BMP, ESR and CRP weekly 4. Follow-up here in 4 weeks   Patient Active Problem List   Diagnosis Date Noted  . Septic arthritis of ankle or foot ([place]) 01/09/2017    Priority: High  . Osteomyelitis of ankle, right, acute ([place]) 11/27/2016    Priority: High  . DM (diabetes mellitus) ([place]) 01/09/2017  . HTN (hypertension) 01/09/2017  . Neuropathy 01/09/2017  . BPH (benign prostatic hyperplasia) 01/09/2017  . Diabetic foot ulcer ([place]) 03/31/2016    Patient's Medications  New Prescriptions   No medications on file  Previous Medications   ACETAMINOPHEN (TYLENOL) 500 MG TABLET    Take 500 mg by mouth every 6 (six) hours as needed.   AMOXICILLIN-CLAVULANATE (AUGMENTIN) 875-125 MG TABLET    Take 1 tablet by mouth 2 (two) times daily.   ASPIRIN 81 MG TABLET    Take 81 mg by mouth daily.   ATORVASTATIN (LIPITOR) 40 MG TABLET    Take 40 mg by mouth daily.   COLCHICINE 0.6 MG TABLET    Take 1 tablet (0.6 mg total) by mouth daily.   COLCHICINE 0.6 MG TABLET    Take 1 tablet (0.6 mg total) by mouth daily.   GABAPENTIN (NEURONTIN) 600 MG TABLET    Take 1,800 mg by mouth 3 (three) times daily.    IBUPROFEN (ADVIL,MOTRIN) 800 MG TABLET    Take 1 tablet (800 mg total) by mouth every 8 (eight) hours as needed.   INSULIN ASPART (NOVOLOG) 100 UNIT/ML INJECTION    Inject 10 Units into the skin 3  (three) times daily before meals. Per sliding scale   INSULIN GLARGINE (LANTUS) 100 UNIT/ML INJECTION    20 units subcutaneous injection in am 80 units subcutaneous injection at bedtime   LOSARTAN (COZAAR) 50 MG TABLET    Take 50 mg by mouth daily.   OXYCODONE HCL 10 MG TABS    Take 1 tablet (10 mg total) by mouth every 6 (six) hours as needed.   POLYETHYLENE GLYCOL (MIRALAX / GLYCOLAX) PACKET    Take 17 g by mouth daily. Mix one tablespoon with 8oz of your favorite juice or water every day until you are having soft formed stools. Then start taking once daily if you didn't have a stool the day before.   TAMSULOSIN (FLOMAX) 0.4 MG CAPS CAPSULE    TAKE ONE CAPSULE BY MOUTH DAILY   TRAMADOL (ULTRAM) 50 MG TABLET    Take 1 tablet (50 mg total) by mouth every 8 (eight) hours as needed.  Modified Medications   No medications on file  Discontinued Medications   No medications on file    HPI: Keith [name] is a 46 y.o. male with diabetes and peripheral neuropathy who is had previous bilateral diabetic foot infections. About 6 weeks ago he began to develop pain, redness and swelling around his  medial right ankle. He was treated with empiric trimethoprim sulfamethoxazole but did not have any improvement. He was hospitalized at [facility] on 11/27/2016. He was evaluated by orthopedics there did not feel that the right ankle was infected. Based on exam and x-rays they felt that he might have early avascular necrosis or early Charcot joint changes. He was discharged the following day. An MRI of his right ankle showed marrow edema in the bones of his forefoot and calcaneus. He was evaluated by Dr. Amalia Hailey on 12/02/2016 and treated for possible gout with a steroid injection followed by culture seen. His serum uric acid was normal.  He continued to have worsening pain and redness and swelling that extended up to his knee. On 12/25/2016 he underwent surgery. Pus was encountered in the joint.  Operative cultures grew MSSA. He has been on amoxicillin since surgery. The pain, redness and swelling has improved slightly but he continues to have drainage from his surgical incision. 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 He states that he quit taking his insulin for 6 months last year and his hemoglobin A1c went up to over 13. He is now back on his insulin and his latest A1c was down to around 8. He has had PICC lines for diabetic foot infections on 2 previous occasions. His wife is a Marine scientist and knows how to administer IV antibiotics at home.  Review of Systems: Review of Systems  Constitutional: Positive for malaise/fatigue. Negative for chills, diaphoresis, fever and weight loss.  HENT: Negative for congestion and sore throat.   Respiratory: Negative for cough and sputum production.   Cardiovascular: Negative for chest pain.  Gastrointestinal: Negative for abdominal pain, diarrhea, nausea and vomiting.  Genitourinary: Negative for dysuria.  Musculoskeletal: Positive for joint pain. Negative for myalgias.  Skin: Negative for rash.  Neurological: Negative for dizziness and headaches.  Psychiatric/Behavioral: Negative for depression and substance abuse. The patient is not nervous/anxious.     . betamethasone acetate-betamethasone sodium phosphate  3 mg Intramuscular Once    Past Medical History:  Diagnosis Date  . Diabetes mellitus without complication ([place])   . Hyperlipidemia   . Hypertension   . Neuropathy     Social History  Substance Use Topics  . Smoking status: Never Smoker  . Smokeless tobacco: Never Used  . Alcohol use No    Family History  Problem Relation Age of Onset  . Prostate cancer Father   . Bladder Cancer Neg Hx   . Kidney cancer Neg Hx    Allergies  Allergen Reactions  . Daptomycin Shortness Of Breath    Breathing issues  . Lisinopril     Cough    OBJECTIVE: Vitals:   01/09/17 0839  BP: 118/77  Pulse: 78    Temp: 98.6 F (37 C)  TempSrc: Oral  Weight: 228 lb (103.4 kg)  Height: 6' (1.829 m)   Body mass index is 30.92 kg/m.   Physical Exam  Constitutional: He is oriented to person, place, and time.  He is alert and in no distress. He is in reasonably good spirits.  Cardiovascular: Normal rate and regular rhythm.   No murmur heard. Pulmonary/Chest: Effort normal and breath sounds normal.  Abdominal: Soft. There is no tenderness.  Musculoskeletal:  He has sutures in his right medial ankle surgical incision. There is thin bloody drainage he has swelling, erythema and warmth around the ankle. He has thickened callus under the first metatarsal  head on his right foot without obvious infection.  Neurological: He is alert and oriented to person, place, and time.  Skin: No rash noted.  Psychiatric: Mood and affect normal.    Microbiology: No results found for this or any previous visit (from the past 240 hour(s)).  Michel Bickers, MD [facility] [place] [facility] [phone] pager   [phone] cell 01/09/2017, 9:00 AM

## 2017-01-09 NOTE — Progress Notes (Signed)
DOS 06.14.2018 Incision and drainage right ankle.

## 2017-01-10 LAB — C-REACTIVE PROTEIN: CRP: 27.3 mg/L — ABNORMAL HIGH (ref <8.0)

## 2017-01-10 LAB — SEDIMENTATION RATE: Sed Rate: 71 mm/hr — ABNORMAL HIGH (ref 0–15)

## 2017-01-12 ENCOUNTER — Other Ambulatory Visit: Payer: Self-pay | Admitting: Internal Medicine

## 2017-01-12 DIAGNOSIS — M009 Pyogenic arthritis, unspecified: Secondary | ICD-10-CM

## 2017-01-13 ENCOUNTER — Ambulatory Visit (HOSPITAL_COMMUNITY)
Admission: RE | Admit: 2017-01-13 | Discharge: 2017-01-13 | Disposition: A | Discharge status: Home / Self Care | Payer: BC Managed Care – PPO | Source: Ambulatory Visit | Attending: Internal Medicine | Admitting: Internal Medicine

## 2017-01-13 ENCOUNTER — Encounter (HOSPITAL_COMMUNITY): Payer: Self-pay | Admitting: Radiology

## 2017-01-13 ENCOUNTER — Other Ambulatory Visit: Payer: Self-pay | Admitting: Internal Medicine

## 2017-01-13 ENCOUNTER — Encounter (HOSPITAL_COMMUNITY)
Admission: RE | Admit: 2017-01-13 | Discharge: 2017-01-13 | Disposition: A | Discharge status: Home / Self Care | Payer: BC Managed Care – PPO | Source: Ambulatory Visit | Attending: Internal Medicine | Admitting: Internal Medicine

## 2017-01-13 ENCOUNTER — Telehealth: Payer: Self-pay | Admitting: Podiatry

## 2017-01-13 ENCOUNTER — Other Ambulatory Visit (HOSPITAL_COMMUNITY): Payer: Self-pay | Admitting: *Deleted

## 2017-01-13 DIAGNOSIS — B9561 Methicillin susceptible Staphylococcus aureus infection as the cause of diseases classified elsewhere: Secondary | ICD-10-CM | POA: Insufficient documentation

## 2017-01-13 DIAGNOSIS — N400 Enlarged prostate without lower urinary tract symptoms: Secondary | ICD-10-CM | POA: Insufficient documentation

## 2017-01-13 DIAGNOSIS — M009 Pyogenic arthritis, unspecified: Secondary | ICD-10-CM | POA: Insufficient documentation

## 2017-01-13 DIAGNOSIS — Z794 Long term (current) use of insulin: Secondary | ICD-10-CM | POA: Diagnosis not present

## 2017-01-13 DIAGNOSIS — E1142 Type 2 diabetes mellitus with diabetic polyneuropathy: Secondary | ICD-10-CM | POA: Insufficient documentation

## 2017-01-13 DIAGNOSIS — Z79899 Other long term (current) drug therapy: Secondary | ICD-10-CM | POA: Diagnosis not present

## 2017-01-13 DIAGNOSIS — I10 Essential (primary) hypertension: Secondary | ICD-10-CM | POA: Insufficient documentation

## 2017-01-13 HISTORY — PX: IR FLUORO GUIDE CV LINE RIGHT: IMG2283

## 2017-01-13 HISTORY — PX: IR US GUIDE VASC ACCESS RIGHT: IMG2390

## 2017-01-13 MED ORDER — CEFAZOLIN SODIUM-DEXTROSE 2-4 GM/100ML-% IV SOLN
2.0000 g | Freq: Once | INTRAVENOUS | Status: AC
  Administered 2017-01-13: 2 g via INTRAVENOUS
  Filled 2017-01-13: qty 100

## 2017-01-13 MED ORDER — HEPARIN SOD (PORK) LOCK FLUSH 100 UNIT/ML IV SOLN
250.0000 [IU] | INTRAVENOUS | Status: AC | PRN
  Administered 2017-01-13: 250 [IU]

## 2017-01-13 MED ORDER — LIDOCAINE HCL 1 % IJ SOLN
INTRAMUSCULAR | Status: DC | PRN
  Administered 2017-01-13: 5 mL

## 2017-01-13 MED ORDER — HEPARIN SOD (PORK) LOCK FLUSH 100 UNIT/ML IV SOLN
INTRAVENOUS | Status: AC
  Administered 2017-01-13: 11:00:00 250 [IU]
  Filled 2017-01-13: qty 5

## 2017-01-13 MED ORDER — LIDOCAINE HCL (PF) 1 % IJ SOLN
INTRAMUSCULAR | Status: AC
  Filled 2017-01-13: qty 30

## 2017-01-13 NOTE — Telephone Encounter (Signed)
[facility] but she is out for the rest of this week. I'm sending this message to Angie in our [place] office and have her follow up with it.  Thanks, Dr. Logan BoresEvans

## 2017-01-13 NOTE — Telephone Encounter (Signed)
Pt called checking on status of wound vac that you had told him you were ordering last week. Please call pt with information

## 2017-01-16 ENCOUNTER — Telehealth: Payer: Self-pay | Admitting: Podiatry

## 2017-01-16 ENCOUNTER — Encounter: Payer: Self-pay | Admitting: Podiatry

## 2017-01-16 ENCOUNTER — Ambulatory Visit (INDEPENDENT_AMBULATORY_CARE_PROVIDER_SITE_OTHER): Payer: BC Managed Care – PPO | Admitting: Podiatry

## 2017-01-16 DIAGNOSIS — L97312 Non-pressure chronic ulcer of right ankle with fat layer exposed: Secondary | ICD-10-CM

## 2017-01-16 DIAGNOSIS — I70235 Atherosclerosis of native arteries of right leg with ulceration of other part of foot: Secondary | ICD-10-CM

## 2017-01-16 DIAGNOSIS — E0842 Diabetes mellitus due to underlying condition with diabetic polyneuropathy: Secondary | ICD-10-CM

## 2017-01-16 NOTE — Telephone Encounter (Signed)
[facility] to [facility]

## 2017-01-16 NOTE — Telephone Encounter (Signed)
Hello, this is Dianna calling from [facility]. We received an order on Keith [name] DOB 1971-02-05. We need an H&P and any clinical notes faxed to [phone] to further process the order for review. If you have any questions, you can call me directly at [phone].

## 2017-01-16 NOTE — Telephone Encounter (Signed)
[facility]

## 2017-01-16 NOTE — Telephone Encounter (Signed)
Office notes have been faxed to [facility] 

## 2017-01-19 ENCOUNTER — Other Ambulatory Visit: Payer: Self-pay

## 2017-01-19 ENCOUNTER — Telehealth: Payer: Self-pay | Admitting: *Deleted

## 2017-01-19 NOTE — Telephone Encounter (Addendum)
-----   Message from Felecia ShellingBrent M Evans, DPM sent at 01/09/2017  6:04 PM EDT ----- Regarding: Wound VAC Ordered for Negative pressure wound VAC right ankle ulcer Dx: Right ankle ulceration secondary to diabetes mellitus. Septic right ankle joint negative for osteomyelitis being treated by infectious disease  Note dictated. Thanks, Dr. Logan BoresEvans. 01/19/2017-Faxed required form to [facility], clinicals and demographics.Dawn states A. Glynda [facility]egerVenable - [facility] had sent paperwork for [facility], and pt should receive the wound vac today.

## 2017-01-20 NOTE — Progress Notes (Signed)
   Subjective:  Patient presents today status post incision and drainage of an abscess to the right ankle. DOS: 12/25/16. Patient states his condition is unchanged from last visit. He does report a new problem of a callus to the left plantar forefoot. He denies any pain to the area.    Objective/Physical Exam There is a large amount of dehiscence noted to the incision site medial ankle right. Dehiscence measures approximately 3 cm in length 1.0 cm in width 1.0 cm in depth. There is a central portion of this dehiscence which is held together with 3 stainless steel skin staples. These will likely dehisced as well at which time we will initiate wound VAC therapy. Overall the ulcer appears stable and the erythema around the ankle joint appears to be improved  Assessment: 1. s/p incision and drainage of right ankle DOS: 12/25/16 2. Ulcer right medial ankle secondary to DM  Plan of Care:  1. Patient was evaluated. 2. medically necessary excisional debridement including muscle and deep fascial tissue was performed using a tissue nipper and a chisel blade. Excisional debridement of all the necrotic nonviable tissue down to healthy bleeding viable tissue was performed with post-debridement measurements same as pre-. 3. The wound was cleansed and dry sterile dressing applied. 4. Saline wet-to-dry dressings applied. 5. Office still working on authorization for wound vac. 6. Return to clinic in 1 week.   Felecia ShellingBrent M. Evans, DPM [facility]  Dr. Felecia ShellingBrent M. Evans, DPM    [address]                                        [place], [place] [address]                Office [phone]  Fax [phone]

## 2017-01-21 NOTE — Telephone Encounter (Signed)
I am calling in regards to Keith [name]. He told us that he had a wound vac delivered but we do not have orders for that. Can you please call me and give me verbal orders. You can reach me at [phone].

## 2017-01-21 NOTE — Telephone Encounter (Signed)
Verbal order has been given to [name] for wound vac

## 2017-01-23 ENCOUNTER — Encounter: Payer: Self-pay | Admitting: Podiatry

## 2017-01-23 ENCOUNTER — Ambulatory Visit (INDEPENDENT_AMBULATORY_CARE_PROVIDER_SITE_OTHER): Payer: BC Managed Care – PPO | Admitting: Podiatry

## 2017-01-23 DIAGNOSIS — L97312 Non-pressure chronic ulcer of right ankle with fat layer exposed: Secondary | ICD-10-CM | POA: Diagnosis not present

## 2017-01-23 DIAGNOSIS — I70235 Atherosclerosis of native arteries of right leg with ulceration of other part of foot: Secondary | ICD-10-CM | POA: Diagnosis not present

## 2017-01-23 DIAGNOSIS — E0842 Diabetes mellitus due to underlying condition with diabetic polyneuropathy: Secondary | ICD-10-CM

## 2017-01-27 NOTE — Progress Notes (Signed)
   Subjective:  Patient presents today status post incision and drainage of an abscess to the right ankle. DOS: 12/25/16. Patient states that he is doing much better and he experiences a decrease in pain.  We finally got the negative pressure wound VAC authorized and the patient states that it was placed yesterday and applied to his ankle. He denies any drainage and he says the wound VAC is working fine.  Objective/Physical Exam The negative pressure wound VAC appears to be intact and working properly. Patient is currently on IV antibiotics. PICC line intact.   Assessment: 1. s/p incision and drainage of right ankle DOS: 12/25/16 2. Ulcer right medial ankle secondary to DM  Plan of Care:  1. Patient was evaluated. 2. after evaluation we decided to leave the wound VAC intact. Since it was just applied yesterday were going to leave it and let the nurse change the dressings at home.   3. Continue IV antibiotics via infectious disease  4. Return to clinic in 2 weeks  Keith [name], DPM [facility]  Dr. [name], DPM    [address]                                        [place], [place] [address]                Office [phone]  Fax [phone]

## 2017-01-29 ENCOUNTER — Telehealth: Payer: Self-pay

## 2017-01-29 NOTE — Telephone Encounter (Signed)
Patient called the office stating that the lateral side of his right foot is very painful, tender to touch, swollen x 4 days.  He states that it is warm to touch and has slight redness covering the entire lateral side of foot.  He reports that the wound vac on the medial right foot is doing great.  He denies any fever,chills.  I offered him an appointment for tomorrow, but he declined and wanted me to talk to Dr. Logan BoresEvans first.  I informed him to keep foot elevated and iced, monitor for any worsening or fever and go to ER if becomes worse.  He verbally understood and I will contact patient in the morning.

## 2017-01-30 ENCOUNTER — Encounter: Payer: BC Managed Care – PPO | Admitting: Podiatry

## 2017-01-30 ENCOUNTER — Telehealth: Payer: Self-pay

## 2017-01-30 NOTE — Telephone Encounter (Signed)
I spoke with Dr. Logan BoresEvans, he states that If patient feels his foot is becoming infected, he will need to see the infectious disease doctor.  He will keep his appt for 02/06/17.    Patient was called and informed of Dr. Logan BoresEvans instructions and verbally understood.

## 2017-02-03 ENCOUNTER — Other Ambulatory Visit: Payer: Self-pay | Admitting: Pharmacist

## 2017-02-06 ENCOUNTER — Ambulatory Visit (INDEPENDENT_AMBULATORY_CARE_PROVIDER_SITE_OTHER): Payer: BC Managed Care – PPO | Admitting: Podiatry

## 2017-02-06 ENCOUNTER — Encounter: Payer: Self-pay | Admitting: Podiatry

## 2017-02-06 VITALS — Temp 97.1°F

## 2017-02-06 DIAGNOSIS — I70235 Atherosclerosis of native arteries of right leg with ulceration of other part of foot: Secondary | ICD-10-CM

## 2017-02-06 DIAGNOSIS — E0842 Diabetes mellitus due to underlying condition with diabetic polyneuropathy: Secondary | ICD-10-CM

## 2017-02-06 DIAGNOSIS — L97312 Non-pressure chronic ulcer of right ankle with fat layer exposed: Secondary | ICD-10-CM

## 2017-02-08 NOTE — Progress Notes (Signed)
   Subjective:  Patient presents today status post incision and drainage of an abscess to the right ankle. DOS: 12/25/16. Patient states that he is doing much better and he experiences a decrease in pain. He does have a new complaint of some mild swelling to the right ankle that he has experienced of the past week. Patient states that there is no pain associated with the swelling however. He believes overall is doing very well. He is continuing to receive IV antibiotics through PICC line.  Objective/Physical Exam Wound #1 located to the medial aspect of the right ankle measuring approximately 1.52.50.2 cm. To the noted ulceration there is no eschar. There is a moderate amount of slough fibrin and necrotic tissue noted. Granulation wound base is red. There is no exposed bone muscle-tendon ligament or joint. There is a minimal amount of serosanguineous drainage noted. No malodor. Periwound integrity is intact.  Assessment: 1. s/p incision and drainage of right ankle DOS: 12/25/16 2. Ulcer right medial ankle secondary to DM  Plan of Care:  1. Patient was evaluated. 2. Negative pressure wound VAC was reapplied today to the ulceration site after excisional debridement 3. Medically necessary excisional debridement including subcutaneous tissues performed using a tissue nipper. Excisional debridement of all necrotic nonviable tissue down to healthy bleeding viable tissue was performed with post-debridement measurements and was pre- 4. Continue IV antibiotics through PICC line as managed by infectious disease 5. Today the patient should likely be ready for application of Integra primatrix dermal scaffold grafting next visit. Authorization for primatrix initiated today. 6. Return to clinic in 2 weeks for possible matrix dermal scaffold application  Felecia ShellingBrent M. Madden Garron, DPM [facility]  Dr. Felecia ShellingBrent M. Deliliah Spranger, DPM    [address]                                        [place], [place]  [id number]                Office [phone]  Fax [phone]

## 2017-02-09 ENCOUNTER — Telehealth: Payer: Self-pay | Admitting: *Deleted

## 2017-02-09 ENCOUNTER — Encounter: Payer: Self-pay | Admitting: Internal Medicine

## 2017-02-09 NOTE — Telephone Encounter (Addendum)
Keith [name] - [employer] states she needs wound measurement from between 07/11 - 02/05/2017 but not beyond. Routed message to A. Venable.02/10/2017-[employer] - Reimbursement-Patient Risk analystnsurance [place] Verification Results states pt's insurance does not cover PriMatrix.02/11/2017-Faxed required form, clinicals and demographics to [employer].02/16/2017-Keith [name] - [employer] states pt is covered may have $85.00 co-pay. Keith RammingV. Hill reviewed Primary Patient Benefits statement states pt may pay between 0 - $85.00 co-pay. I informed pt that our insurance reviewer states he may pay between 0 - $85.00 co-pay per each graft. Pt states his wound is healing so well, he may not need it, but he would like to begin the grafts if necessary after being evaluated 02/24/2017.

## 2017-02-09 NOTE — Telephone Encounter (Addendum)
-----   Message from Felecia ShellingBrent M Evans, DPM sent at 02/08/2017  9:31 AM EDT ----- Regarding: Integra Primatrix  Please see if we can approve the patient for Integra primatrix. Patient has a return appointment in approximately 2 weeks.  Diagnosis: Ulcer right ankle secondary to diabetes mellitus  Thanks, Dr. Logan BoresEvans.02/09/2017-Faxed required form, clinicals, and demographics to [employer].

## 2017-02-10 NOTE — Telephone Encounter (Signed)
What about Epifix???

## 2017-02-10 NOTE — Telephone Encounter (Signed)
I returned call to [facility] with [employer] and gave her wound measurements that she requested

## 2017-02-13 ENCOUNTER — Telehealth: Payer: Self-pay

## 2017-02-13 MED ORDER — TRAMADOL HCL 50 MG PO TABS: 50.0000 mg | ORAL_TABLET | Freq: Three times a day (TID) | ORAL | 0 refills | Status: DC | PRN

## 2017-02-13 NOTE — Telephone Encounter (Signed)
Patient called requesting refill of Tramadol to take mainly at night.  Per Dr. Logan BoresEvans, ok to refill Tramadol #30 Rx has been printed and patient will pick up at front desk

## 2017-02-16 ENCOUNTER — Encounter: Payer: Self-pay | Admitting: Internal Medicine

## 2017-02-16 DIAGNOSIS — H2513 Age-related nuclear cataract, bilateral: Secondary | ICD-10-CM | POA: Insufficient documentation

## 2017-02-16 DIAGNOSIS — E103392 Type 1 diabetes mellitus with moderate nonproliferative diabetic retinopathy without macular edema, left eye: Secondary | ICD-10-CM | POA: Insufficient documentation

## 2017-02-16 DIAGNOSIS — H4720 Unspecified optic atrophy: Secondary | ICD-10-CM | POA: Insufficient documentation

## 2017-02-16 DIAGNOSIS — E103591 Type 1 diabetes mellitus with proliferative diabetic retinopathy without macular edema, right eye: Secondary | ICD-10-CM | POA: Insufficient documentation

## 2017-02-17 ENCOUNTER — Other Ambulatory Visit: Payer: Self-pay | Admitting: Pharmacist

## 2017-02-18 ENCOUNTER — Other Ambulatory Visit: Payer: Self-pay | Admitting: Pharmacist

## 2017-02-19 ENCOUNTER — Ambulatory Visit (INDEPENDENT_AMBULATORY_CARE_PROVIDER_SITE_OTHER): Payer: BC Managed Care – PPO | Admitting: Internal Medicine

## 2017-02-19 ENCOUNTER — Telehealth: Payer: Self-pay | Admitting: Pharmacist

## 2017-02-19 DIAGNOSIS — M009 Pyogenic arthritis, unspecified: Secondary | ICD-10-CM | POA: Diagnosis not present

## 2017-02-19 MED ORDER — CEFAZOLIN SODIUM 1 G IV SOLR: 2.0000 g | Freq: Three times a day (TID) | INTRAVENOUS | Status: AC

## 2017-02-19 NOTE — Assessment & Plan Note (Signed)
He is improving clinically on therapy for septic arthritis and possible early osteomyelitis of his right ankle. His inflammatory markers are declining. I will have him complete IV antibiotic therapy on 02/24/2017 then stop cefazolin and have the PICC removed. He will follow-up with me in 4 weeks.

## 2017-02-19 NOTE — Progress Notes (Signed)
[facility]  Patient Active Problem List   Diagnosis Date Noted  . 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 Osteomyelitis of ankle, right, acute (HCC) 11/27/2016    Priority: High  . Type II diabetes mellitus (HCC) 01/09/2017  . HTN (hypertension) 01/09/2017  . Neuropathy 01/09/2017  . BPH (benign prostatic hyperplasia) 01/09/2017  . Anemia 12/26/2016  . Abnormal MRI scan, bone 12/22/2016  . Acute hematogenous osteomyelitis of right ankle (HCC) 12/22/2016  . Chronic foot pain, right 12/22/2016  . Elevated rheumatoid factor 12/22/2016  . Diabetic ulcer of right foot associated with diabetes mellitus due to underlying condition (HCC) 03/31/2016  . Chronic osteomyelitis of right foot (HCC) 05/22/2015  . Abscess of foot 04/27/2015  . Cellulitis of lower extremity 04/27/2015  . LPRD (laryngopharyngeal reflux disease) 09/19/2014  . Pneumonia 08/23/2014  . Hypercholesterolemia 08/01/2014  . Diabetic polyneuropathy associated with type 1 diabetes mellitus (HCC) 07/24/2014    Patient's Medications  New Prescriptions   No medications on file  Previous Medications   ACETAMINOPHEN (TYLENOL) 500 MG TABLET    Take 500 mg by mouth every 6 (six) hours as needed.   ASPIRIN 81 MG TABLET    Take 81 mg by mouth daily.   ATORVASTATIN (LIPITOR) 40 MG TABLET    Take 40 mg by mouth daily.   COLCHICINE 0.6 MG TABLET    Take 1 tablet (0.6 mg total) by mouth daily.   FERROUS SULFATE 325 (65 FE) MG TABLET    Take 325 mg by mouth.   GABAPENTIN (NEURONTIN) 600 MG TABLET    Take 1,800 mg by mouth 3 (three) times daily.    IBUPROFEN (ADVIL,MOTRIN) 800 MG TABLET    Take 1 tablet (800 mg total) by mouth every 8 (eight) hours as needed.   INSULIN ASPART (NOVOLOG) 100 UNIT/ML INJECTION    Inject 10 Units into the skin 3 (three) times daily before meals. Per sliding scale   INSULIN GLARGINE (BASAGLAR KWIKPEN) 100 UNIT/ML SOPN       INSULIN  GLARGINE (LANTUS) 100 UNIT/ML INJECTION    20 units subcutaneous injection in am 80 units subcutaneous injection at bedtime   LOSARTAN (COZAAR) 50 MG TABLET    Take 50 mg by mouth daily.   ONETOUCH DELICA LANCETS 33G MISC       PAROXETINE (PAXIL) 20 MG TABLET       POLYETHYLENE GLYCOL (MIRALAX / GLYCOLAX) PACKET    Take 17 g by mouth daily. Mix one tablespoon with 8oz of your favorite juice or water every day until you are having soft formed stools. Then start taking once daily if you didn't have a stool the day before.   TAMSULOSIN (FLOMAX) 0.4 MG CAPS CAPSULE    TAKE ONE CAPSULE BY MOUTH DAILY   TRAMADOL (ULTRAM) 50 MG TABLET    Take 1 tablet (50 mg total) by mouth every 8 (eight) hours as needed.  Modified Medications   Modified Medication Previous Medication   CEFAZOLIN (ANCEF) 1 G INJECTION ceFAZolin (ANCEF) 1 g injection      Inject 2,000 mg (2 g total) into the vein every 8 (eight) hours.    Inject 2,000 mg (2 g total) into the vein every 8 (eight) hours.  Discontinued Medications   No medications on file    Subjective: Mr. Keith [name] is in for his routine follow-up visit. He has diabetes and developed septic arthritis  of his right ankle. He underwent surgery on 12/25/2016. Operative cultures grew MSSA. He was on amoxicillin postoperatively until he saw me on 01/09/2017. I switched him to IV cefazolin which he started on 01/13/2017. He is now completed 37 days of IV cefazolin therapy. He's had no problems tolerating cefazolin or his right arm PICC. He is feeling much better. He is no longer having pain in his right ankle. His appetite is improved and he is gaining weight. His blood sugars are under much better control. His wound is getting smaller. His VAC wound dressing is being changed 3 times a week area  Review of Systems: Review of Systems  Constitutional: Negative for chills, diaphoresis, fever, malaise/fatigue and weight loss.  Gastrointestinal: Negative for abdominal pain, diarrhea,  nausea and vomiting.  Musculoskeletal: Negative for joint pain.    Past Medical History:  Diagnosis Date  . Diabetes mellitus without complication (HCC)   . Hyperlipidemia   . Hypertension   . Neuropathy     Social History  Substance Use Topics  . Smoking status: Never Smoker  . Smokeless tobacco: Never Used  . Alcohol use No    Family History  Problem Relation Age of Onset  . Prostate cancer Father   . Bladder Cancer Neg Hx   . Kidney cancer Neg Hx     Allergies  Allergen Reactions  . Daptomycin Shortness Of Breath    Breathing issues  . Lisinopril     Cough    Objective: Vitals:   02/19/17 0908  Weight: 242 lb (109.8 kg)   Body mass index is 32.82 kg/m.  Physical Exam  Constitutional:  He is in good spirits. His weight is up 14 pounds since his visit one month ago.  Musculoskeletal: He exhibits edema.  He has some nonpitting edema around his right ankle and foot. He has a VAC dressing on his medial ankle incision. Dr. Logan [name] records indicate that it is getting smaller. Mr. Keith [name] states that the wound is now only 2 cm in diameter. There is no surrounding cellulitis.    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 His inflammatory markers are declining. I will have him complete IV antibiotic therapy on 02/24/2017 then stop cefazolin and have the PICC removed. He will follow-up with me in 4 weeks.      Relevant Medications   ceFAZolin (ANCEF) 1 g injection       Keith AstersJohn Evely Gainey, MD [facility] [phone] pager   [phone] cell 02/19/2017,  9:55 AM

## 2017-02-19 NOTE — Telephone Encounter (Signed)
Per verbal order from Dr. Orvan Falconerampbell, [facility] and gave orders to pull patient's PICC line on 8/14.  Debbie verbalized understanding.

## 2017-02-20 ENCOUNTER — Telehealth: Payer: Self-pay | Admitting: Podiatry

## 2017-02-20 ENCOUNTER — Encounter: Payer: Self-pay | Admitting: Podiatry

## 2017-02-20 NOTE — Progress Notes (Signed)
Received a call from the patient's home health nurse about the wound vac. When Keith Berkshirerisha went to see Keith [name] today he had taken the wound VAC off last night and did not want it put back on today. The home health nurse states that she put on a wet to dry dressing and this his wife is a nurse at [employer] and will change the bandage until home health goes back on Monday to re-elevate.   Ovid CurdMatthew Ayrianna [name], DPM

## 2017-02-20 NOTE — Telephone Encounter (Signed)
Angie, [facility] RN is currently at the home of [name], the wound is almost completely healed and they would like to speak with you about the Wound Vac.

## 2017-02-24 ENCOUNTER — Ambulatory Visit (INDEPENDENT_AMBULATORY_CARE_PROVIDER_SITE_OTHER): Payer: BC Managed Care – PPO | Admitting: Podiatry

## 2017-02-24 ENCOUNTER — Encounter: Payer: Self-pay | Admitting: Podiatry

## 2017-02-24 DIAGNOSIS — I70235 Atherosclerosis of native arteries of right leg with ulceration of other part of foot: Secondary | ICD-10-CM | POA: Diagnosis not present

## 2017-02-24 DIAGNOSIS — L97312 Non-pressure chronic ulcer of right ankle with fat layer exposed: Secondary | ICD-10-CM

## 2017-02-24 DIAGNOSIS — E0842 Diabetes mellitus due to underlying condition with diabetic polyneuropathy: Secondary | ICD-10-CM

## 2017-02-25 ENCOUNTER — Other Ambulatory Visit: Payer: Self-pay | Admitting: Pharmacist

## 2017-03-02 ENCOUNTER — Ambulatory Visit (INDEPENDENT_AMBULATORY_CARE_PROVIDER_SITE_OTHER): Payer: BC Managed Care – PPO | Admitting: Podiatry

## 2017-03-02 ENCOUNTER — Telehealth: Payer: Self-pay | Admitting: Podiatry

## 2017-03-02 VITALS — Temp 99.1°F

## 2017-03-02 DIAGNOSIS — E0842 Diabetes mellitus due to underlying condition with diabetic polyneuropathy: Secondary | ICD-10-CM

## 2017-03-02 DIAGNOSIS — L97312 Non-pressure chronic ulcer of right ankle with fat layer exposed: Secondary | ICD-10-CM | POA: Diagnosis not present

## 2017-03-02 DIAGNOSIS — I70235 Atherosclerosis of native arteries of right leg with ulceration of other part of foot: Secondary | ICD-10-CM

## 2017-03-02 MED ORDER — TRAMADOL HCL 50 MG PO TABS: 50.0000 mg | ORAL_TABLET | Freq: Three times a day (TID) | ORAL | 0 refills | Status: DC | PRN

## 2017-03-02 MED ORDER — SULFAMETHOXAZOLE-TRIMETHOPRIM 800-160 MG PO TABS: 1.0000 | ORAL_TABLET | Freq: Two times a day (BID) | ORAL | 0 refills | Status: DC

## 2017-03-02 NOTE — Progress Notes (Signed)
   Subjective:  Patient with a history of diabetes mellitus presents today for acute degenerative changes noted to the patient's right ankle ulceration. Patient was last seen last week on 02/24/2017 at which time he was doing significantly better. The fix skin graft was applied at that time. Patient states that on Friday he wasn't feeling very good and Sunday the pain recurred. Patient states that he has some general loss of energy and malaise.  Objective/Physical Exam Wound #1 located to the medial aspect of the right ankle measuring approximately 2.00.5 2.5  cm. To the noted ulceration there is no eschar. There is a moderate amount of slough fibrin and necrotic tissue noted. Granulation wound base is red. There is no exposed bone muscle-tendon ligament or joint. There is a minimal amount of serosanguineous drainage noted. No malodor. Periwound integrity is intact. Upon expression of the ulceration site there is some purulent drainage noted.  Assessment: 1. Ulcer right medial ankle secondary to DM with cellulitis and abscess  Plan of Care:  1. Patient was evaluated. 2. Medically necessary excisional debridement including muscle and deep fascial tissues performed using a tissue nipper. Excisional debridement of all the necrotic nonviable tissue down to healthy bleeding viable tissue was performed with post-measurements and was pre-.  3. Dry sterile dressings were applied today  4. Prescription for Bactrim DS   5. Prescription for tramadol 50 mg 6. Cultures taken today and sent to pathology for culture and sensitivity 7. Instructed patient to immediately follow up with infectious disease, Dr. Orvan Falconer.   8. Daily dressing changes of Betadine and dry sterile dressing daily. Return to clinic in 1 week    Felecia Shelling, DPM [facility]  Dr. Felecia Shelling, DPM    [address]                                        [place], [place] [address]                Office [phone]  Fax [phone]

## 2017-03-02 NOTE — Telephone Encounter (Signed)
Patient called this morning stating that he got a skin graft on Tuesday with Dr. Logan Bores and was told to keep it on for 72 hours. He has been experiencing no strength, very tired, cannot walk without assistance. Seeing evans tomorrow 08/21 but wanted to know if he could remove the graft?

## 2017-03-02 NOTE — Telephone Encounter (Addendum)
Keith [name] - [employer] states pt called him and states he began to feel bad Thursday, can't walk on the graft application foot since Thursday and his wife states there is redness coming up from the bandage. I informed Dr. Logan Bores and he states have pt come to the [place] office today at 2:00pm or go to the ED now. I left message informing pt of Dr. Logan Bores orders, requested call confirming receipt of call and if would come to [facility] or go to ED. Left 2nd message with Dr. Logan Bores orders.03/05/2017-DrCliffton Asters requested culture of 12/31/2016 and 12/25/2016. Faxed requested results to Dr. Orvan Falconer.

## 2017-03-02 NOTE — Progress Notes (Signed)
   Subjective:  Patient with a history of diabetes mellitus presents today for follow-up evaluation and treatment of an ulcer to the medial aspect of the right ankle. Patient states that he doing much better. Negative pressure wound VAC has been helping significantly. Wound VAC was recently discontinued by the home health agency due to the improvement of the wound. Patient presents today for follow-up treatment and  evaluation and application of epidermal skin graft  Objective/Physical Exam Wound #1 located to the medial aspect of the right ankle measuring approximately 333.333.333.333  cm. To the noted ulceration there is no eschar. There is a moderate amount of slough fibrin and necrotic tissue noted. Granulation wound base is red. There is no exposed bone muscle-tendon ligament or joint. There is a minimal amount of serosanguineous drainage noted. No malodor. Periwound integrity is intact.  Assessment: 1. Ulcer right medial ankle secondary to DM  Plan of Care:  1. Patient was evaluated. 2. discontinue negative pressure wound VAC.   3. The wound was cleansed with normal saline 4. Medically necessary application of 24.58mm Epifix [employer] epidermal skin graft applied to the ulceration to total wound measurement area of 1 cm. Please note the scratches medically necessary. The patient has had the wound for greater than 6 weeks and is felt conservative measurements of treatment and satisfactory alleviation of symptoms the patient. The graft was then fixated with Steri-Strips. Dry sterile dressing was applied. Keep dressings clean dry and intact for 1 week. 5. Return to clinic in 1 week for follow-up treatment and evaluation  EpiFix 24.54mm [id number] EXP 2021-09-11  Felecia Shelling, DPM [facility]  Dr. Felecia Shelling, DPM    [address]                                        [place], [place] [address]                Office [phone]  Fax [phone]

## 2017-03-03 ENCOUNTER — Telehealth: Payer: Self-pay | Admitting: *Deleted

## 2017-03-03 ENCOUNTER — Encounter: Payer: BC Managed Care – PPO | Admitting: Podiatry

## 2017-03-03 NOTE — Telephone Encounter (Signed)
Patient called and advised his PICC was removed Wednesday 02/25/17 and by Friday 02/27/17 he could not put any weight on the foot it hurt so bad. He went to see the wound doctor who was able to squeez puss from the wound and advised him to make an appointment with Dr Orvan Falconer. Advised will have to let the doctor know what is going on with the patient and give him a call back to see when the doctor can see him sooner.

## 2017-03-03 NOTE — Telephone Encounter (Signed)
Please schedule him to see me next Tuesday at 8:45 AM. He was prescribed trimethoprim sulfamethoxazole yesterday by Dr. Logan Bores. Tell him I will call him tomorrow after I check his most recent wound cultures. He may need to have a PICC replaced and restart cefazolin soon.

## 2017-03-04 ENCOUNTER — Telehealth: Payer: Self-pay | Admitting: Podiatry

## 2017-03-04 MED ORDER — CIPROFLOXACIN HCL 500 MG PO TABS: 500.0000 mg | ORAL_TABLET | Freq: Two times a day (BID) | ORAL | 0 refills | Status: DC

## 2017-03-04 NOTE — Telephone Encounter (Signed)
This is Dr. Bradly Chris calling. I'm trying to locate culture results. He had operative cultures done by Dr. Logan Bores on 25 December 2016 and also had culture results done in the office two days ago. If you could please fax those to me at [phone] and you can give me a call on my cell phone and that number is [phone]. Thank you.

## 2017-03-04 NOTE — Addendum Note (Signed)
Addended by: Geraldine Contras D on: 03/04/2017 04:05 PM   Modules accepted: Orders

## 2017-03-04 NOTE — Telephone Encounter (Signed)
Yes, please add Cipro 500mg  BID to regimen. #20 no refills. Thanks, Dr. Logan Bores

## 2017-03-04 NOTE — Telephone Encounter (Signed)
Called the patient and had to leave a message that we are waiting on culture results from Dr Logan Bores. Will explain when he calls back.

## 2017-03-04 NOTE — Telephone Encounter (Signed)
Please let Keith [name] know that I'm waiting to hear back from Dr. Michel Harrow office about his most recent culture results.

## 2017-03-04 NOTE — Telephone Encounter (Signed)
Called the patient and advised him of the appointment and he advised he is not going to make it to next week he can not even walk and feels horrible. He advised he is going to the ED today. He has a fever also. Advised will still put him on the schedule just in case but will let the doctor know what is going on with him.

## 2017-03-04 NOTE — Telephone Encounter (Signed)
I'm calling for my husband. He came in and saw Dr. Logan Bores on Monday because his foot is infected again. He was prescribed bactrim. I was trying to figure out if there is another antibiotic we can add with this. He has called and has not been able to get into infectious disease yet. We were wondering if you guys could try calling and getting him in with Dr. Orvan Falconer and I will try as well. He is having a rough time with this and we really would like to avoid going to the hospital if we can. Is there anything else you guys can do for him? He really is having a hard time with this, he is really week and we would like to try to move forward if we can. Please call me at [phone]. Thank you.

## 2017-03-04 NOTE — Telephone Encounter (Signed)
rx has been sent to pharmacy.  Patients wife notified of medication via voice  mail

## 2017-03-04 NOTE — Telephone Encounter (Signed)
Dr. Logan Bores, I spoke with Serjio's wife Toni Amend and she was wandering if you could start him on another antibiotic along with the Bactrim that he is on.  She informed me that he is very weak and still running slight fever.     I informed her that she will need to take him to the ER if symptoms persist.   Please advise regarding antibiotic

## 2017-03-05 ENCOUNTER — Other Ambulatory Visit: Payer: Self-pay | Admitting: Internal Medicine

## 2017-03-05 ENCOUNTER — Telehealth: Payer: Self-pay | Admitting: Internal Medicine

## 2017-03-05 DIAGNOSIS — M009 Pyogenic arthritis, unspecified: Secondary | ICD-10-CM

## 2017-03-05 LAB — WOUND CULTURE
Gram Stain: NONE SEEN
Gram Stain: NONE SEEN
Gram Stain: NONE SEEN

## 2017-03-05 MED ORDER — CEFAZOLIN SODIUM 1 G IV SOLR: 2.0000 g | Freq: Three times a day (TID) | INTRAVENOUS | Status: DC

## 2017-03-05 NOTE — Telephone Encounter (Signed)
Keith [name], just so you know Dr. Cliffton Asters just called with a follow up phone call since he did not hear anything back from our office yesterday. I transferred the call to [facility] and she is taking care of it so you do not have to worry about it.

## 2017-03-05 NOTE — Telephone Encounter (Signed)
I spoke with Keith [name]'s wife Keith [name] [phone]) today (he is currently undergoing a brain MRI). [place] has purulent drainage from his right ankle wound growing MSSA again. He has low grade fever and feels miserable. I will have a PICC replaced and restart cefazolin.

## 2017-03-05 NOTE — Telephone Encounter (Addendum)
Left message for Keith [name] in IR to have the PICC placed on Tuesday 8/28 after appointment with Dr Orvan Falconer.  Per Dr Orvan Falconer, patient would prefer to have all care scheduled in [place] while he is here, as he lives in [place]. Called [facility] to re-engage care. Per Dr Orvan Falconer, he will need cefazolin 2 gm every 8 hours for 6 weeks. Will send signed orders. Will follow up with patient's wife at her request with update. Andree Coss, RN

## 2017-03-05 NOTE — Telephone Encounter (Signed)
Keith [name] completed 6 weeks of IV cefazolin on 02/24/2017. Apparently he's having increased drainage from his right ankle. Cultures of drainage collected on 03/02/2017 are growing MSSA again. I called him yesterday and again this morning and left a message on his voicemail for him to call me. I believe he is on oral trimethoprim sulfamethoxazole. He may need to go back on IV cefazolin or change to oral cephalexin depending on how he is doing.

## 2017-03-06 NOTE — Telephone Encounter (Signed)
Patient scheduled for PICC placement 8/28 at 12:00, arrival at 1130 at [facility]. RN notified Toni Amend, she accepted appointment. [facility] orders need MD signature, verbal order for antibiotics given to [facility] at [facility].  Once orders are signed, RN will fax to [facility]. Andree Coss, RN

## 2017-03-10 ENCOUNTER — Encounter (HOSPITAL_COMMUNITY): Payer: Self-pay | Admitting: General Practice

## 2017-03-10 ENCOUNTER — Encounter: Payer: Self-pay | Admitting: Internal Medicine

## 2017-03-10 ENCOUNTER — Ambulatory Visit (INDEPENDENT_AMBULATORY_CARE_PROVIDER_SITE_OTHER): Payer: BC Managed Care – PPO | Admitting: Internal Medicine

## 2017-03-10 ENCOUNTER — Telehealth: Payer: Self-pay | Admitting: Podiatry

## 2017-03-10 ENCOUNTER — Ambulatory Visit (HOSPITAL_COMMUNITY): Admission: RE | Admit: 2017-03-10 | Payer: BC Managed Care – PPO | Source: Ambulatory Visit

## 2017-03-10 ENCOUNTER — Ambulatory Visit: Payer: BC Managed Care – PPO | Admitting: Podiatry

## 2017-03-10 ENCOUNTER — Inpatient Hospital Stay (HOSPITAL_COMMUNITY)
Admission: AD | Admit: 2017-03-10 | Discharge: 2017-03-21 | DRG: 463 | Disposition: A | Discharge status: Home / Self Care | Payer: BC Managed Care – PPO | Source: Ambulatory Visit | Attending: Internal Medicine | Admitting: Internal Medicine

## 2017-03-10 DIAGNOSIS — L02611 Cutaneous abscess of right foot: Secondary | ICD-10-CM | POA: Diagnosis present

## 2017-03-10 DIAGNOSIS — N170 Acute kidney failure with tubular necrosis: Secondary | ICD-10-CM | POA: Diagnosis present

## 2017-03-10 DIAGNOSIS — E875 Hyperkalemia: Secondary | ICD-10-CM | POA: Diagnosis present

## 2017-03-10 DIAGNOSIS — N179 Acute kidney failure, unspecified: Secondary | ICD-10-CM | POA: Diagnosis not present

## 2017-03-10 DIAGNOSIS — M86171 Other acute osteomyelitis, right ankle and foot: Principal | ICD-10-CM | POA: Diagnosis present

## 2017-03-10 DIAGNOSIS — I10 Essential (primary) hypertension: Secondary | ICD-10-CM | POA: Diagnosis present

## 2017-03-10 DIAGNOSIS — E785 Hyperlipidemia, unspecified: Secondary | ICD-10-CM | POA: Diagnosis present

## 2017-03-10 DIAGNOSIS — E7800 Pure hypercholesterolemia, unspecified: Secondary | ICD-10-CM | POA: Diagnosis present

## 2017-03-10 DIAGNOSIS — M869 Osteomyelitis, unspecified: Secondary | ICD-10-CM | POA: Diagnosis not present

## 2017-03-10 DIAGNOSIS — M8448XA Pathological fracture, other site, initial encounter for fracture: Secondary | ICD-10-CM | POA: Diagnosis present

## 2017-03-10 DIAGNOSIS — L97319 Non-pressure chronic ulcer of right ankle with unspecified severity: Secondary | ICD-10-CM | POA: Diagnosis present

## 2017-03-10 DIAGNOSIS — D638 Anemia in other chronic diseases classified elsewhere: Secondary | ICD-10-CM | POA: Diagnosis present

## 2017-03-10 DIAGNOSIS — E860 Dehydration: Secondary | ICD-10-CM | POA: Diagnosis present

## 2017-03-10 DIAGNOSIS — L03115 Cellulitis of right lower limb: Secondary | ICD-10-CM | POA: Diagnosis present

## 2017-03-10 DIAGNOSIS — E119 Type 2 diabetes mellitus without complications: Secondary | ICD-10-CM

## 2017-03-10 DIAGNOSIS — Z794 Long term (current) use of insulin: Secondary | ICD-10-CM

## 2017-03-10 DIAGNOSIS — E872 Acidosis: Secondary | ICD-10-CM | POA: Diagnosis present

## 2017-03-10 DIAGNOSIS — Z7982 Long term (current) use of aspirin: Secondary | ICD-10-CM

## 2017-03-10 DIAGNOSIS — D62 Acute posthemorrhagic anemia: Secondary | ICD-10-CM | POA: Diagnosis not present

## 2017-03-10 DIAGNOSIS — Z6831 Body mass index (BMI) 31.0-31.9, adult: Secondary | ICD-10-CM

## 2017-03-10 DIAGNOSIS — M1991 Primary osteoarthritis, unspecified site: Secondary | ICD-10-CM | POA: Diagnosis present

## 2017-03-10 DIAGNOSIS — M009 Pyogenic arthritis, unspecified: Secondary | ICD-10-CM | POA: Diagnosis present

## 2017-03-10 DIAGNOSIS — R634 Abnormal weight loss: Secondary | ICD-10-CM | POA: Diagnosis present

## 2017-03-10 DIAGNOSIS — I951 Orthostatic hypotension: Secondary | ICD-10-CM | POA: Diagnosis present

## 2017-03-10 DIAGNOSIS — E1159 Type 2 diabetes mellitus with other circulatory complications: Secondary | ICD-10-CM | POA: Diagnosis not present

## 2017-03-10 DIAGNOSIS — Z881 Allergy status to other antibiotic agents status: Secondary | ICD-10-CM

## 2017-03-10 DIAGNOSIS — N400 Enlarged prostate without lower urinary tract symptoms: Secondary | ICD-10-CM | POA: Diagnosis present

## 2017-03-10 DIAGNOSIS — B9561 Methicillin susceptible Staphylococcus aureus infection as the cause of diseases classified elsewhere: Secondary | ICD-10-CM | POA: Diagnosis present

## 2017-03-10 DIAGNOSIS — E669 Obesity, unspecified: Secondary | ICD-10-CM | POA: Diagnosis present

## 2017-03-10 DIAGNOSIS — E1042 Type 1 diabetes mellitus with diabetic polyneuropathy: Secondary | ICD-10-CM | POA: Diagnosis present

## 2017-03-10 DIAGNOSIS — E1065 Type 1 diabetes mellitus with hyperglycemia: Secondary | ICD-10-CM | POA: Diagnosis not present

## 2017-03-10 DIAGNOSIS — Z79899 Other long term (current) drug therapy: Secondary | ICD-10-CM

## 2017-03-10 DIAGNOSIS — Z888 Allergy status to other drugs, medicaments and biological substances status: Secondary | ICD-10-CM

## 2017-03-10 DIAGNOSIS — E10622 Type 1 diabetes mellitus with other skin ulcer: Secondary | ICD-10-CM | POA: Diagnosis present

## 2017-03-10 HISTORY — DX: Type 2 diabetes mellitus with diabetic polyneuropathy: E11.42

## 2017-03-10 HISTORY — DX: Type 1 diabetes mellitus without complications: E10.9

## 2017-03-10 LAB — COMPREHENSIVE METABOLIC PANEL
ALT: 69 U/L — ABNORMAL HIGH (ref 17–63)
AST: 72 U/L — ABNORMAL HIGH (ref 15–41)
Albumin: 2.7 g/dL — ABNORMAL LOW (ref 3.5–5.0)
Alkaline Phosphatase: 104 U/L (ref 38–126)
Anion gap: 9 (ref 5–15)
BUN: 56 mg/dL — ABNORMAL HIGH (ref 6–20)
CO2: 20 mmol/L — ABNORMAL LOW (ref 22–32)
Calcium: 10.6 mg/dL — ABNORMAL HIGH (ref 8.9–10.3)
Chloride: 103 mmol/L (ref 101–111)
Creatinine, Ser: 2.48 mg/dL — ABNORMAL HIGH (ref 0.61–1.24)
GFR calc Af Amer: 34 mL/min — ABNORMAL LOW (ref >60)
GFR calc non Af Amer: 30 mL/min — ABNORMAL LOW (ref >60)
Glucose, Bld: 92 mg/dL (ref 65–99)
Potassium: 6.6 mmol/L (ref 3.5–5.1)
Sodium: 132 mmol/L — ABNORMAL LOW (ref 135–145)
Total Bilirubin: 0.7 mg/dL (ref 0.3–1.2)
Total Protein: 8.2 g/dL — ABNORMAL HIGH (ref 6.5–8.1)

## 2017-03-10 LAB — BASIC METABOLIC PANEL
Anion gap: 9 (ref 5–15)
BUN: 50 mg/dL — AB (ref 6–20)
CALCIUM: 10.2 mg/dL (ref 8.9–10.3)
CO2: 20 mmol/L — ABNORMAL LOW (ref 22–32)
Chloride: 104 mmol/L (ref 101–111)
Creatinine, Ser: 1.94 mg/dL — ABNORMAL HIGH (ref 0.61–1.24)
GFR calc Af Amer: 46 mL/min — ABNORMAL LOW (ref >60)
GFR, EST NON AFRICAN AMERICAN: 40 mL/min — AB (ref >60)
GLUCOSE: 153 mg/dL — AB (ref 65–99)
Potassium: 5.4 mmol/L — ABNORMAL HIGH (ref 3.5–5.1)
Sodium: 133 mmol/L — ABNORMAL LOW (ref 135–145)

## 2017-03-10 LAB — CBC WITH DIFFERENTIAL/PLATELET
Basophils Absolute: 0 10*3/uL (ref 0.0–0.1)
Basophils Relative: 0 %
Eosinophils Absolute: 0.2 10*3/uL (ref 0.0–0.7)
Eosinophils Relative: 1 %
HCT: 33 % — ABNORMAL LOW (ref 39.0–52.0)
Hemoglobin: 10.2 g/dL — ABNORMAL LOW (ref 13.0–17.0)
Lymphocytes Relative: 7 %
Lymphs Abs: 0.9 10*3/uL (ref 0.7–4.0)
MCH: 24.5 pg — ABNORMAL LOW (ref 26.0–34.0)
MCHC: 30.9 g/dL (ref 30.0–36.0)
MCV: 79.3 fL (ref 78.0–100.0)
Monocytes Absolute: 0.6 10*3/uL (ref 0.1–1.0)
Monocytes Relative: 4 %
Neutro Abs: 12.1 10*3/uL — ABNORMAL HIGH (ref 1.7–7.7)
Neutrophils Relative %: 88 %
Platelets: 355 10*3/uL (ref 150–400)
RBC: 4.16 MIL/uL — ABNORMAL LOW (ref 4.22–5.81)
RDW: 19.3 % — ABNORMAL HIGH (ref 11.5–15.5)
WBC: 13.8 10*3/uL — ABNORMAL HIGH (ref 4.0–10.5)

## 2017-03-10 LAB — SEDIMENTATION RATE: Sed Rate: 123 mm/hr — ABNORMAL HIGH (ref 0–16)

## 2017-03-10 LAB — GLUCOSE, CAPILLARY
GLUCOSE-CAPILLARY: 87 mg/dL (ref 65–99)
Glucose-Capillary: 116 mg/dL — ABNORMAL HIGH (ref 65–99)
Glucose-Capillary: 189 mg/dL — ABNORMAL HIGH (ref 65–99)

## 2017-03-10 LAB — MAGNESIUM: Magnesium: 2.1 mg/dL (ref 1.7–2.4)

## 2017-03-10 LAB — HEMOGLOBIN A1C
Hgb A1c MFr Bld: 6.2 % — ABNORMAL HIGH (ref 4.8–5.6)
MEAN PLASMA GLUCOSE: 131.24 mg/dL

## 2017-03-10 LAB — LACTIC ACID, PLASMA: LACTIC ACID, VENOUS: 1.3 mmol/L (ref 0.5–1.9)

## 2017-03-10 MED ORDER — INSULIN ASPART 100 UNIT/ML IV SOLN
10.0000 [IU] | Freq: Once | INTRAVENOUS | Status: AC
  Administered 2017-03-10: 10 [IU] via INTRAVENOUS

## 2017-03-10 MED ORDER — SODIUM CHLORIDE 0.9 % IV SOLN
1.0000 g | INTRAVENOUS | Status: AC
  Administered 2017-03-10: 1 g via INTRAVENOUS
  Filled 2017-03-10: qty 10

## 2017-03-10 MED ORDER — ENOXAPARIN SODIUM 40 MG/0.4ML ~~LOC~~ SOLN
40.0000 mg | Freq: Every day | SUBCUTANEOUS | Status: DC
  Administered 2017-03-10 – 2017-03-13 (×2): 40 mg via SUBCUTANEOUS
  Filled 2017-03-10 (×9): qty 0.4

## 2017-03-10 MED ORDER — ONDANSETRON HCL 4 MG PO TABS: 4.0000 mg | ORAL_TABLET | Freq: Four times a day (QID) | ORAL | Status: DC | PRN

## 2017-03-10 MED ORDER — COLCHICINE 0.6 MG PO TABS: 0.6000 mg | ORAL_TABLET | Freq: Every day | ORAL | Status: DC

## 2017-03-10 MED ORDER — INSULIN GLARGINE 100 UNIT/ML ~~LOC~~ SOLN
20.0000 [IU] | Freq: Every day | SUBCUTANEOUS | Status: DC
  Filled 2017-03-10: qty 0.2

## 2017-03-10 MED ORDER — CALCIUM GLUCONATE 10 % IV SOLN: 1.0000 g | Freq: Once | INTRAVENOUS | Status: DC

## 2017-03-10 MED ORDER — ASPIRIN EC 81 MG PO TBEC
81.0000 mg | DELAYED_RELEASE_TABLET | Freq: Every day | ORAL | Status: DC
  Administered 2017-03-11 – 2017-03-21 (×11): 81 mg via ORAL
  Filled 2017-03-10 (×11): qty 1

## 2017-03-10 MED ORDER — DEXTROSE 50 % IV SOLN: 25.0000 mL | Freq: Once | INTRAVENOUS | Status: DC

## 2017-03-10 MED ORDER — CEFAZOLIN SODIUM-DEXTROSE 2-4 GM/100ML-% IV SOLN
2.0000 g | Freq: Three times a day (TID) | INTRAVENOUS | Status: DC
  Administered 2017-03-10 – 2017-03-21 (×35): 2 g via INTRAVENOUS
  Filled 2017-03-10 (×35): qty 100

## 2017-03-10 MED ORDER — TAMSULOSIN HCL 0.4 MG PO CAPS: 0.4000 mg | ORAL_CAPSULE | Freq: Every day | ORAL | Status: DC

## 2017-03-10 MED ORDER — LOSARTAN POTASSIUM 50 MG PO TABS: 50.0000 mg | ORAL_TABLET | Freq: Every day | ORAL | Status: DC

## 2017-03-10 MED ORDER — INSULIN ASPART 100 UNIT/ML ~~LOC~~ SOLN: 10.0000 [IU] | Freq: Once | SUBCUTANEOUS | Status: DC

## 2017-03-10 MED ORDER — INSULIN ASPART 100 UNIT/ML ~~LOC~~ SOLN
0.0000 [IU] | Freq: Every day | SUBCUTANEOUS | Status: DC
  Administered 2017-03-12: 4 [IU] via SUBCUTANEOUS
  Administered 2017-03-14: 2 [IU] via SUBCUTANEOUS
  Administered 2017-03-16 – 2017-03-18 (×2): 3 [IU] via SUBCUTANEOUS

## 2017-03-10 MED ORDER — SODIUM CHLORIDE 0.9 % IV BOLUS (SEPSIS)
1000.0000 mL | Freq: Once | INTRAVENOUS | Status: AC
  Administered 2017-03-10: 1000 mL via INTRAVENOUS

## 2017-03-10 MED ORDER — ACETAMINOPHEN 325 MG PO TABS: 650.0000 mg | ORAL_TABLET | ORAL | Status: DC | PRN

## 2017-03-10 MED ORDER — INSULIN ASPART 100 UNIT/ML ~~LOC~~ SOLN
0.0000 [IU] | Freq: Three times a day (TID) | SUBCUTANEOUS | Status: DC
  Administered 2017-03-11 – 2017-03-12 (×2): 2 [IU] via SUBCUTANEOUS
  Administered 2017-03-13: 8 [IU] via SUBCUTANEOUS
  Administered 2017-03-13 (×2): 5 [IU] via SUBCUTANEOUS
  Administered 2017-03-14: 3 [IU] via SUBCUTANEOUS
  Administered 2017-03-14: 2 [IU] via SUBCUTANEOUS
  Administered 2017-03-15 (×2): 3 [IU] via SUBCUTANEOUS
  Administered 2017-03-15: 8 [IU] via SUBCUTANEOUS
  Administered 2017-03-16: 5 [IU] via SUBCUTANEOUS
  Administered 2017-03-16: 3 [IU] via SUBCUTANEOUS
  Administered 2017-03-16 – 2017-03-17 (×2): 2 [IU] via SUBCUTANEOUS
  Administered 2017-03-17 (×2): 3 [IU] via SUBCUTANEOUS
  Administered 2017-03-18 (×2): 2 [IU] via SUBCUTANEOUS
  Administered 2017-03-20: 3 [IU] via SUBCUTANEOUS
  Administered 2017-03-20: 5 [IU] via SUBCUTANEOUS
  Administered 2017-03-20 – 2017-03-21 (×2): 3 [IU] via SUBCUTANEOUS

## 2017-03-10 MED ORDER — INSULIN GLARGINE 100 UNIT/ML ~~LOC~~ SOLN
80.0000 [IU] | Freq: Every day | SUBCUTANEOUS | Status: DC
  Administered 2017-03-10: 80 [IU] via SUBCUTANEOUS
  Filled 2017-03-10: qty 0.8

## 2017-03-10 MED ORDER — OXYCODONE HCL 5 MG PO TABS
5.0000 mg | ORAL_TABLET | ORAL | Status: DC | PRN
  Administered 2017-03-10 – 2017-03-12 (×5): 5 mg via ORAL
  Filled 2017-03-10 (×2): qty 1
  Filled 2017-03-10: qty 2
  Filled 2017-03-10 (×2): qty 1

## 2017-03-10 MED ORDER — GABAPENTIN 600 MG PO TABS
1800.0000 mg | ORAL_TABLET | Freq: Three times a day (TID) | ORAL | Status: DC
  Administered 2017-03-10 – 2017-03-21 (×31): 1800 mg via ORAL
  Filled 2017-03-10 (×34): qty 3

## 2017-03-10 MED ORDER — ONDANSETRON HCL 4 MG/2ML IJ SOLN
4.0000 mg | Freq: Four times a day (QID) | INTRAMUSCULAR | Status: DC | PRN
  Administered 2017-03-12: 4 mg via INTRAVENOUS

## 2017-03-10 MED ORDER — FERROUS SULFATE 325 (65 FE) MG PO TABS
325.0000 mg | ORAL_TABLET | Freq: Three times a day (TID) | ORAL | Status: DC
  Administered 2017-03-10 – 2017-03-21 (×31): 325 mg via ORAL
  Filled 2017-03-10 (×31): qty 1

## 2017-03-10 MED ORDER — ATORVASTATIN CALCIUM 40 MG PO TABS
40.0000 mg | ORAL_TABLET | Freq: Every day | ORAL | Status: DC
  Administered 2017-03-10 – 2017-03-17 (×8): 40 mg via ORAL
  Filled 2017-03-10 (×8): qty 1

## 2017-03-10 MED ORDER — PAROXETINE HCL 20 MG PO TABS
20.0000 mg | ORAL_TABLET | Freq: Every day | ORAL | Status: DC
  Administered 2017-03-11 – 2017-03-21 (×11): 20 mg via ORAL
  Filled 2017-03-10 (×11): qty 1

## 2017-03-10 MED ORDER — SODIUM CHLORIDE 0.9 % IV SOLN
INTRAVENOUS | Status: DC
  Administered 2017-03-10 – 2017-03-14 (×12): via INTRAVENOUS

## 2017-03-10 MED ORDER — DEXTROSE 50 % IV SOLN
25.0000 mL | Freq: Once | INTRAVENOUS | Status: AC
  Administered 2017-03-10: 25 mL via INTRAVENOUS
  Filled 2017-03-10: qty 50

## 2017-03-10 MED ORDER — MORPHINE SULFATE (PF) 2 MG/ML IV SOLN: 1.0000 mg | INTRAVENOUS | Status: DC | PRN

## 2017-03-10 MED ORDER — KETOROLAC TROMETHAMINE 15 MG/ML IJ SOLN: 15.0000 mg | Freq: Four times a day (QID) | INTRAMUSCULAR | Status: DC | PRN

## 2017-03-10 NOTE — Progress Notes (Signed)
[facility]   Recently active pt with [facility] prior to this readmission for home IV ABX.  Alerted by [facility] team that pt will be readmitted for evaluation/work up.   [facility] will follow pt while inpatient to support IV ABX at home as ordered by ID team.  If patient discharges after hours, please call [phone].   Sedalia Muta 03/10/2017, 1:38 PM

## 2017-03-10 NOTE — Progress Notes (Signed)
CRITICAL VALUE ALERT  Critical Value:  K 6.6 Date & Time Notied:  03/10/17 1317  Provider Notified: Dr. Konrad Dolores paged at 1318  Orders Received/Actions taken: MD placed orders

## 2017-03-10 NOTE — Telephone Encounter (Signed)
Wife called and said that he would not be here this afternoon for his appointment with Dr. Logan Bores. They are at infectious disease and he is being admitted. She would like a call from Dr. Logan Bores asap to discuss his condition.

## 2017-03-10 NOTE — Progress Notes (Signed)
Pt wants to wait till the morning to have MRI of Rt ankle done. Will continue to monitor pt. Nelda Marseille, RN

## 2017-03-10 NOTE — H&P (Signed)
History and Physical    Keith E Lazaro YKZ:[id number] DOB: 09/27/1970 DOA: 03/10/2017   PCP: [facility]   Attending physician: Marily Memos  Patient coming from/Resides with: Private residence/wife  Chief Complaint: Recurrent ankle infection  HPI: Keith [name] is a 46 y.o. male with medical history significant for diabetes mellitus 2 on insulin, obesity, BPH on Flomax, hypertension, dyslipidemia, peripheral neuropathy, and septic arthritis of right ankle. The patient was hospitalized at [facility] May 2018 for septic right ankle. At that time there was no open wound. Podiatrist recommended to discontinue antibiotics given during the hospitalization. He had previously been treated with Bactrim for 2 weeks. Unfortunately the ankle became worse and he developed an open wound. He also underwent intraoperative I&D on 6/14 with cultures being positive for MSSA and he was started on amoxicillin. He was evaluated on 6/29 by ID and changed to IV cefazolin via PICC line with this drug actually initiated on 7/3. He completed 6 weeks of antibiotics on 8/14. Shortly after completing these antibiotics he noticed increased swelling and drainage from the ankle and increasing severe shooting type pains. He followed up with his podiatrist on 8/20 and wound cultures again were positive for MSSA. He was started on Bactrim. Oral ciprofloxacin was added on 8/22. Today he followed up with the ID clinic/Dr. Megan Salon complaining of extreme weakness, no appetite, and weight loss of 20 pounds. Clinically he appeared pale and was tachycardic with SBP in the 90s. He was sent directly from the office to the hospital for admission. Of note the ankle wound which had nearly healed from the previous surgery and antibiotic treatment has now opened and is at least 3 x 4 cm.   Review of Systems:  In addition to the HPI above,  No Headache, changes with Vision or hearing, new weakness, tingling, numbness in any extremity, dysarthria or  word finding difficulty, gait disturbance or imbalance, tremors or seizure activity No problems swallowing food or Liquids, indigestion/reflux, choking or coughing while eating, abdominal pain with or after eating No Chest pain, Cough or Shortness of Breath, palpitations, orthopnea or DOE No Abdominal pain, N/V, melena,hematochezia, dark tarry stools, constipation No dysuria, malodorous urine, hematuria or flank pain No new masses or bruises, No new joint pains, aches, swelling or redness No recent unintentional weight gain  No polyuria, polydypsia or polyphagia   Past Medical History:  Diagnosis Date  . Diabetes mellitus without complication ([place])   . Hyperlipidemia   . Hypertension   . Neuropathy     Past Surgical History:  Procedure Laterality Date  . IR FLUORO GUIDE CV LINE RIGHT  01/13/2017  . IR US GUIDE VASC ACCESS RIGHT  01/13/2017  . IRRIGATION AND DEBRIDEMENT FOOT Left 04/02/2016   Procedure: IRRIGATION AND DEBRIDEMENT FOOT;  Surgeon: Samara Deist, DPM;  Location: [facility] ORS;  Service: Podiatry;  Laterality: Left;    Social History   Social History  . Marital status: Married    Spouse name: N/A  . Number of children: N/A  . Years of education: N/A   Occupational History  . Not on file.   Social History Main Topics  . Smoking status: Never Smoker  . Smokeless tobacco: Never Used  . Alcohol use No  . Drug use: No  . Sexual activity: Not on file   Other Topics Concern  . Not on file   Social History Narrative  . No narrative on file    Mobility: Previously independent Work history: Not obtained   Allergies  Allergen Reactions  . Daptomycin Anaphylaxis and Shortness Of Breath    Breathing issues  . Lisinopril Other (See Comments)    Cough    Family History  Problem Relation Age of Onset  . Prostate cancer Father   . Bladder Cancer Neg Hx   . Kidney cancer Neg Hx      Prior to Admission medications   Medication Sig Start Date End Date Taking?  Authorizing Provider  acetaminophen (TYLENOL) 500 MG tablet Take 500 mg by mouth every 6 (six) hours as needed.   Yes [provider]  aspirin 81 MG tablet Take 81 mg by mouth daily.   Yes [provider]  atorvastatin (LIPITOR) 40 MG tablet Take 40 mg by mouth daily.   Yes [provider]  ciprofloxacin (CIPRO) 500 MG tablet Take 1 tablet (500 mg total) by mouth 2 (two) times daily. 03/04/17  Yes Edrick Kins, DPM  DOXYLAMINE-DM PO Take 2 tablets by mouth at bedtime.   Yes [provider]  ferrous sulfate 325 (65 FE) MG tablet Take 325 mg by mouth 2 (two) times daily with a meal.  01/21/17 01/21/18 Yes [provider]  gabapentin (NEURONTIN) 600 MG tablet Take 1,800 mg by mouth 3 (three) times daily.    Yes [provider]  ibuprofen (ADVIL,MOTRIN) 800 MG tablet Take 1 tablet (800 mg total) by mouth every 8 (eight) hours as needed. 12/12/16  Yes Edrick Kins, DPM  insulin aspart (NOVOLOG) 100 UNIT/ML injection Inject 10 Units into the skin 3 (three) times daily before meals. Per sliding scale Patient taking differently: Inject 15 Units into the skin 3 (three) times daily before meals. Per sliding scale 11/28/16  Yes Epifanio Lesches, MD  Insulin Glargine (BASAGLAR KWIKPEN) 100 UNIT/ML SOPN Inject 24-94 Units into the skin See admin instructions. 24 units in the morning and 94 units at bedtime 02/05/17  Yes [provider]  losartan (COZAAR) 50 MG tablet Take 50 mg by mouth daily.   Yes [provider]  PARoxetine (PAXIL) 20 MG tablet Take 20 mg by mouth daily.  01/21/17  Yes [provider]  polyethylene glycol (MIRALAX / GLYCOLAX) packet Take 17 g by mouth daily. Mix one tablespoon with 8oz of your favorite juice or water every day until you are having soft formed stools. Then start taking once daily if you didn't have a stool the day before. Patient taking differently: Take 17 g by mouth daily as needed for mild  constipation.  11/06/16  Yes Merlyn Lot, MD  Probiotic Product (ALIGN PO) Take 1 capsule by mouth daily.   Yes [provider]  Psyllium-Calcium (METAMUCIL PLUS CALCIUM) CAPS Take 1 capsule by mouth daily.   Yes [provider]  sulfamethoxazole-trimethoprim (BACTRIM DS,SEPTRA DS) 800-160 MG tablet Take 1 tablet by mouth 2 (two) times daily. 03/02/17  Yes Edrick Kins, DPM  tamsulosin (FLOMAX) 0.4 MG CAPS capsule TAKE ONE CAPSULE BY MOUTH DAILY 01/05/17  Yes McGowan, Larene Beach A, PA-C  traMADol (ULTRAM) 50 MG tablet Take 1 tablet (50 mg total) by mouth every 8 (eight) hours as needed. 03/02/17  Yes Edrick Kins, DPM  ceFAZolin (ANCEF) 1 g injection Inject 2,000 mg (2 g total) into the vein every 8 (eight) hours. Patient not taking: Reported on 03/10/2017 03/10/17   Michel Bickers, MD    Physical Exam: Vitals:   03/10/17 1148  BP: (!) 97/51  Pulse: (!) 102  Resp: 18  Temp: 98.2 F (36.8 C)  TempSrc: Oral  SpO2: 100%      Constitutional: NAD, Mildly anxious, uncomfortable 2/2 ankle pain-appears pale Eyes: PERRL, lids and conjunctivae normal ENMT: Mucous membranes are moist. Posterior pharynx clear of any exudate or lesions. Normal dentition.  Neck: normal, supple, no masses, no thyromegaly Respiratory: clear to auscultation bilaterally, no wheezing, no crackles. Normal respiratory effort. No accessory muscle use.  Cardiovascular: Regular rhythm with tachycardic rate, no murmurs / rubs / gallops. No significant extremity edema although does have 1+ edema right foot. 1+ pedal pulsesOn the right and 2+ on left. No carotid bruits.  Abdomen: no tenderness, no masses palpated. No hepatosplenomegaly. Bowel sounds positive.  Musculoskeletal: no clubbing / cyanosis. No joint deformity upper and lower extremities. Good ROM, no contractures. Normal muscle tone.  Skin: no rashes, lesions. Patient does have erythematous areas around the right ankle extending down towards the  foot with a large ulcer estimated at 3 x 4 cm with beefy red granular tissue in the base Neurologic: CN 2-12 grossly intact. Sensation intact, DTR normal. Strength 5/5 x all 4 extremities. Having difficulty weightbearing and standing secondary to pain in right foot Psychiatric: Normal judgment and insight. Alert and oriented x 3. Anxious mood.    Labs on Admission: I have personally reviewed following labs and imaging studies  CBC:  Recent Labs Lab 03/10/17 1227  WBC 13.8*  NEUTROABS 12.1*  HGB 10.2*  HCT 33.0*  MCV 79.3  PLT 517   Basic Metabolic Panel:  Recent Labs Lab 03/10/17 1227  NA 132*  K 6.6*  CL 103  CO2 20*  GLUCOSE 92  BUN 56*  CREATININE 2.48*  CALCIUM 10.6*  MG 2.1   GFR: Estimated Creatinine Clearance: 46.1 mL/min (A) (by C-G formula based on SCr of 2.48 mg/dL (H)). Liver Function Tests:  Recent Labs Lab 03/10/17 1227  AST 72*  ALT 69*  ALKPHOS 104  BILITOT 0.7  PROT 8.2*  ALBUMIN 2.7*   No results for input(s): LIPASE, AMYLASE in the last 168 hours. No results for input(s): AMMONIA in the last 168 hours. Coagulation Profile: No results for input(s): INR, PROTIME in the last 168 hours. Cardiac Enzymes: No results for input(s): CKTOTAL, CKMB, CKMBINDEX, TROPONINI in the last 168 hours. BNP (last 3 results) No results for input(s): PROBNP in the last 8760 hours. HbA1C:  Recent Labs  03/10/17 1227  HGBA1C 6.2*   CBG:  Recent Labs Lab 03/10/17 1205  GLUCAP 87   Lipid Profile: No results for input(s): CHOL, HDL, LDLCALC, TRIG, CHOLHDL, LDLDIRECT in the last 72 hours. Thyroid Function Tests: No results for input(s): TSH, T4TOTAL, FREET4, T3FREE, THYROIDAB in the last 72 hours. Anemia Panel: No results for input(s): VITAMINB12, FOLATE, FERRITIN, TIBC, IRON, RETICCTPCT in the last 72 hours. Urine analysis:    Component Value Date/Time   COLORURINE GREEN (A) 11/06/2016 1758   APPEARANCEUR CLOUDY (A) 11/06/2016 1758   LABSPEC 1.018  11/06/2016 1758   PHURINE 5.0 11/06/2016 1758   GLUCOSEU NEGATIVE 11/06/2016 1758   HGBUR SMALL (A) 11/06/2016 1758   BILIRUBINUR NEGATIVE 11/06/2016 [place] 11/06/2016 1758   PROTEINUR 100 (A) 11/06/2016 1758   NITRITE NEGATIVE 11/06/2016 1758   LEUKOCYTESUR LARGE (A) 11/06/2016 1758   Sepsis Labs: _0 (procalcitonin:4,lacticidven:4) ) Recent Results (from the past 240 hour(s))  WOUND CULTURE     Status: None   Collection Time: 03/02/17  3:00 PM  Result Value Ref Range Status   Culture Abundant STAPHYLOCOCCUS AUREUS  Final   Gram  Stain No WBC Seen  Final   Gram Stain No Squamous Epithelial Cells Seen  Final   Gram Stain No Organisms Seen  Final   Organism ID, Bacteria STAPHYLOCOCCUS AUREUS  Final    Comment: Rifampin and Gentamicin should not be used as single drugs for treatment of Staph infections.       Susceptibility   Staphylococcus aureus -  (no method available)    OXACILLIN 0.5 Sensitive     CEFAZOLIN  Sensitive     GENTAMICIN <=0.5 Sensitive     CIPROFLOXACIN <=0.5 Sensitive     LEVOFLOXACIN 0.25 Sensitive     MOXIFLOXACIN <=0.25 Sensitive     TRIMETH/SULFA <=10 Sensitive     VANCOMYCIN <=0.5 Sensitive     CLINDAMYCIN <=0.25 Sensitive     ERYTHROMYCIN <=0.25 Sensitive     LINEZOLID 2 Sensitive     QUINUPRISTIN/DALF <=0.25 Sensitive     RIFAMPIN <=0.5 Sensitive     TETRACYCLINE <=1 Sensitive      Radiological Exams on Admission: No results found.   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 Amalia Hailey)  Active Problems:   Orthostatic hypotension -Patient noted to be pale and tachycardic upon presentation -Sitting BP decreased to 63/31 and standing BP dropped to 79/47 with patient reporting dizziness -Patient reported significant anorexia and poor oral intake with weight loss of 20 pounds over the past 2-3 weeks -Normal saline bolus 1 L and begin fluids at 150/hr -Hold preadmission Cozaar and Flomax-patient had taken usual a.m. meds prior to arrival -Resting blood pressure on the low remains greater than 90 but will continue to follow    Acute kidney injury/Acute hyperkalemia/Metabolic acidemia -2/2 to ARB, NSAIDS, dehydration and hypoperfusion- suspect degree of superimposed ATN -Hold offending meds as above -Continue aggressive IVF hydration -IV D50+insulin 10 units+ Ca gluconate -repeat labs at 6 pm after above treatments completed -Initiate telemetry    Diabetes mellitus without complication  -Continue home Lantus -HgbA1c -SSI    Hypertension -Current blood pressure suboptimal as above -Holding antihypertensive medications    Anemia chronic illness -Baseline Hgb 8.3 -Current Hgb 10.2- sl elevation 2/2 hemoconcentration from dehydration    Hyperlipidemia -Continue Lipitor    BPH without urinary obstruction -Flomax on hold secondary to orthostatic hypotension -Once euvolemic and BP improved will need to resume Flomax given need for narcotic analgesic ie increased risk for urinary retention      DVT prophylaxis: Lovenox Code Status: Full  Family Communication: Wife  Disposition Plan: Home Consults called: ID/Campbell     Zayla Agar L. ANP-BC [facility] Pager [phone]   If 7PM-7AM, please contact night-coverage [email or web address] Password [email or web address]  03/10/2017,  3:22 PM

## 2017-03-10 NOTE — Progress Notes (Signed)
Pt arrived from home via personal vehicle direct admit from infectious disease. Pt alert and oriented x4, pt and family oriented to room pt had diabetic ulcer on right medial ankle and blister on right lateral ankle MD paged for orders call light in place and bed alarm on

## 2017-03-10 NOTE — Progress Notes (Addendum)
[facility]  Patient Active Problem List   Diagnosis Date Noted  . 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 Osteomyelitis of ankle, right, acute (HCC) 11/27/2016    Priority: High  . Hypertension 03/10/2017  . Acute kidney injury (HCC) 03/10/2017  . Acute hyperkalemia 03/10/2017  . Anemia, chronic disease 03/10/2017  . Type II diabetes mellitus (HCC) 01/09/2017  . HTN (hypertension) 01/09/2017  . Neuropathy 01/09/2017  . BPH (benign prostatic hyperplasia) 01/09/2017  . Anemia 12/26/2016  . Diabetic ulcer of right foot associated with diabetes mellitus due to underlying condition (HCC) 03/31/2016  . LPRD (laryngopharyngeal reflux disease) 09/19/2014  . Hypercholesterolemia 08/01/2014  . Diabetic polyneuropathy associated with type 1 diabetes mellitus (HCC) 07/24/2014    Patient's Medications  New Prescriptions   No medications on file  Previous Medications   ACETAMINOPHEN (TYLENOL) 500 MG TABLET    Take 500 mg by mouth every 6 (six) hours as needed.   ASPIRIN 81 MG TABLET    Take 81 mg by mouth daily.   ATORVASTATIN (LIPITOR) 40 MG TABLET    Take 40 mg by mouth daily.   CEFAZOLIN (ANCEF) 1 G INJECTION    Inject 2,000 mg (2 g total) into the vein every 8 (eight) hours.   CIPROFLOXACIN (CIPRO) 500 MG TABLET    Take 1 tablet (500 mg total) by mouth 2 (two) times daily.   FERROUS SULFATE 325 (65 FE) MG TABLET    Take 325 mg by mouth 2 (two) times daily with a meal.    GABAPENTIN (NEURONTIN) 600 MG TABLET    Take 1,800 mg by mouth 3 (three) times daily.    IBUPROFEN (ADVIL,MOTRIN) 800 MG TABLET    Take 1 tablet (800 mg total) by mouth every 8 (eight) hours as needed.   INSULIN ASPART (NOVOLOG) 100 UNIT/ML INJECTION    Inject 10 Units into the skin 3 (three) times daily before meals. Per sliding scale   INSULIN GLARGINE (BASAGLAR KWIKPEN) 100 UNIT/ML SOPN    Inject 24-94 Units into the skin See admin instructions. 24  units in the morning and 94 units at bedtime   LOSARTAN (COZAAR) 50 MG TABLET    Take 50 mg by mouth daily.   PAROXETINE (PAXIL) 20 MG TABLET    Take 20 mg by mouth daily.    POLYETHYLENE GLYCOL (MIRALAX / GLYCOLAX) PACKET    Take 17 g by mouth daily. Mix one tablespoon with 8oz of your favorite juice or water every day until you are having soft formed stools. Then start taking once daily if you didn't have a stool the day before.   SULFAMETHOXAZOLE-TRIMETHOPRIM (BACTRIM DS,SEPTRA DS) 800-160 MG TABLET    Take 1 tablet by mouth 2 (two) times daily.   TAMSULOSIN (FLOMAX) 0.4 MG CAPS CAPSULE    TAKE ONE CAPSULE BY MOUTH DAILY   TRAMADOL (ULTRAM) 50 MG TABLET    Take 1 tablet (50 mg total) by mouth every 8 (eight) hours as needed.  Modified Medications   No medications on file  Discontinued Medications   No medications on file    Subjective: Keith [name] is in with his wife, Keith [name], for his routine follow-up visit. He has diabetes and developed septic arthritis of his right ankle. He underwent surgery on 12/25/2016. Operative cultures grew MSSA. He was on amoxicillin postoperatively until he saw me on 01/09/2017. I switched him to IV cefazolin which  he started on 01/13/2017. He completed 6 weeks of therapy on 02/24/2017. He was doing well at that time. However, very shortly after completing antibiotics he began having increased swelling and drainage from his ankle. He has had severe shooting pain. He saw his podiatrist, Dr. Sanjuana Letters, on 03/02/2017. Wound drainage cultures grew MSSA again. He was started on oral trimethoprim sulfamethoxazole. Oral ciprofloxacin was added on 03/04/2017. He is extremely weak. He has no appetite and has been losing a great deal of weight. He believes he has lost a little over 20 pounds.  Review of Systems: Review of Systems  Constitutional: Positive for malaise/fatigue and weight loss. Negative for chills, diaphoresis and fever.  Respiratory: Negative for cough.     Cardiovascular: Negative for chest pain.  Gastrointestinal: Negative for abdominal pain, diarrhea, nausea and vomiting.  Musculoskeletal: Positive for joint pain.  Skin: Negative for rash.  Neurological: Positive for weakness.    Past Medical History:  Diagnosis Date  . Diabetic peripheral neuropathy (HCC)   . Hyperlipidemia   . Hypertension   . Type I diabetes mellitus (HCC)     Social History  Substance Use Topics  . Smoking status: Never Smoker  . Smokeless tobacco: Never Used  . Alcohol use No    Family History  Problem Relation Age of Onset  . Prostate cancer Father   . Bladder Cancer Neg Hx   . Kidney cancer Neg Hx     Allergies  Allergen Reactions  . Daptomycin Anaphylaxis and Shortness Of Breath    Breathing issues  . Lisinopril Other (See Comments)    Cough    Objective: Vitals:   03/10/17 0838  BP: 94/61  Pulse: (!) 121  Temp: (!) 97.5 F (36.4 C)  TempSrc: Oral  Weight: 226 lb (102.5 kg)   Body mass index is 30.65 kg/m.  Physical Exam  Constitutional: He is oriented to person, place, and time.  He is very uncomfortable. He appears ashen.  Cardiovascular: Regular rhythm.   Murmur heard. Tachycardic.  Pulmonary/Chest: Effort normal and breath sounds normal.  Abdominal: Soft. There is no tenderness.  Musculoskeletal:  His right ankle is swollen and red. There is a silver dollar size open wound medially that is bleeding. There is green drainage on the gauze dressing. There is a small pustule laterally.  Neurological: He is alert and oriented to person, place, and time.  Skin: No rash noted.  Psychiatric: Mood and affect normal.    Lab Results Right ankle wound culture 03/02/2017: MSSA Sed Rate (mm/hr)  Date Value  03/10/2017 123 (H)  01/09/2017 71 (H)  12/12/2016 103 (H)   CRP (mg/L)  Date Value  01/09/2017 27.3 (H)     Problem List Items Addressed This Visit      High   Septic arthritis of ankle or foot [facility])    Keith [name]  has persistent, severe infection of his right ankle. I discussed management options with him and believe that it is in his best interest to be admitted directly to the hospital today. I would like to get a repeat MRI. He already has an appointment scheduled at [facility] today for PICC placement. I will start him back on cefazolin 2 g IV every 8 hours. He needs a podiatry/orthopedic consultation.          Cliffton Asters, MD [facility] [phone] pager   [phone] cell 03/11/2017, 11:14 AM

## 2017-03-10 NOTE — Assessment & Plan Note (Addendum)
Keith [name] has persistent, severe infection of his right ankle. I discussed management options with him and believe that it is in his best interest to be admitted directly to the hospital today. I would like to get a repeat MRI. He already has an appointment scheduled at [facility] today for PICC placement. I will start him back on cefazolin 2 g IV every 8 hours. He needs a podiatry/orthopedic consultation.

## 2017-03-11 ENCOUNTER — Telehealth: Payer: Self-pay | Admitting: *Deleted

## 2017-03-11 ENCOUNTER — Inpatient Hospital Stay (HOSPITAL_COMMUNITY): Payer: BC Managed Care – PPO

## 2017-03-11 DIAGNOSIS — M86171 Other acute osteomyelitis, right ankle and foot: Secondary | ICD-10-CM

## 2017-03-11 LAB — BASIC METABOLIC PANEL
ANION GAP: 8 (ref 5–15)
BUN: 37 mg/dL — ABNORMAL HIGH (ref 6–20)
CALCIUM: 10.2 mg/dL (ref 8.9–10.3)
CHLORIDE: 104 mmol/L (ref 101–111)
CO2: 23 mmol/L (ref 22–32)
Creatinine, Ser: 1.6 mg/dL — ABNORMAL HIGH (ref 0.61–1.24)
GFR calc non Af Amer: 50 mL/min — ABNORMAL LOW (ref >60)
GFR, EST AFRICAN AMERICAN: 58 mL/min — AB (ref >60)
Glucose, Bld: 105 mg/dL — ABNORMAL HIGH (ref 65–99)
Potassium: 6.2 mmol/L — ABNORMAL HIGH (ref 3.5–5.1)
Sodium: 135 mmol/L (ref 135–145)

## 2017-03-11 LAB — CBC
HEMATOCRIT: 30.7 % — AB (ref 39.0–52.0)
HEMOGLOBIN: 9.4 g/dL — AB (ref 13.0–17.0)
MCH: 24.4 pg — ABNORMAL LOW (ref 26.0–34.0)
MCHC: 30.6 g/dL (ref 30.0–36.0)
MCV: 79.7 fL (ref 78.0–100.0)
Platelets: 369 10*3/uL (ref 150–400)
RBC: 3.85 MIL/uL — AB (ref 4.22–5.81)
RDW: 19.6 % — ABNORMAL HIGH (ref 11.5–15.5)
WBC: 10.8 10*3/uL — AB (ref 4.0–10.5)

## 2017-03-11 LAB — GLUCOSE, CAPILLARY
GLUCOSE-CAPILLARY: 78 mg/dL (ref 65–99)
GLUCOSE-CAPILLARY: 105 mg/dL — AB (ref 65–99)
GLUCOSE-CAPILLARY: 135 mg/dL — AB (ref 65–99)
GLUCOSE-CAPILLARY: 183 mg/dL — AB (ref 65–99)
Glucose-Capillary: 74 mg/dL (ref 65–99)
Glucose-Capillary: 76 mg/dL (ref 65–99)
Glucose-Capillary: 107 mg/dL — ABNORMAL HIGH (ref 65–99)

## 2017-03-11 LAB — POTASSIUM: Potassium: 6.1 mmol/L — ABNORMAL HIGH (ref 3.5–5.1)

## 2017-03-11 MED ORDER — TAMSULOSIN HCL 0.4 MG PO CAPS
0.4000 mg | ORAL_CAPSULE | Freq: Every day | ORAL | Status: DC
  Administered 2017-03-11 – 2017-03-21 (×11): 0.4 mg via ORAL
  Filled 2017-03-11 (×11): qty 1

## 2017-03-11 MED ORDER — POVIDONE-IODINE 7.5 % EX SOLN
Freq: Once | CUTANEOUS | Status: AC
  Administered 2017-03-12: 07:00:00 via TOPICAL
  Filled 2017-03-11: qty 118

## 2017-03-11 MED ORDER — SODIUM CHLORIDE 0.9 % IV SOLN
1.0000 g | INTRAVENOUS | Status: AC
  Administered 2017-03-11: 1 g via INTRAVENOUS
  Filled 2017-03-11: qty 10

## 2017-03-11 MED ORDER — GADOBENATE DIMEGLUMINE 529 MG/ML IV SOLN
20.0000 mL | Freq: Once | INTRAVENOUS | Status: AC | PRN
  Administered 2017-03-11: 20 mL via INTRAVENOUS

## 2017-03-11 MED ORDER — INSULIN GLARGINE 100 UNIT/ML ~~LOC~~ SOLN
70.0000 [IU] | Freq: Every day | SUBCUTANEOUS | Status: DC
  Administered 2017-03-11: 70 [IU] via SUBCUTANEOUS
  Filled 2017-03-11: qty 0.7

## 2017-03-11 MED ORDER — INSULIN ASPART 100 UNIT/ML IV SOLN
10.0000 [IU] | INTRAVENOUS | Status: AC
  Administered 2017-03-11: 10 [IU] via INTRAVENOUS

## 2017-03-11 MED ORDER — DEXTROSE 50 % IV SOLN
25.0000 mL | Freq: Once | INTRAVENOUS | Status: AC
  Administered 2017-03-11: 25 mL via INTRAVENOUS
  Filled 2017-03-11: qty 50

## 2017-03-11 MED ORDER — POVIDONE-IODINE 7.5 % EX SOLN
Freq: Once | CUTANEOUS | Status: DC
  Filled 2017-03-11: qty 118

## 2017-03-11 MED ORDER — INSULIN GLARGINE 100 UNIT/ML ~~LOC~~ SOLN
75.0000 [IU] | Freq: Every day | SUBCUTANEOUS | Status: DC
  Filled 2017-03-11: qty 0.75

## 2017-03-11 NOTE — Progress Notes (Addendum)
Inpatient Diabetes Program Recommendations  AACE/ADA: New Consensus Statement on Inpatient Glycemic Control (2015)  Target Ranges:  Prepandial:   less than 140 mg/dL      Peak postprandial:   less than 180 mg/dL (1-2 hours)      Critically ill patients:  140 - 180 mg/dL   Results for [name], Keith E (MRN [id number]) as of 03/11/2017 12:32  Ref. Range 03/11/2017 07:45 03/11/2017 08:07 03/11/2017 08:28 03/11/2017 09:35 03/11/2017 12:21  Glucose-Capillary Latest Ref Range: 65 - 99 mg/dL 74 76 78 105 (H) 107 (H)   Results for [name], Keith E (MRN [id number]) as of 03/11/2017 12:32  Ref. Range 04/01/2016 05:11 03/10/2017 12:27  Hemoglobin A1C Latest Ref Range: 4.8 - 5.6 % 13.9 (H) 6.2 (H)    Admit with: Osteomyelitis of ankle  History: DM  Home DM Meds: Basaglar 24 units AM/ 94 units PM       Novolog 15 units TID with meals  Current Insulin Orders: Lantus 75 units QHS      Lantus 20 units QAM         Novolog Moderate Correction Scale/ SSI (0-15 units) TID AC + HS       MD- Recommend the following:  1. Stop Lantus 20 units daily in the AM for now (patient received 80 units Lantus last PM and CBG this AM was only 74 mg/dl).  2. Reduce PM dose of Lantus to 70 units QHS    Met with patient this AM.  Patient told me his wife has been helping him manage his diabetes for the last several months (she has been giving injections and trying to help with dosing of insulin).  Reviewed Current A1c results with patient and wife (wife was on speaker phone on pt's cell phone).  Patient and wife were ecstatic to learn that pt's current A1c is 6.2% (was 13.9% last year in September).  Pt's wife told me that she has been giving the [place] consistently and that she often does not give more than 12 units Novolog with meals.  Tends to check CBGs 20-30 minutes after meals.  I discussed with pt and wife that it is not common practice to check CBGs once the patient has started eating as glucose levels will rise  quickly with food intake.  Suggested pre-meal and 2-hour post-prandial checks to help get a better idea on how food is affecting CBGs and to see if dosage adjustments need to be made to insulin.  Congratulated pt and his wife for such a remarkable improvement in A1c level.  Pt and wife state pt does not have Hypoglycemic episodes at home.  Pt and wife had questions about insulin pumps.  Gave pt and wife very brief information on pumps and encouraged them to seek care under Endocrinology practice if they desire to start pump therapy.  Also gave pt information on the [facility] continuous glucose sensor to help reduce the burden of fingerstick CBGs.  Pt stated he would be very interested in obtaining a glucose sensor.     --Will follow patient during hospitalization--  Wyn Quaker RN, MSN, CDE Diabetes Coordinator Inpatient Glycemic Control Team Team Pager: [phone] (8a-5p)

## 2017-03-11 NOTE — Progress Notes (Signed)
[facility]  Date of Admission:  03/10/2017           Day 2 cefazolin ASSESSMENT: He has severe right ankle infection. I'm hopeful that he will get the MRI this afternoon followed by reevaluation by his podiatrist, Dr. Logan Bores.  PLAN: 1. Continue cefazolin 2. Await results of MRI  Principal Problem:   Osteomyelitis of ankle, right, acute (HCC) Active Problems:   Type II diabetes mellitus (HCC)   HTN (hypertension)   Hypertension   Acute kidney injury (HCC)   Acute hyperkalemia   Anemia, chronic disease   . aspirin EC  81 mg Oral Daily  . atorvastatin  40 mg Oral q1800  . enoxaparin (LOVENOX) injection  40 mg Subcutaneous Daily  . ferrous sulfate  325 mg Oral TID WC  . gabapentin  1,800 mg Oral TID  . insulin aspart  0-15 Units Subcutaneous TID WC  . insulin aspart  0-5 Units Subcutaneous QHS  . insulin glargine  20 Units Subcutaneous Daily  . insulin glargine  75 Units Subcutaneous QHS  . PARoxetine  20 mg Oral Daily  . tamsulosin  0.4 mg Oral Daily    SUBJECTIVE: He is feeling a little better today. His pain is under better control. They offered to do his MRI shortly after midnight last night but he refused stating that he felt like a pin cushion. He is very apologetic for refusing.  Review of Systems: Review of Systems  Constitutional: Positive for fever, malaise/fatigue and weight loss. Negative for chills and diaphoresis.  Gastrointestinal: Negative for abdominal pain, diarrhea, nausea and vomiting.  Musculoskeletal: Positive for joint pain.  Skin: Negative for rash.    Allergies  Allergen Reactions  . Daptomycin Anaphylaxis and Shortness Of Breath    Breathing issues  . Lisinopril Other (See Comments)    Cough    OBJECTIVE: Vitals:   03/10/17 1741 03/10/17 2157 03/11/17 0529 03/11/17 0612  BP: (!) 120/52 139/70 (!) 128/59   Pulse:  (!) 108 (!) 109   Resp:  20 20   Temp:  99.2 F (37.3 C) 100.2 F (37.9 C)     TempSrc:  Oral Oral   SpO2:  95% 96%   Weight:    232 lb 6.4 oz (105.4 kg)  Height:    6' (1.829 m)   Body mass index is 31.52 kg/m.  Physical Exam  Constitutional: He is oriented to person, place, and time.  He looks much better today. His color has improved. He is in better spirits.  Cardiovascular: Regular rhythm.   No murmur heard. He is still mildly tachycardic.  Pulmonary/Chest: Effort normal and breath sounds normal.  Abdominal: Soft. There is no tenderness.  Musculoskeletal:  His right foot and ankle is slightly less swollen and red than yesterday. He has some new superficial blistering over the dorsum of the foot. The pustule on the lateral side of his foot is now unroofed. The medial ankle wound remains beefy red with bloody drainage.  Neurological: He is alert and oriented to person, place, and time.  Skin: No rash noted.  Psychiatric: Mood and affect normal.    Lab Results Lab Results  Component Value Date   WBC 10.8 (H) 03/11/2017   HGB 9.4 (L) 03/11/2017   HCT 30.7 (L) 03/11/2017   MCV 79.7 03/11/2017   PLT 369 03/11/2017    Lab Results  Component Value Date   CREATININE 1.60 (H) 03/11/2017  BUN 37 (H) 03/11/2017   NA 135 03/11/2017   K 6.2 (H) 03/11/2017   CL 104 03/11/2017   CO2 23 03/11/2017    Lab Results  Component Value Date   ALT 69 (H) 03/10/2017   AST 72 (H) 03/10/2017   ALKPHOS 104 03/10/2017   BILITOT 0.7 03/10/2017     Microbiology: Recent Results (from the past 240 hour(s))  WOUND CULTURE     Status: None   Collection Time: 03/02/17  3:00 PM  Result Value Ref Range Status   Culture Abundant STAPHYLOCOCCUS AUREUS  Final   Gram Stain No WBC Seen  Final   Gram Stain No Squamous Epithelial Cells Seen  Final   Gram Stain No Organisms Seen  Final   Organism ID, Bacteria STAPHYLOCOCCUS AUREUS  Final    Comment: Rifampin and Gentamicin should not be used as single drugs for treatment of Staph infections.       Susceptibility    Staphylococcus aureus -  (no method available)    OXACILLIN 0.5 Sensitive     CEFAZOLIN  Sensitive     GENTAMICIN <=0.5 Sensitive     CIPROFLOXACIN <=0.5 Sensitive     LEVOFLOXACIN 0.25 Sensitive     MOXIFLOXACIN <=0.25 Sensitive     TRIMETH/SULFA <=10 Sensitive     VANCOMYCIN <=0.5 Sensitive     CLINDAMYCIN <=0.25 Sensitive     ERYTHROMYCIN <=0.25 Sensitive     LINEZOLID 2 Sensitive     QUINUPRISTIN/DALF <=0.25 Sensitive     RIFAMPIN <=0.5 Sensitive     TETRACYCLINE <=1 Sensitive   Culture, blood (Routine X 2) w Reflex to ID Panel     Status: None (Preliminary result)   Collection Time: 03/10/17 12:23 PM  Result Value Ref Range Status   Specimen Description BLOOD RIGHT ANTECUBITAL  Final   Special Requests IN PEDIATRIC BOTTLE Blood Culture adequate volume  Final   Culture NO GROWTH < 24 HOURS  Final   Report Status PENDING  Incomplete  Culture, blood (Routine X 2) w Reflex to ID Panel     Status: None (Preliminary result)   Collection Time: 03/10/17 12:31 PM  Result Value Ref Range Status   Specimen Description BLOOD RIGHT ANTECUBITAL  Final   Special Requests IN PEDIATRIC BOTTLE Blood Culture adequate volume  Final   Culture NO GROWTH < 24 HOURS  Final   Report Status PENDING  Incomplete    Cliffton AstersJohn Rebbecca Osuna, MD [facility] [phone] pager   [phone] cell 03/11/2017, 1:05 PM

## 2017-03-11 NOTE — Consult Note (Signed)
CONSULTATION: Podiatry REASON FOR CONSULT: RT ankle infection  HPI:  46 year old male with a history of uncontrolled diabetes mellitus presents as an inpatient for evaluation of right ankle joint sepsis and cellulitis to the soft tissue structures of the ankle. Patient has been seen multiple times in office and is well known to my practice for treatment of a right ankle ulcer which has demonstrated routine healing up through 02/24/2017. Patient was last seen in my office 03/02/2017 which time there was rapid degenerative changes to the ulceration site. Patient was instructed to immediately follow up with infectious disease Dr. Orvan Falconerampbell, and rapid degeneration of the ulcer with cellulitis and sedated and the patient was subsequently admitted directly [facility] facility. Patient presents today at bedside for further treatment and evaluation regarding the right ankle cellulitis with septic joint.  Patient Active Problem List   Diagnosis Date Noted  . Hypertension 03/10/2017  . Acute kidney injury (HCC) 03/10/2017  . Acute hyperkalemia 03/10/2017  . Anemia, chronic disease 03/10/2017  . Septic arthritis of ankle or foot (HCC) 01/09/2017  . Type II diabetes mellitus (HCC) 01/09/2017  . HTN (hypertension) 01/09/2017  . Neuropathy 01/09/2017  . BPH (benign prostatic hyperplasia) 01/09/2017  . Anemia 12/26/2016  . Osteomyelitis of ankle, right, acute (HCC) 11/27/2016  . Diabetic ulcer of right foot associated with diabetes mellitus due to underlying condition (HCC) 03/31/2016  . LPRD (laryngopharyngeal reflux disease) 09/19/2014  . Hypercholesterolemia 08/01/2014  . Diabetic polyneuropathy associated with type 1 diabetes mellitus (HCC) 07/24/2014    CBC    Component Value Date/Time   WBC 10.8 (H) 03/11/2017 0440   RBC 3.85 (L) 03/11/2017 0440   HGB 9.4 (L) 03/11/2017 0440   HCT 30.7 (L) 03/11/2017 0440   PLT 369 03/11/2017 0440   MCV 79.7 03/11/2017 0440   MCH 24.4 (L)  03/11/2017 0440   MCHC 30.6 03/11/2017 0440   RDW 19.6 (H) 03/11/2017 0440   LYMPHSABS 0.9 03/10/2017 1227   MONOABS 0.6 03/10/2017 1227   EOSABS 0.2 03/10/2017 1227   BASOSABS 0.0 03/10/2017 1227    Past Medical History:  Diagnosis Date  . Diabetic peripheral neuropathy (HCC)   . Hyperlipidemia   . Hypertension   . Type I diabetes mellitus (HCC)     Physical Exam: General: The patient is alert and oriented x3 in no acute distress.  Dermatology: Ulcer noted to the medial aspect of the right ankle with a heavy amount of purulent drainage and localized erythema around the ankle and foot structures of the foot. Ulcer measures approximately 3.52.5 extending to the ankle and subtalar joint.  Vascular: Neurovascular status intact.   Neurological: Epicritic and protective threshold diminished bilaterally.   Musculoskeletal Exam: Severe pain on palpation noted throughout the soft tissue structures of the right ankle.   MRI Impression:   1. Diffuse osteomyelitis involving all of the bones of the the hindfoot and midfoot, as well as the bases of the second through fifth metatarsals, as described above, with chronic fracture through the talar neck. 2. Tibiotalar and subtalar joint septic arthritis with large, loculated abscesses along the medial and lateral ankle, which communicate with the tibiotalar joint. The larger fluid collection along the medial ankle measures up to 6.6 cm. 3. Small amount of fluid in the peroneal tendon sheath and distal peroneus brevis tendon sheath, concerning for infectious tenosynovitis. Likely infectious tenosynovitis of the flexor hallucis longus as well.  Assessment: 1. Right ankle and subtalar joint sepsis complicated by diabetes  mellitus 2. Cellulitis right lower extremity with multiple abscesses 3. Bone marrow edema osseous structures of the foot and ankle right lower extremity  Plan of Care:  1. Patient was evaluated. MRI reviewed today with  the patient 2. Today we're going to plan incision and drainage of the right ankle and subtalar joint along with multiple abscesses within the soft tissues of the foot. The patient understands this is a salvage procedure. Complications and details were explained. No guarantees were expressed or implied. 3. Preoperative orders placed today. Patient will be nothing by mouth after midnight. Surgery tentatively planned for tomorrow afternoon 03/12/2017   Felecia Shelling, DPM [facility]  Dr. Felecia Shelling, DPM    [address] [place], [place] [address]                Office [phone]  Fax [phone]

## 2017-03-11 NOTE — Telephone Encounter (Addendum)
Dr. Isidoro Donning [facility] attending physician, is going for MRI and states pt's foot looks very bad and she would like Dr. Logan Bores to see in hospital room [address]. I also asked Dr. Isidoro Donning to order referral for Dr. Logan Bores.03/30/2017-[name] - [facility] called for orders, states pt has extreme amount of serosanguinous drainage and she is requesting wound vac, pt has an appt tomorrow.

## 2017-03-11 NOTE — Progress Notes (Signed)
[facility]                                                                              Patient Demographics  Keith [name], is a 46 y.o. male, DOB - 03/05/1971, NGE:[id number]  Admit date - 03/10/2017   Admitting Physician Ozella Rocksavid J Merrell, MD  Outpatient Primary MD for the patient is [facility]  Outpatient specialists:   LOS - 1  days   Medical records reviewed and are as summarized below:    No chief complaint on file.      Brief summary   Keith [name] is a 46 y.o. male with medical history significant for diabetes mellitus 2 on insulin, obesity, BPH on Flomax, hypertension, dyslipidemia, peripheral neuropathy, and septic arthritis of right ankle. The patient [facility] for septic right ankle. At that time there was no open wound. Podiatrist recommended to discontinue antibiotics given during the hospitalization. He had previously been treated with Bactrim for 2 weeks. Unfortunately the ankle became worse and he developed an open wound. He also underwent intraoperative I&D on 6/14 with cultures being positive for MSSA and he was started on amoxicillin. He was evaluated on 6/29 by ID and changed to IV cefazolin via PICC line with this drug actually initiated on 7/3. He completed 6 weeks of antibiotics on 8/14. Shortly after completing these antibiotics he noticed increased swelling and drainage from the ankle and increasing severe shooting type pains. He followed up with his podiatrist on 8/20 and wound cultures again were positive for MSSA. He was started on Bactrim. Oral ciprofloxacin was added on 8/22. Today he followed up with the ID clinic/Dr. Orvan Falconerampbell complaining of extreme weakness, no appetite, and weight loss of 20 pounds. Clinically he appeared pale and was tachycardic with SBP in the 90s. He was sent directly from the office to the hospital for admission. Of note the ankle wound which had nearly healed from the previous surgery and  antibiotic treatment has now opened and is at least 3 x 4 cm.   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, per patient became significantly worse after finishing IV antibiotics - ID following, recommended to continue IV Ancef  - Follow MRI of the right foot, rule out osteomyelitis - Podiatry, Dr Logan BoresEvans notified, will likely need I&D in OR   Active Problems:   Type II diabetes mellitus (HCC)  - Now having low blood sugar this morning, decreased qhs lantus to 75units, may need to decrease further AM and PM lantus. Held lantus 20units this am - BS in 70's this a.m.    HTN (hypertension) - BP soft, continue to hold antihypertensives    Acute kidney injury (HCC)acute hyperkalemia, metabolic acidosis - Secondary to dehydration, hypoperfusion, NSAIDs and ARB - Continue to hold offending medications, aggressive IV fluid hydration - potassium 6.2, gave calcium gluconate 1, Insulin + dextrose protocol     Acute hyperkalemia - possibly due to NSAIDS, ARB - Continue telemetry, potassium 6.2, gave calcium gluconate 1, Insulin + dextrose  protocol     Anemia, chronic disease - currently stable, continue close monitoring, baseline hemoglobin 8.3  Hyperlipidemia - Continue Lipitor  BPH, no urinary obstruction - BP stable, restart Flomax  Code Status:  full DVT Prophylaxis:  Lovenox  Family Communication: Discussed in detail with the patient, all imaging results, lab results explained to the patient and wife    Disposition Plan:   Time Spent in minutes   25 minutes  Procedures:    Consultants:   Infectious disease Podiatry  Antimicrobials:   IV Ancef, 8/28 >   Medications  Scheduled Meds: . aspirin EC  81 mg Oral Daily  . atorvastatin  40 mg Oral q1800  . insulin aspart  10 Units Intravenous STAT   Followed by  . dextrose  25 mL Intravenous Once  . enoxaparin (LOVENOX)  injection  40 mg Subcutaneous Daily  . ferrous sulfate  325 mg Oral TID WC  . gabapentin  1,800 mg Oral TID  . insulin aspart  0-15 Units Subcutaneous TID WC  . insulin aspart  0-5 Units Subcutaneous QHS  . insulin glargine  20 Units Subcutaneous Daily  . insulin glargine  75 Units Subcutaneous QHS  . PARoxetine  20 mg Oral Daily   Continuous Infusions: . sodium chloride 150 mL/hr at 03/11/17 0503  .  ceFAZolin (ANCEF) IV Stopped (03/11/17 0533)   PRN Meds:.acetaminophen, morphine injection, ondansetron **OR** ondansetron (ZOFRAN) IV, oxyCODONE   Antibiotics   Anti-infectives    Start     Dose/Rate Route Frequency Ordered Stop   03/10/17 1230  ceFAZolin (ANCEF) IVPB 2g/100 mL premix     2 g 200 mL/hr over 30 Minutes Intravenous Every 8 hours 03/10/17 1158          Subjective:   Keith [name] was seen and examined today. Complaining of pain in the right foot, 7/10, sharp. Refused MRI overnight.  Patient denies dizziness, chest pain, shortness of breath, abdominal pain, N/V/D/C, new weakness, numbess, tingling. Spiking low-grade temperature 100.68F  Objective:   Vitals:   03/10/17 1741 03/10/17 2157 03/11/17 0529 03/11/17 0612  BP: (!) 120/52 139/70 (!) 128/59   Pulse:  (!) 108 (!) 109   Resp:  20 20   Temp:  99.2 F (37.3 C) 100.2 F (37.9 C)   TempSrc:  Oral Oral   SpO2:  95% 96%   Weight:    105.4 kg (232 lb 6.4 oz)  Height:    6' (1.829 m)    Intake/Output Summary (Last 24 hours) at 03/11/17 1040 Last data filed at 03/11/17 0907  Gross per 24 hour  Intake             2790 ml  Output             2600 ml  Net              190 ml     Wt Readings from Last 3 Encounters:  03/11/17 105.4 kg (232 lb 6.4 oz)  03/10/17 102.5 kg (226 lb)  02/19/17 109.8 kg (242 lb)     Exam  General: Alert and oriented x 3, NAD  Eyes:   HEENT:  Atraumatic, normocephalic  Cardiovascular: S1 S2 auscultated, no rubs, murmurs or gallops. Regular rate and  rhythm.  Respiratory: Clear to auscultation bilaterally, no wheezing, rales or rhonchi  Gastrointestinal: Soft, nontender, nondistended, + bowel sounds  Ext: 1+ pedal edema bilaterally  Neuro: AAOx3, Cr N's II- XII. Strength 5/5 upper and lower extremities bilaterally, speech  clear, sensations grossly intact  Musculoskeletal: no cyanosis or clubbing  Skin: right foot dressing intact   Psych: Normal affect and demeanor, alert and oriented x3    Data Reviewed:  I have personally reviewed following labs and imaging studies  Micro Results Recent Results (from the past 240 hour(s))  WOUND CULTURE     Status: None   Collection Time: 03/02/17  3:00 PM  Result Value Ref Range Status   Culture Abundant STAPHYLOCOCCUS AUREUS  Final   Gram Stain No WBC Seen  Final   Gram Stain No Squamous Epithelial Cells Seen  Final   Gram Stain No Organisms Seen  Final   Organism ID, Bacteria STAPHYLOCOCCUS AUREUS  Final    Comment: Rifampin and Gentamicin should not be used as single drugs for treatment of Staph infections.       Susceptibility   Staphylococcus aureus -  (no method available)    OXACILLIN 0.5 Sensitive     CEFAZOLIN  Sensitive     GENTAMICIN <=0.5 Sensitive     CIPROFLOXACIN <=0.5 Sensitive     LEVOFLOXACIN 0.25 Sensitive     MOXIFLOXACIN <=0.25 Sensitive     TRIMETH/SULFA <=10 Sensitive     VANCOMYCIN <=0.5 Sensitive     CLINDAMYCIN <=0.25 Sensitive     ERYTHROMYCIN <=0.25 Sensitive     LINEZOLID 2 Sensitive     QUINUPRISTIN/DALF <=0.25 Sensitive     RIFAMPIN <=0.5 Sensitive     TETRACYCLINE <=1 Sensitive   Culture, blood (Routine X 2) w Reflex to ID Panel     Status: None (Preliminary result)   Collection Time: 03/10/17 12:23 PM  Result Value Ref Range Status   Specimen Description BLOOD RIGHT ANTECUBITAL  Final   Special Requests IN PEDIATRIC BOTTLE Blood Culture adequate volume  Final   Culture NO GROWTH < 24 HOURS  Final   Report Status PENDING  Incomplete   Culture, blood (Routine X 2) w Reflex to ID Panel     Status: None (Preliminary result)   Collection Time: 03/10/17 12:31 PM  Result Value Ref Range Status   Specimen Description BLOOD RIGHT ANTECUBITAL  Final   Special Requests IN PEDIATRIC BOTTLE Blood Culture adequate volume  Final   Culture NO GROWTH < 24 HOURS  Final   Report Status PENDING  Incomplete    Radiology Reports No results found.  Lab Data:  CBC:  Recent Labs Lab 03/10/17 1227 03/11/17 0440  WBC 13.8* 10.8*  NEUTROABS 12.1*  --   HGB 10.2* 9.4*  HCT 33.0* 30.7*  MCV 79.3 79.7  PLT 355 369   Basic Metabolic Panel:  Recent Labs Lab 03/10/17 1227 03/10/17 1747 03/11/17 0440  NA 132* 133* 135  K 6.6* 5.4* 6.2*  CL 103 104 104  CO2 20* 20* 23  GLUCOSE 92 153* 105*  BUN 56* 50* 37*  CREATININE 2.48* 1.94* 1.60*  CALCIUM 10.6* 10.2 10.2  MG 2.1  --   --    GFR: Estimated Creatinine Clearance: 72.4 mL/min (A) (by C-G formula based on SCr of 1.6 mg/dL (H)). Liver Function Tests:  Recent Labs Lab 03/10/17 1227  AST 72*  ALT 69*  ALKPHOS 104  BILITOT 0.7  PROT 8.2*  ALBUMIN 2.7*   No results for input(s): LIPASE, AMYLASE in the last 168 hours. No results for input(s): AMMONIA in the last 168 hours. Coagulation Profile: No results for input(s): INR, PROTIME in the last 168 hours. Cardiac Enzymes: No results for input(s): CKTOTAL, CKMB, CKMBINDEX, TROPONINI  in the last 168 hours. BNP (last 3 results) No results for input(s): PROBNP in the last 8760 hours. HbA1C:  Recent Labs  03/10/17 1227  HGBA1C 6.2*   CBG:  Recent Labs Lab 03/10/17 2157 03/11/17 0745 03/11/17 0807 03/11/17 0828 03/11/17 0935  GLUCAP 189* 74 76 78 105*   Lipid Profile: No results for input(s): CHOL, HDL, LDLCALC, TRIG, CHOLHDL, LDLDIRECT in the last 72 hours. Thyroid Function Tests: No results for input(s): TSH, T4TOTAL, FREET4, T3FREE, THYROIDAB in the last 72 hours. Anemia Panel: No results for  input(s): VITAMINB12, FOLATE, FERRITIN, TIBC, IRON, RETICCTPCT in the last 72 hours. Urine analysis:    Component Value Date/Time   COLORURINE GREEN (A) 11/06/2016 1758   APPEARANCEUR CLOUDY (A) 11/06/2016 1758   LABSPEC 1.018 11/06/2016 1758   PHURINE 5.0 11/06/2016 1758   GLUCOSEU NEGATIVE 11/06/2016 1758   HGBUR SMALL (A) 11/06/2016 1758   BILIRUBINUR NEGATIVE 11/06/2016 1758   KETONESUR NEGATIVE 11/06/2016 1758   PROTEINUR 100 (A) 11/06/2016 1758   NITRITE NEGATIVE 11/06/2016 1758   LEUKOCYTESUR LARGE (A) 11/06/2016 1758     Ripudeep Rai M.D. [facility] 03/11/2017, 10:40 AM  Pager: [phone] Between 7am to 7pm - call Pager - [phone]  After 7pm go to [email or web address] - password [email or web address]  Call night coverage person covering after 7pm

## 2017-03-12 ENCOUNTER — Inpatient Hospital Stay (HOSPITAL_COMMUNITY): Payer: BC Managed Care – PPO | Admitting: Anesthesiology

## 2017-03-12 ENCOUNTER — Encounter (HOSPITAL_COMMUNITY)
Admission: AD | Disposition: A | Discharge status: Home / Self Care | Payer: Self-pay | Source: Ambulatory Visit | Attending: Internal Medicine

## 2017-03-12 ENCOUNTER — Encounter (HOSPITAL_COMMUNITY): Payer: Self-pay | Admitting: Anesthesiology

## 2017-03-12 DIAGNOSIS — M86171 Other acute osteomyelitis, right ankle and foot: Secondary | ICD-10-CM

## 2017-03-12 HISTORY — PX: I&D EXTREMITY: SHX5045

## 2017-03-12 HISTORY — PX: I & D EXTREMITY: SHX5045

## 2017-03-12 LAB — BASIC METABOLIC PANEL
Anion gap: 6 (ref 5–15)
BUN: 27 mg/dL — ABNORMAL HIGH (ref 6–20)
CALCIUM: 10 mg/dL (ref 8.9–10.3)
CHLORIDE: 104 mmol/L (ref 101–111)
CO2: 22 mmol/L (ref 22–32)
CREATININE: 1.18 mg/dL (ref 0.61–1.24)
GFR calc Af Amer: >60 mL/min (ref >60)
GFR calc non Af Amer: >60 mL/min (ref >60)
GLUCOSE: 166 mg/dL — AB (ref 65–99)
Potassium: 6 mmol/L — ABNORMAL HIGH (ref 3.5–5.1)
Sodium: 132 mmol/L — ABNORMAL LOW (ref 135–145)

## 2017-03-12 LAB — SURGICAL PCR SCREEN
MRSA, PCR: NEGATIVE
Staphylococcus aureus: NEGATIVE

## 2017-03-12 LAB — CBC
HCT: 30.8 % — ABNORMAL LOW (ref 39.0–52.0)
HEMOGLOBIN: 9.3 g/dL — AB (ref 13.0–17.0)
MCH: 24.2 pg — AB (ref 26.0–34.0)
MCHC: 30.2 g/dL (ref 30.0–36.0)
MCV: 80.2 fL (ref 78.0–100.0)
PLATELETS: 360 10*3/uL (ref 150–400)
RBC: 3.84 MIL/uL — ABNORMAL LOW (ref 4.22–5.81)
RDW: 18.4 % — ABNORMAL HIGH (ref 11.5–15.5)
WBC: 10.7 10*3/uL — ABNORMAL HIGH (ref 4.0–10.5)

## 2017-03-12 LAB — GLUCOSE, CAPILLARY
GLUCOSE-CAPILLARY: 73 mg/dL (ref 65–99)
GLUCOSE-CAPILLARY: 110 mg/dL — AB (ref 65–99)
GLUCOSE-CAPILLARY: 115 mg/dL — AB (ref 65–99)
GLUCOSE-CAPILLARY: 116 mg/dL — AB (ref 65–99)
Glucose-Capillary: 146 mg/dL — ABNORMAL HIGH (ref 65–99)
Glucose-Capillary: 140 mg/dL — ABNORMAL HIGH (ref 65–99)
Glucose-Capillary: 320 mg/dL — ABNORMAL HIGH (ref 65–99)

## 2017-03-12 LAB — POCT I-STAT 4, (NA,K, GLUC, HGB,HCT)
GLUCOSE: 121 mg/dL — AB (ref 65–99)
HCT: 31 % — ABNORMAL LOW (ref 39.0–52.0)
HEMOGLOBIN: 10.5 g/dL — AB (ref 13.0–17.0)
Potassium: 5.3 mmol/L — ABNORMAL HIGH (ref 3.5–5.1)
Sodium: 137 mmol/L (ref 135–145)

## 2017-03-12 SURGERY — IRRIGATION AND DEBRIDEMENT EXTREMITY
Anesthesia: General | Site: Ankle | Laterality: Right

## 2017-03-12 MED ORDER — LIDOCAINE 2% (20 MG/ML) 5 ML SYRINGE: INTRAMUSCULAR | Status: DC | PRN

## 2017-03-12 MED ORDER — ONDANSETRON HCL 4 MG/2ML IJ SOLN
INTRAMUSCULAR | Status: AC
  Filled 2017-03-12: qty 2

## 2017-03-12 MED ORDER — MIDAZOLAM HCL 5 MG/5ML IJ SOLN
INTRAMUSCULAR | Status: DC | PRN
  Administered 2017-03-12: 2 mg via INTRAVENOUS

## 2017-03-12 MED ORDER — CALCIUM GLUCONATE 10 % IV SOLN
1.0000 g | INTRAVENOUS | Status: AC
  Administered 2017-03-12: 1 g via INTRAVENOUS
  Filled 2017-03-12: qty 10

## 2017-03-12 MED ORDER — MORPHINE SULFATE (PF) 2 MG/ML IV SOLN
2.0000 mg | INTRAVENOUS | Status: DC | PRN
  Administered 2017-03-12: 2 mg via INTRAVENOUS
  Filled 2017-03-12 (×2): qty 1

## 2017-03-12 MED ORDER — INSULIN GLARGINE 100 UNIT/ML ~~LOC~~ SOLN
65.0000 [IU] | Freq: Every day | SUBCUTANEOUS | Status: DC
  Administered 2017-03-12: 65 [IU] via SUBCUTANEOUS
  Filled 2017-03-12: qty 0.65

## 2017-03-12 MED ORDER — PROPOFOL 10 MG/ML IV BOLUS
INTRAVENOUS | Status: AC
  Filled 2017-03-12: qty 20

## 2017-03-12 MED ORDER — SODIUM CHLORIDE 0.9% FLUSH
3.0000 mL | Freq: Two times a day (BID) | INTRAVENOUS | Status: DC
  Administered 2017-03-12 – 2017-03-20 (×11): 3 mL via INTRAVENOUS

## 2017-03-12 MED ORDER — SODIUM CHLORIDE 0.9 % IV SOLN
250.0000 mL | INTRAVENOUS | Status: DC | PRN
  Administered 2017-03-19: 250 mL via INTRAVENOUS

## 2017-03-12 MED ORDER — SODIUM CHLORIDE 0.9% FLUSH: 3.0000 mL | INTRAVENOUS | Status: DC | PRN

## 2017-03-12 MED ORDER — MORPHINE SULFATE (PF) 4 MG/ML IV SOLN
4.0000 mg | INTRAVENOUS | Status: DC | PRN
  Administered 2017-03-13 (×2): 4 mg via INTRAVENOUS
  Filled 2017-03-12 (×2): qty 1

## 2017-03-12 MED ORDER — DEXTROSE 50 % IV SOLN
25.0000 mL | Freq: Once | INTRAVENOUS | Status: AC
  Administered 2017-03-12: 25 mL via INTRAVENOUS
  Filled 2017-03-12: qty 50

## 2017-03-12 MED ORDER — MIDAZOLAM HCL 2 MG/2ML IJ SOLN
INTRAMUSCULAR | Status: AC
  Filled 2017-03-12: qty 2

## 2017-03-12 MED ORDER — BUPIVACAINE HCL (PF) 0.5 % IJ SOLN
INTRAMUSCULAR | Status: AC
  Filled 2017-03-12: qty 30

## 2017-03-12 MED ORDER — FENTANYL CITRATE (PF) 250 MCG/5ML IJ SOLN
INTRAMUSCULAR | Status: AC
  Filled 2017-03-12: qty 5

## 2017-03-12 MED ORDER — FENTANYL CITRATE (PF) 100 MCG/2ML IJ SOLN
INTRAMUSCULAR | Status: DC | PRN
  Administered 2017-03-12 (×2): 25 ug via INTRAVENOUS
  Administered 2017-03-12: 50 ug via INTRAVENOUS
  Administered 2017-03-12: 25 ug via INTRAVENOUS

## 2017-03-12 MED ORDER — PROPOFOL 10 MG/ML IV BOLUS
INTRAVENOUS | Status: DC | PRN
  Administered 2017-03-12: 170 mg via INTRAVENOUS

## 2017-03-12 MED ORDER — LIDOCAINE 2% (20 MG/ML) 5 ML SYRINGE
INTRAMUSCULAR | Status: AC
  Filled 2017-03-12: qty 5

## 2017-03-12 MED ORDER — SODIUM CHLORIDE 0.9 % IR SOLN
Status: DC | PRN
  Administered 2017-03-12: 1000 mL

## 2017-03-12 MED ORDER — ACETAMINOPHEN 325 MG PO TABS: 650.0000 mg | ORAL_TABLET | ORAL | Status: DC | PRN

## 2017-03-12 MED ORDER — BUPIVACAINE HCL (PF) 0.5 % IJ SOLN: INTRAMUSCULAR | Status: DC | PRN

## 2017-03-12 MED ORDER — SODIUM POLYSTYRENE SULFONATE 15 GM/60ML PO SUSP
30.0000 g | Freq: Once | ORAL | Status: AC
  Administered 2017-03-12: 30 g via ORAL
  Filled 2017-03-12: qty 120

## 2017-03-12 MED ORDER — PHENYLEPHRINE HCL 10 MG/ML IJ SOLN
INTRAMUSCULAR | Status: DC | PRN
  Administered 2017-03-12: 80 ug via INTRAVENOUS

## 2017-03-12 MED ORDER — OXYCODONE-ACETAMINOPHEN 5-325 MG PO TABS
1.0000 | ORAL_TABLET | ORAL | Status: DC | PRN
Start: 2017-03-12 — End: 2017-03-21
  Administered 2017-03-13 – 2017-03-21 (×13): 2 via ORAL
  Filled 2017-03-12 (×13): qty 2

## 2017-03-12 MED ORDER — INSULIN ASPART 100 UNIT/ML IV SOLN
10.0000 [IU] | INTRAVENOUS | Status: AC
  Administered 2017-03-12: 10 [IU] via INTRAVENOUS

## 2017-03-12 MED ORDER — LIDOCAINE HCL (CARDIAC) 20 MG/ML IV SOLN
INTRAVENOUS | Status: DC | PRN
  Administered 2017-03-12: 80 mg via INTRAVENOUS

## 2017-03-12 MED ORDER — OXYCODONE HCL 5 MG PO TABS: 5.0000 mg | ORAL_TABLET | Freq: Once | ORAL | Status: DC | PRN

## 2017-03-12 MED ORDER — SODIUM CHLORIDE 0.9% FLUSH
10.0000 mL | INTRAVENOUS | Status: DC | PRN
  Administered 2017-03-13 – 2017-03-21 (×7): 10 mL
  Filled 2017-03-12 (×7): qty 40

## 2017-03-12 MED ORDER — FENTANYL CITRATE (PF) 100 MCG/2ML IJ SOLN: 25.0000 ug | INTRAMUSCULAR | Status: DC | PRN

## 2017-03-12 MED ORDER — LACTATED RINGERS IV SOLN
INTRAVENOUS | Status: DC
  Administered 2017-03-12: 16:00:00 via INTRAVENOUS

## 2017-03-12 MED ORDER — OXYCODONE HCL 5 MG/5ML PO SOLN: 5.0000 mg | Freq: Once | ORAL | Status: DC | PRN

## 2017-03-12 MED ORDER — HYDROCODONE-ACETAMINOPHEN 5-325 MG PO TABS
1.0000 | ORAL_TABLET | ORAL | Status: DC | PRN
  Administered 2017-03-12 – 2017-03-16 (×3): 1 via ORAL
  Filled 2017-03-12 (×4): qty 1

## 2017-03-12 MED ORDER — PROMETHAZINE HCL 25 MG PO TABS
12.5000 mg | ORAL_TABLET | ORAL | Status: DC | PRN
  Administered 2017-03-13: 12.5 mg via ORAL
  Filled 2017-03-12 (×2): qty 1

## 2017-03-12 MED ORDER — LIDOCAINE HCL (PF) 2 % IJ SOLN
INTRAMUSCULAR | Status: AC
  Filled 2017-03-12: qty 4

## 2017-03-12 SURGICAL SUPPLY — 52 items
BANDAGE ACE 4X5 VEL STRL LF (GAUZE/BANDAGES/DRESSINGS) ×3 IMPLANT
BLADE SAW SGTL 83.5X18.5 (BLADE) ×3 IMPLANT
BLADE SURG 15 STRL LF DISP TIS (BLADE) ×3 IMPLANT
BLADE SURG 15 STRL SS (BLADE) ×6
BNDG COHESIVE 6X5 TAN STRL LF (GAUZE/BANDAGES/DRESSINGS) IMPLANT
BNDG ESMARK 4X9 LF (GAUZE/BANDAGES/DRESSINGS) ×3 IMPLANT
BNDG GAUZE ELAST 4 BULKY (GAUZE/BANDAGES/DRESSINGS) ×3 IMPLANT
BOWL SMART MIX CTS (DISPOSABLE) IMPLANT
CHLORAPREP W/TINT 26ML (MISCELLANEOUS) ×3 IMPLANT
COVER SURGICAL LIGHT HANDLE (MISCELLANEOUS) ×3 IMPLANT
CUFF TOURNIQUET SINGLE 18IN (TOURNIQUET CUFF) ×3 IMPLANT
CUFF TOURNIQUET SINGLE 34IN LL (TOURNIQUET CUFF) ×3 IMPLANT
DRAPE C-ARM MINI 42X72 WSTRAPS (DRAPES) ×3 IMPLANT
DRAPE U-SHAPE 47X51 STRL (DRAPES) ×3 IMPLANT
DRSG PAD ABDOMINAL 8X10 ST (GAUZE/BANDAGES/DRESSINGS) ×3 IMPLANT
ELECT CAUTERY BLADE 6.4 (BLADE) ×3 IMPLANT
ELECT REM PT RETURN 9FT ADLT (ELECTROSURGICAL)
ELECTRODE REM PT RTRN 9FT ADLT (ELECTROSURGICAL) IMPLANT
GAUZE SPONGE 4X4 12PLY STRL (GAUZE/BANDAGES/DRESSINGS) ×3 IMPLANT
GAUZE SPONGE 4X4 12PLY STRL LF (GAUZE/BANDAGES/DRESSINGS) ×3 IMPLANT
GAUZE XEROFORM 1X8 LF (GAUZE/BANDAGES/DRESSINGS) ×3 IMPLANT
GLOVE BIO SURGEON STRL SZ8 (GLOVE) ×3 IMPLANT
GLOVE BIOGEL PI IND STRL 8 (GLOVE) ×1 IMPLANT
GLOVE BIOGEL PI INDICATOR 8 (GLOVE) ×2
GOWN STRL REUS W/ TWL LRG LVL3 (GOWN DISPOSABLE) ×2 IMPLANT
GOWN STRL REUS W/TWL LRG LVL3 (GOWN DISPOSABLE) ×4
HANDPIECE INTERPULSE COAX TIP (DISPOSABLE)
KIT BASIN OR (CUSTOM PROCEDURE TRAY) ×3 IMPLANT
KIT ROOM TURNOVER OR (KITS) ×3 IMPLANT
MANIFOLD NEPTUNE II (INSTRUMENTS) ×3 IMPLANT
NEEDLE HYPO 25GX1X1/2 BEV (NEEDLE) ×3 IMPLANT
NS IRRIG 1000ML POUR BTL (IV SOLUTION) ×3 IMPLANT
PACK ORTHO EXTREMITY (CUSTOM PROCEDURE TRAY) ×3 IMPLANT
PAD ABD 8X10 STRL (GAUZE/BANDAGES/DRESSINGS) ×3 IMPLANT
PAD ARMBOARD 7.5X6 YLW CONV (MISCELLANEOUS) ×6 IMPLANT
PAD CAST 4YDX4 CTTN HI CHSV (CAST SUPPLIES) ×1 IMPLANT
PADDING CAST COTTON 4X4 STRL (CAST SUPPLIES) ×2
PADDING CAST COTTON 6X4 STRL (CAST SUPPLIES) ×3 IMPLANT
SCRUB BETADINE 4OZ XXX (MISCELLANEOUS) ×3 IMPLANT
SET HNDPC FAN SPRY TIP SCT (DISPOSABLE) IMPLANT
SOL PREP POV-IOD 4OZ 10% (MISCELLANEOUS) ×3 IMPLANT
STAPLER VISISTAT 35W (STAPLE) ×3 IMPLANT
STOCKINETTE IMPERVIOUS 9X36 MD (GAUZE/BANDAGES/DRESSINGS) ×3 IMPLANT
SUT PROLENE 3 0 PS 2 (SUTURE) ×3 IMPLANT
SWAB CULTURE LIQ STUART DBL (MISCELLANEOUS) ×3 IMPLANT
SWAB CULTURE LIQUID MINI MALE (MISCELLANEOUS) ×3 IMPLANT
SYR CONTROL 10ML LL (SYRINGE) ×3 IMPLANT
TOWEL OR 17X24 6PK STRL BLUE (TOWEL DISPOSABLE) ×3 IMPLANT
TOWEL OR 17X26 10 PK STRL BLUE (TOWEL DISPOSABLE) ×3 IMPLANT
TUBE CONNECTING 12'X1/4 (SUCTIONS) ×1
TUBE CONNECTING 12X1/4 (SUCTIONS) ×2 IMPLANT
YANKAUER SUCT BULB TIP NO VENT (SUCTIONS) ×3 IMPLANT

## 2017-03-12 NOTE — Interval H&P Note (Signed)
History and Physical Interval Note:  03/12/2017 4:33 PM  Keith [name]  has presented today for surgery, with the diagnosis of septic ankle  The various methods of treatment have been discussed with the patient and family. After consideration of risks, benefits and other options for treatment, the patient has consented to  Procedure(s): IRRIGATION AND DEBRIDEMENT RIGHT ANKLE (Right) as a surgical intervention .  The patient's history has been reviewed, patient examined, no change in status, stable for surgery.  I have reviewed the patient's chart and labs.  Questions were answered to the patient's satisfaction.     Felecia ShellingBrent M Evans

## 2017-03-12 NOTE — Plan of Care (Signed)
Problem: Safety: Goal: Ability to remain free from injury will improve Outcome: Progressing Pt will be free from falls and injuries during this hospitalization.  Problem: Physical Regulation: Goal: Will remain free from infection Outcome: Progressing Pt's infection will improved prior to discharge.

## 2017-03-12 NOTE — H&P (View-Only) (Signed)
CONSULTATION: Podiatry REASON FOR CONSULT: RT ankle infection  HPI:  46 year old male with a history of uncontrolled diabetes mellitus presents as an inpatient for evaluation of right ankle joint sepsis and cellulitis to the soft tissue structures of the ankle. Patient has been seen multiple times in office and is well known to my practice for treatment of a right ankle ulcer which has demonstrated routine healing up through 02/24/2017. Patient was last seen in my office 03/02/2017 which time there was rapid degenerative changes to the ulceration site. Patient was instructed to immediately follow up with infectious disease Dr. Orvan Falconerampbell, and rapid degeneration of the ulcer with cellulitis and sedated and the patient was subsequently admitted directly [facility] inpatient facility. Patient presents today at bedside for further treatment and evaluation regarding the right ankle cellulitis with septic joint.  Patient Active Problem List   Diagnosis Date Noted  . Hypertension 03/10/2017  . Acute kidney injury (HCC) 03/10/2017  . Acute hyperkalemia 03/10/2017  . Anemia, chronic disease 03/10/2017  . Septic arthritis of ankle or foot (HCC) 01/09/2017  . Type II diabetes mellitus (HCC) 01/09/2017  . HTN (hypertension) 01/09/2017  . Neuropathy 01/09/2017  . BPH (benign prostatic hyperplasia) 01/09/2017  . Anemia 12/26/2016  . Osteomyelitis of ankle, right, acute (HCC) 11/27/2016  . Diabetic ulcer of right foot associated with diabetes mellitus due to underlying condition (HCC) 03/31/2016  . LPRD (laryngopharyngeal reflux disease) 09/19/2014  . Hypercholesterolemia 08/01/2014  . Diabetic polyneuropathy associated with type 1 diabetes mellitus (HCC) 07/24/2014    CBC    Component Value Date/Time   WBC 10.8 (H) 03/11/2017 0440   RBC 3.85 (L) 03/11/2017 0440   HGB 9.4 (L) 03/11/2017 0440   HCT 30.7 (L) 03/11/2017 0440   PLT 369 03/11/2017 0440   MCV 79.7 03/11/2017 0440   MCH 24.4 (L)  03/11/2017 0440   MCHC 30.6 03/11/2017 0440   RDW 19.6 (H) 03/11/2017 0440   LYMPHSABS 0.9 03/10/2017 1227   MONOABS 0.6 03/10/2017 1227   EOSABS 0.2 03/10/2017 1227   BASOSABS 0.0 03/10/2017 1227    Past Medical History:  Diagnosis Date  . Diabetic peripheral neuropathy (HCC)   . Hyperlipidemia   . Hypertension   . Type I diabetes mellitus (HCC)     Physical Exam: General: The patient is alert and oriented x3 in no acute distress.  Dermatology: Ulcer noted to the medial aspect of the right ankle with a heavy amount of purulent drainage and localized erythema around the ankle and foot structures of the foot. Ulcer measures approximately 3.52.5 extending to the ankle and subtalar joint.  Vascular: Neurovascular status intact.   Neurological: Epicritic and protective threshold diminished bilaterally.   Musculoskeletal Exam: Severe pain on palpation noted throughout the soft tissue structures of the right ankle.   MRI Impression:   1. Diffuse osteomyelitis involving all of the bones of the the hindfoot and midfoot, as well as the bases of the second through fifth metatarsals, as described above, with chronic fracture through the talar neck. 2. Tibiotalar and subtalar joint septic arthritis with large, loculated abscesses along the medial and lateral ankle, which communicate with the tibiotalar joint. The larger fluid collection along the medial ankle measures up to 6.6 cm. 3. Small amount of fluid in the peroneal tendon sheath and distal peroneus brevis tendon sheath, concerning for infectious tenosynovitis. Likely infectious tenosynovitis of the flexor hallucis longus as well.  Assessment: 1. Right ankle and subtalar joint sepsis complicated by diabetes  mellitus 2. Cellulitis right lower extremity with multiple abscesses 3. Bone marrow edema osseous structures of the foot and ankle right lower extremity  Plan of Care:  1. Patient was evaluated. MRI reviewed today with  the patient 2. Today we're going to plan incision and drainage of the right ankle and subtalar joint along with multiple abscesses within the soft tissues of the foot. The patient understands this is a salvage procedure. Complications and details were explained. No guarantees were expressed or implied. 3. Preoperative orders placed today. Patient will be nothing by mouth after midnight. Surgery tentatively planned for tomorrow afternoon 03/12/2017   Felecia Shelling, DPM [facility]  Dr. Felecia Shelling, DPM    [address] [place], [place] [address]                Office [phone]  Fax [phone]

## 2017-03-12 NOTE — Progress Notes (Addendum)
Patient ID: Keith [name], male   DOB: 12/04/1970, 46 y.o.   MRN: [id number]          [facility]  Date of Admission:  03/10/2017           Day 3 cefazolin ASSESSMENT: He has severe right ankle infection was septic arthritis, osteomyelitis and a large medial abscess. He is scheduled for surgery later this afternoon.  PLAN: 1. Continue cefazolin 2. Surgery today 3. PICC placement  Principal Problem:   Osteomyelitis of ankle, right, acute (HCC) Active Problems:   Type II diabetes mellitus (HCC)   HTN (hypertension)   Hypertension   Acute kidney injury (HCC)   Acute hyperkalemia   Anemia, chronic disease   . aspirin EC  81 mg Oral Daily  . atorvastatin  40 mg Oral q1800  . enoxaparin (LOVENOX) injection  40 mg Subcutaneous Daily  . ferrous sulfate  325 mg Oral TID WC  . gabapentin  1,800 mg Oral TID  . insulin aspart  0-15 Units Subcutaneous TID WC  . insulin aspart  0-5 Units Subcutaneous QHS  . insulin glargine  70 Units Subcutaneous QHS  . PARoxetine  20 mg Oral Daily  . tamsulosin  0.4 mg Oral Daily    SUBJECTIVE:  He says that he is "scared to death" that he will lose his foot He notes that it is much more swollen medially.  Review of Systems: Review of Systems  Constitutional: Positive for fever, malaise/fatigue and weight loss. Negative for chills and diaphoresis.  Gastrointestinal: Negative for abdominal pain, diarrhea, nausea and vomiting.  Musculoskeletal: Positive for joint pain.  Skin: Negative for rash.    Allergies  Allergen Reactions  . Daptomycin Anaphylaxis and Shortness Of Breath    Breathing issues  . Lisinopril Other (See Comments)    Cough    OBJECTIVE: Vitals:   03/11/17 0612 03/11/17 1650 03/11/17 2143 03/12/17 0508  BP:  125/69 139/63 132/68  Pulse:  96 96 97  Resp:  18 17 17   Temp:  98.1 F (36.7 C) 98.3 F (36.8 C) 99 F (37.2 C)  TempSrc:  Oral Oral Oral  SpO2:  93% 97% 97%  Weight: 232 lb 6.4 oz  (105.4 kg)     Height: 6' (1.829 m)      Body mass index is 31.52 kg/m.  Physical Exam  Constitutional: He is oriented to person, place, and time.  He is appropriately concerned.  Cardiovascular: Regular rhythm.   No murmur heard. He is still mildly tachycardic.  Pulmonary/Chest: Effort normal and breath sounds normal.  Abdominal: Soft. There is no tenderness.  Musculoskeletal:  There is much more swelling medially above the open wound today.  Neurological: He is alert and oriented to person, place, and time.  Skin: No rash noted.  Psychiatric: Mood and affect normal.    Lab Results Lab Results  Component Value Date   WBC 10.7 (H) 03/12/2017   HGB 9.3 (L) 03/12/2017   HCT 30.8 (L) 03/12/2017   MCV 80.2 03/12/2017   PLT 360 03/12/2017    Lab Results  Component Value Date   CREATININE 1.18 03/12/2017   BUN 27 (H) 03/12/2017   NA 132 (L) 03/12/2017   K 6.0 (H) 03/12/2017   CL 104 03/12/2017   CO2 22 03/12/2017    Lab Results  Component Value Date   ALT 69 (H) 03/10/2017   AST 72 (H) 03/10/2017   ALKPHOS 104 03/10/2017   BILITOT 0.7 03/10/2017  Sed Rate (mm/hr)  Date Value  03/10/2017 123 (H)  01/09/2017 71 (H)  12/12/2016 103 (H)   CRP (mg/L)  Date Value  01/09/2017 27.3 (H)   Microbiology: Recent Results (from the past 240 hour(s))  WOUND CULTURE     Status: None   Collection Time: 03/02/17  3:00 PM  Result Value Ref Range Status   Culture Abundant STAPHYLOCOCCUS AUREUS  Final   Gram Stain No WBC Seen  Final   Gram Stain No Squamous Epithelial Cells Seen  Final   Gram Stain No Organisms Seen  Final   Organism ID, Bacteria STAPHYLOCOCCUS AUREUS  Final    Comment: Rifampin and Gentamicin should not be used as single drugs for treatment of Staph infections.       Susceptibility   Staphylococcus aureus -  (no method available)    OXACILLIN 0.5 Sensitive     CEFAZOLIN  Sensitive     GENTAMICIN <=0.5 Sensitive     CIPROFLOXACIN <=0.5 Sensitive      LEVOFLOXACIN 0.25 Sensitive     MOXIFLOXACIN <=0.25 Sensitive     TRIMETH/SULFA <=10 Sensitive     VANCOMYCIN <=0.5 Sensitive     CLINDAMYCIN <=0.25 Sensitive     ERYTHROMYCIN <=0.25 Sensitive     LINEZOLID 2 Sensitive     QUINUPRISTIN/DALF <=0.25 Sensitive     RIFAMPIN <=0.5 Sensitive     TETRACYCLINE <=1 Sensitive   Culture, blood (Routine X 2) w Reflex to ID Panel     Status: None (Preliminary result)   Collection Time: 03/10/17 12:23 PM  Result Value Ref Range Status   Specimen Description BLOOD RIGHT ANTECUBITAL  Final   Special Requests IN PEDIATRIC BOTTLE Blood Culture adequate volume  Final   Culture NO GROWTH < 24 HOURS  Final   Report Status PENDING  Incomplete  Culture, blood (Routine X 2) w Reflex to ID Panel     Status: None (Preliminary result)   Collection Time: 03/10/17 12:31 PM  Result Value Ref Range Status   Specimen Description BLOOD RIGHT ANTECUBITAL  Final   Special Requests IN PEDIATRIC BOTTLE Blood Culture adequate volume  Final   Culture NO GROWTH < 24 HOURS  Final   Report Status PENDING  Incomplete  Surgical pcr screen     Status: None   Collection Time: 03/11/17 10:02 PM  Result Value Ref Range Status   MRSA, PCR NEGATIVE NEGATIVE Final   Staphylococcus aureus NEGATIVE NEGATIVE Final    Comment: (NOTE) The Xpert SA Assay (FDA approved for NASAL specimens in patients 62 years of age and older), is one component of a comprehensive surveillance program. It is not intended to diagnose infection nor to guide or monitor treatment.    MRI right ankle 03/11/2017  IMPRESSION 1. Diffuse osteomyelitis involving all of the bones of the the hindfoot and midfoot, as well as the bases of the second through fifth metatarsals, as described above, with chronic fracture through the talar neck. 2. Tibiotalar and subtalar joint septic arthritis with large, loculated abscesses along the medial and lateral ankle, which communicate with the tibiotalar joint. The  larger fluid collection along the medial ankle measures up to 6.6 cm. 3. Small amount of fluid in the peroneal tendon sheath and distal peroneus brevis tendon sheath, concerning for infectious tenosynovitis. Likely infectious tenosynovitis of the flexor hallucis longus as well.   Electronically Signed   By: Obie Dredge M.D.   On: 03/11/2017 16:49  Cliffton Asters, MD [facility]  [facility] [phone] pager   [phone] cell 03/12/2017, 10:45 AM

## 2017-03-12 NOTE — Progress Notes (Signed)
[facility]                                                                              Patient Demographics  Keith Reddick, is a 46 y.o. male, DOB - 07-08-1971, ZOX:[id number]  Admit date - 03/10/2017   Admitting Physician Ozella Rocks, MD  Outpatient Primary MD for the patient is [facility]  Outpatient specialists:   LOS - 2  days   Medical records reviewed and are as summarized below:    No chief complaint on file.      Brief summary   Keith [name] is a 46 y.o. male with medical history significant for diabetes mellitus 2 on insulin, obesity, BPH on Flomax, hypertension, dyslipidemia, peripheral neuropathy, and septic arthritis of right ankle. The patient was hospitalized at [facility] May 2018 for septic right ankle. At that time there was no open wound. Podiatrist recommended to discontinue antibiotics given during the hospitalization. He had previously been treated with Bactrim for 2 weeks. Unfortunately the ankle became worse and he developed an open wound. He also underwent intraoperative I&D on 6/14 with cultures being positive for MSSA and he was started on amoxicillin. He was evaluated on 6/29 by ID and changed to IV cefazolin via PICC line with this drug actually initiated on 7/3. He completed 6 weeks of antibiotics on 8/14. Shortly after completing these antibiotics he noticed increased swelling and drainage from the ankle and increasing severe shooting type pains. He followed up with his podiatrist on 8/20 and wound cultures again were positive for MSSA. He was started on Bactrim. Oral ciprofloxacin was added on 8/22. Today he followed up with the ID clinic/Dr. Orvan Falconer complaining of extreme weakness, no appetite, and weight loss of 20 pounds. Clinically he appeared pale and was tachycardic with SBP in the 90s. He was sent directly from the office to the hospital for admission. Of note the ankle wound which had nearly healed from the previous surgery and  antibiotic treatment has now opened and is at least 3 x 4 cm.   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, per patient became significantly worse after finishing IV antibiotics - ID following, recommended to continue IV Ancef, PICC   - MRI of the right foot showed diffuse osteomyelitis involving all the bones of the hindfoot and midfoot, basis of second through fifth metatarsals, TB or talar and subtalar joint septic arthritis with large loculated abscess along the medial and lateral ankle which communicate with the tibiotalar joint, 6.6 cm - podiatry, Dr. Logan Bores consulted, planned for OR today   Active Problems:   Type II diabetes mellitus (HCC)  - decrease Lantus to 65 units, continue sliding scale insulin    HTN (hypertension) - BP soft, continue to hold antihypertensives    Acute kidney injury (HCC)acute hyperkalemia, metabolic acidosis - Secondary to dehydration, hypoperfusion, NSAIDs and ARB - Continue to hold offending medications, aggressive IV fluid hydration - potassium 6.2, gave calcium gluconate 1, Insulin + dextrose protocol     Acute hyperkalemia -  possibly due to NSAIDS, ARB -potassium again 6.0 this morning,ordered calcium gluconate 1. Insulin +dextrose protocol, Kayexalate 30 g 1    Anemia, chronic disease - currently stable, continue close monitoring, baseline hemoglobin 8.3  Hyperlipidemia - Continue Lipitor  BPH, no urinary obstruction - BP stable, restart Flomax  Code Status:  full DVT Prophylaxis:  Lovenox  Family Communication: Discussed in detail with the patient, all imaging results, lab results explained to the patient    Disposition Plan:   Time Spent in minutes   25 minutes  Procedures:  MRI of the right foot  Consultants:   Infectious disease Podiatry  Antimicrobials:   IV Ancef, 8/28 >   Medications  Scheduled Meds: . aspirin EC   81 mg Oral Daily  . atorvastatin  40 mg Oral q1800  . enoxaparin (LOVENOX) injection  40 mg Subcutaneous Daily  . ferrous sulfate  325 mg Oral TID WC  . gabapentin  1,800 mg Oral TID  . insulin aspart  0-15 Units Subcutaneous TID WC  . insulin aspart  0-5 Units Subcutaneous QHS  . insulin glargine  70 Units Subcutaneous QHS  . PARoxetine  20 mg Oral Daily  . tamsulosin  0.4 mg Oral Daily   Continuous Infusions: . sodium chloride 150 mL/hr at 03/12/17 0657  .  ceFAZolin (ANCEF) IV Stopped (03/12/17 0727)   PRN Meds:.acetaminophen, morphine injection, ondansetron **OR** ondansetron (ZOFRAN) IV, oxyCODONE   Antibiotics   Anti-infectives    Start     Dose/Rate Route Frequency Ordered Stop   03/10/17 1230  ceFAZolin (ANCEF) IVPB 2g/100 mL premix     2 g 200 mL/hr over 30 Minutes Intravenous Every 8 hours 03/10/17 1158          Subjective:   Keith Fero was seen and examined today.somewhat nervous about the surgery, pain in the right foot controlled with the pain medications. Low-grade fever of 69F this morning.  Patient denies dizziness, chest pain, shortness of breath, abdominal pain, N/V/D/C, new weakness, numbess, tingling.   Objective:   Vitals:   03/11/17 0612 03/11/17 1650 03/11/17 2143 03/12/17 0508  BP:  125/69 139/63 132/68  Pulse:  96 96 97  Resp:  18 17 17   Temp:  98.1 F (36.7 C) 98.3 F (36.8 C) 99 F (37.2 C)  TempSrc:  Oral Oral Oral  SpO2:  93% 97% 97%  Weight: 105.4 kg (232 lb 6.4 oz)     Height: 6' (1.829 m)       Intake/Output Summary (Last 24 hours) at 03/12/17 1218 Last data filed at 03/11/17 2252  Gross per 24 hour  Intake           1312.5 ml  Output             2900 ml  Net          -1587.5 ml     Wt Readings from Last 3 Encounters:  03/11/17 105.4 kg (232 lb 6.4 oz)  03/10/17 102.5 kg (226 lb)  02/19/17 109.8 kg (242 lb)     Exam   General: Alert and oriented x 3, NAD  Eyes:   HEENT:  Atraumatic,  normocephalic  Cardiovascular: S1 S2 auscultated, RRR No pedal edema b/l  Respiratory: Clear to auscultation bilaterally, no wheezing, rales or rhonchi  Gastrointestinal: Soft, nontender, nondistended, + bowel sounds  Ext: no pedal edema bilaterally  Neuro: no new deficits  Musculoskeletal: No digital cyanosis, clubbing  Skin: right foot dressing intact  Psych: Normal affect and  demeanor, alert and oriented x3    Data Reviewed:  I have personally reviewed following labs and imaging studies  Micro Results Recent Results (from the past 240 hour(s))  WOUND CULTURE     Status: None   Collection Time: 03/02/17  3:00 PM  Result Value Ref Range Status   Culture Abundant STAPHYLOCOCCUS AUREUS  Final   Gram Stain No WBC Seen  Final   Gram Stain No Squamous Epithelial Cells Seen  Final   Gram Stain No Organisms Seen  Final   Organism ID, Bacteria STAPHYLOCOCCUS AUREUS  Final    Comment: Rifampin and Gentamicin should not be used as single drugs for treatment of Staph infections.       Susceptibility   Staphylococcus aureus -  (no method available)    OXACILLIN 0.5 Sensitive     CEFAZOLIN  Sensitive     GENTAMICIN <=0.5 Sensitive     CIPROFLOXACIN <=0.5 Sensitive     LEVOFLOXACIN 0.25 Sensitive     MOXIFLOXACIN <=0.25 Sensitive     TRIMETH/SULFA <=10 Sensitive     VANCOMYCIN <=0.5 Sensitive     CLINDAMYCIN <=0.25 Sensitive     ERYTHROMYCIN <=0.25 Sensitive     LINEZOLID 2 Sensitive     QUINUPRISTIN/DALF <=0.25 Sensitive     RIFAMPIN <=0.5 Sensitive     TETRACYCLINE <=1 Sensitive   Culture, blood (Routine X 2) w Reflex to ID Panel     Status: None (Preliminary result)   Collection Time: 03/10/17 12:23 PM  Result Value Ref Range Status   Specimen Description BLOOD RIGHT ANTECUBITAL  Final   Special Requests IN PEDIATRIC BOTTLE Blood Culture adequate volume  Final   Culture NO GROWTH 2 DAYS  Final   Report Status PENDING  Incomplete  Culture, blood (Routine X 2) w Reflex to  ID Panel     Status: None (Preliminary result)   Collection Time: 03/10/17 12:31 PM  Result Value Ref Range Status   Specimen Description BLOOD RIGHT ANTECUBITAL  Final   Special Requests IN PEDIATRIC BOTTLE Blood Culture adequate volume  Final   Culture NO GROWTH 2 DAYS  Final   Report Status PENDING  Incomplete  Surgical pcr screen     Status: None   Collection Time: 03/11/17 10:02 PM  Result Value Ref Range Status   MRSA, PCR NEGATIVE NEGATIVE Final   Staphylococcus aureus NEGATIVE NEGATIVE Final    Comment: (NOTE) The Xpert SA Assay (FDA approved for NASAL specimens in patients 44 years of age and older), is one component of a comprehensive surveillance program. It is not intended to diagnose infection nor to guide or monitor treatment.     Radiology Reports Mr Ankle Right W Wo Contrast  Result Date: 03/11/2017 CLINICAL DATA:  Right ankle osteomyelitis. EXAM: MRI OF THE RIGHT ANKLE WITHOUT AND WITH CONTRAST TECHNIQUE: Multiplanar, multisequence MR imaging of the ankle was performed before and after the administration of intravenous contrast. CONTRAST:  20mL MULTIHANCE GADOBENATE DIMEGLUMINE 529 MG/ML IV SOLN COMPARISON:  Right ankle x-rays dated December 12, 2016. Right foot MRI dated Nov 27, 2016. FINDINGS: TENDONS Peroneal: Mildly thickened. Small amount of fluid in the tendon sheath at the retromalleolar groove. Additional small amount of fluid in the peroneus brevis tendon sheath near its insertion on the base of fifth metatarsal. Posteromedial: Grossly intact. Anterior: Intact. Achilles: Intact. Plantar Fascia: Mild thickening of the plantar fascia. LIGAMENTS Lateral: The anterior talofibular and calcaneofibular ligaments are attenuated and likely chronically torn. Medial: Grossly intact. CARTILAGE Ankle  Joint: There is a large ankle joint effusion with enhancing synovium. Subtalar Joints/Sinus Tarsi: Moderate subtalar joint effusion with enhancing synovium. Abnormal signal within the  sinus tarsi. Bones: There is prominent, enhancing marrow edema with corresponding T1 hypointensity and cortical destruction involving the distal tibia, distal fibula, talus, calcaneus, cuboid, navicular, all 3 cuneiforms, and the bases of the second through fifth metatarsals. There is a chronic fracture through the talar neck. Other: There is an open soft tissue defect along the medial ankle. There is a large multiloculated, rim enhancing fluid collection along the medial ankle, measuring approximately 6.2 x 2.4 x 6.6 cm (AP by transverse by CC) . This fluid collection likely communicates with the ankle joint and courses around the flexor hallucis longus muscle and tendon. There is an additional multiloculated 4.8 x 1.6 x 3.7 cm (AP by transverse by CC) rim enhancing fluid collection along the lateral ankle which also communicates with the tibiotalar joint. IMPRESSION: IMPRESSION 1. Diffuse osteomyelitis involving all of the bones of the the hindfoot and midfoot, as well as the bases of the second through fifth metatarsals, as described above, with chronic fracture through the talar neck. 2. Tibiotalar and subtalar joint septic arthritis with large, loculated abscesses along the medial and lateral ankle, which communicate with the tibiotalar joint. The larger fluid collection along the medial ankle measures up to 6.6 cm. 3. Small amount of fluid in the peroneal tendon sheath and distal peroneus brevis tendon sheath, concerning for infectious tenosynovitis. Likely infectious tenosynovitis of the flexor hallucis longus as well. Electronically Signed   By: Obie DredgeWilliam T Derry M.D.   On: 03/11/2017 16:49    Lab Data:  CBC:  Recent Labs Lab 03/10/17 1227 03/11/17 0440 03/12/17 0437  WBC 13.8* 10.8* 10.7*  NEUTROABS 12.1*  --   --   HGB 10.2* 9.4* 9.3*  HCT 33.0* 30.7* 30.8*  MCV 79.3 79.7 80.2  PLT 355 369 360   Basic Metabolic Panel:  Recent Labs Lab 03/10/17 1227 03/10/17 1747 03/11/17 0440  03/11/17 1407 03/12/17 0437  NA 132* 133* 135  --  132*  K 6.6* 5.4* 6.2* 6.1* 6.0*  CL 103 104 104  --  104  CO2 20* 20* 23  --  22  GLUCOSE 92 153* 105*  --  166*  BUN 56* 50* 37*  --  27*  CREATININE 2.48* 1.94* 1.60*  --  1.18  CALCIUM 10.6* 10.2 10.2  --  10.0  MG 2.1  --   --   --   --    GFR: Estimated Creatinine Clearance: 98.1 mL/min (by C-G formula based on SCr of 1.18 mg/dL). Liver Function Tests:  Recent Labs Lab 03/10/17 1227  AST 72*  ALT 69*  ALKPHOS 104  BILITOT 0.7  PROT 8.2*  ALBUMIN 2.7*   No results for input(s): LIPASE, AMYLASE in the last 168 hours. No results for input(s): AMMONIA in the last 168 hours. Coagulation Profile: No results for input(s): INR, PROTIME in the last 168 hours. Cardiac Enzymes: No results for input(s): CKTOTAL, CKMB, CKMBINDEX, TROPONINI in the last 168 hours. BNP (last 3 results) No results for input(s): PROBNP in the last 8760 hours. HbA1C:  Recent Labs  03/10/17 1227  HGBA1C 6.2*   CBG:  Recent Labs Lab 03/11/17 1221 03/11/17 1648 03/11/17 2142 03/12/17 0749 03/12/17 0929  GLUCAP 107* 135* 183* 146* 73   Lipid Profile: No results for input(s): CHOL, HDL, LDLCALC, TRIG, CHOLHDL, LDLDIRECT in the last 72 hours. Thyroid Function Tests:  No results for input(s): TSH, T4TOTAL, FREET4, T3FREE, THYROIDAB in the last 72 hours. Anemia Panel: No results for input(s): VITAMINB12, FOLATE, FERRITIN, TIBC, IRON, RETICCTPCT in the last 72 hours. Urine analysis:    Component Value Date/Time   COLORURINE GREEN (A) 11/06/2016 1758   APPEARANCEUR CLOUDY (A) 11/06/2016 1758   LABSPEC 1.018 11/06/2016 1758   PHURINE 5.0 11/06/2016 1758   GLUCOSEU NEGATIVE 11/06/2016 1758   HGBUR SMALL (A) 11/06/2016 1758   BILIRUBINUR NEGATIVE 11/06/2016 1758   KETONESUR NEGATIVE 11/06/2016 1758   PROTEINUR 100 (A) 11/06/2016 1758   NITRITE NEGATIVE 11/06/2016 1758   LEUKOCYTESUR LARGE (A) 11/06/2016 1758     Harlean Regula  M.D. [facility] 03/12/2017, 12:18 PM  Pager: [phone] Between 7am to 7pm - call Pager - [phone]  After 7pm go to [email or web address] - password [email or web address]  Call night coverage person covering after 7pm

## 2017-03-12 NOTE — Progress Notes (Signed)
Pt arrived back onto unit from surgery. Pt has some drainage coming from operative foot. MD aware, Post op nurse reinforced dressing prior to pt arriving onto the unit.

## 2017-03-12 NOTE — Brief Op Note (Signed)
03/10/2017 - 03/12/2017  6:34 PM  PATIENT:  Keith [name]  46 y.o. male  PRE-OPERATIVE DIAGNOSIS:  septic ankle  POST-OPERATIVE DIAGNOSIS:  septic ankle  PROCEDURE:  Procedure(s): IRRIGATION AND DEBRIDEMENT RIGHT ANKLE (Right)  SURGEON:  Surgeon(s) and Role:    Felecia Shelling* Innocence Schlotzhauer M, DPM - Primary  PHYSICIAN ASSISTANT:   ASSISTANTS: none   ANESTHESIA:   general  EBL:  Total I/O In: 500 [I.V.:500] Out: 50 [Blood:50]  BLOOD ADMINISTERED:none  DRAINS: none   LOCAL MEDICATIONS USED:  NONE  SPECIMEN:  Source of Specimen:  Abscess right ankle  DISPOSITION OF SPECIMEN:  PATHOLOGY  COUNTS:  YES  TOURNIQUET:   Total Tourniquet Time Documented: Calf (Right) - 34 minutes Total: Calf (Right) - 34 minutes   DICTATION: .Reubin Milanragon Dictation  PLAN OF CARE: Admit to inpatient   PATIENT DISPOSITION:  PACU - hemodynamically stable.   Delay start of Pharmacological VTE agent (>24hrs) due to surgical blood loss or risk of bleeding: not applicable  Felecia ShellingBrent M. Deneene Tarver, DPM [facility]  Dr. Felecia ShellingBrent M. Khalise Billard, DPM    [address]                                    [place], [place] [address]                Office [phone]  Fax [phone]

## 2017-03-12 NOTE — Op Note (Addendum)
OPERATIVE REPORT Patient name: Keith [name] MRN: [id number] DOB: 01/12/1971  DOS:  03/12/2017  Preop Dx: Septic arthritis/multiple abscesses right foot and ankle Postop Dx: same  Procedure:  1. Irrigation and debridment / incision and drainage right foot and ankle abscesses  Surgeon: Felecia ShellingBrent M. Evans DPM  Anesthesia: General anesthesia  Hemostasis: Calf tourniquet inflated to a pressure of 250mmHg without exsanguination   EBL: 50 mL Materials: None Injectables: None  Pathology: Deep wound culture ankle abscess right sent to pathology for aerobic, anaerobic, and gram stain  Condition: The patient tolerated the procedure and anesthesia well. No complications noted or reported   Justification for procedure: The patient is a 46 y.o.  is a history of uncontrolled diabetes mellitus who presents today for surgical correctacute cellulitis and septic arthritis of the right ankle and subtalar joints with multiple abscesses as per MRI. All conservative modalities of been unsuccessful in providing any sort of satisfactory alleviation of symptoms with the patient. The patient was told benefits as well as possible side effects of the surgery. The patient consented for surgical correction. The patient consent form was reviewed. All patient questions were answered. No guarantees were expressed or implied. The patient and the surgeon boson the patient consent form with the witness present and placed in the patient's chart.   Procedure in Detail: The patient was brought to the operating room, placed in the operating table in the supine position at which time an aseptic scrub and drape were performed about the patient's respective lower extremity after anesthesia was induced as described above. Attention was then directed to the surgical area where procedure number one commenced. prior to commencement of the procedure tourniquet was utilized around the calf of the right lower extremity without Esmarch  bandage exsanguination.   Procedure #1:  irrigation and debridement right ankle and subtalar joints as well as incision and drainage of multiple abscesses right foot and ankle   A 10 cm curvilinear longitudinal skin incision was planned and made about the medial aspect of the right ankle directly overlying the soft tissue abscess. Upon incision of the superficial skin immediate heavy purulent drainage was expressed from the incision site. After superficial skin incision a curved mosquito was utilized for blunt dissection down to the level ankle joint. Care was taken to avoid all major neurovascular structures. Excessive amounts of abscesses were explored and perforated with heavy amount of purulent drainage. All compartments of the dorsum of the foot, posterior ankle, and plantar foot were incised and heavy drainage was noted. The abscesses throughout the mid tarsal joints of the foot extended to the lateral portion of the lower extremity.  Another 10 cm curvilinear longitudinal skin incision was planned and made about the lateral aspect of the patient's right ankle joint following the peroneal tendons. Again immediate purulent drainage was expressed and drained upon incision. Care was taken to visualize and retract and preserve all major neurovascular structures traversing the incision site.  After the incisions were made medial and lateral aspects of the patient's ankle and all compartments of the ankle were explored and drainage was expressed, copious irrigation using pulse lavage and 3 L of normal saline were utilized to the medial and lateral incision sites for copious irrigation. Devitalized necrotic tissue was sharply dissected away.  After copious irrigation, wet-to-dry dressings were then applied to all previously mentioned incision sites about the patient's lower extremity. The tourniquet which was used for hemostasis was deflated. All normal neurovascular responses including pink color and  warmth returned all the digits of patient's lower extremity.  The patient was then transferred from the operating room to the recovery room having tolerated the procedure and anesthesia well. All vital signs are stable. After a brief stay in the recovery room the patient was readmitted to inpatient room with prescriptions for adequate prescriptions for analgesia.   Patient is currently being managed by infectious disease to manage the infectious portion of the pathology.   IMPRESSION: Patient is high risk for below-knee amputation. An extensive amount of necrotic and osteolytic bone was visualized within the incision sites and intraoperatively. Patient may require serial debridement with incision and drainage. He may ultimately require below-knee amputation due to the severity of the infection and septic nature of the pedal and ankle joints of the right lower extremity. Recommend the patient will require long-term IV antibiotics as per infectious disease unless the patient considers below-knee amputation.    Felecia Shelling, DPM [facility]  Dr. Felecia Shelling, DPM    [address]                                        [place], [place] [address]                Office [phone]  Fax [phone]

## 2017-03-12 NOTE — Anesthesia Preprocedure Evaluation (Signed)
Anesthesia Evaluation  Patient identified by MRN, date of birth, ID band Patient awake    Reviewed: Allergy & Precautions, H&P , NPO status , Patient's Chart, lab work & pertinent test results, reviewed documented beta blocker date and time   History of Anesthesia Complications Negative for: history of anesthetic complications  Airway Mallampati: II  TM Distance: >3 FB Neck ROM: full    Dental no notable dental hx. (+) Poor Dentition, Chipped, Missing   Pulmonary neg pulmonary ROS,    breath sounds clear to auscultation       Cardiovascular Exercise Tolerance: Good hypertension, (-) angina(-) CAD, (-) Past MI, (-) Cardiac Stents and (-) CABG (-) dysrhythmias (-) Valvular Problems/Murmurs Rhythm:Regular     Neuro/Psych neuropathy  Neuromuscular disease negative psych ROS   GI/Hepatic negative GI ROS, Neg liver ROS,   Endo/Other  diabetes, Type 2, Insulin Dependent  Renal/GU ARFRenal diseaseHyperkalemia, resolving AKI  negative genitourinary   Musculoskeletal  (+) Arthritis , Right ankle infx   Abdominal   Peds  Hematology   Anesthesia Other Findings   Reproductive/Obstetrics negative OB ROS                             Anesthesia Physical Anesthesia Plan  ASA: III  Anesthesia Plan: General   Post-op Pain Management:    Induction: Intravenous  PONV Risk Score and Plan: 2 and Ondansetron  Airway Management Planned: LMA  Additional Equipment: None  Intra-op Plan:   Post-operative Plan: Extubation in OR  Informed Consent: I have reviewed the patients History and Physical, chart, labs and discussed the procedure including the risks, benefits and alternatives for the proposed anesthesia with the patient or authorized representative who has indicated his/her understanding and acceptance.   Dental advisory given  Plan Discussed with: CRNA and Surgeon  Anesthesia Plan Comments:          Anesthesia Quick Evaluation

## 2017-03-12 NOTE — Progress Notes (Signed)
Peripherally Inserted Central Catheter/Midline Placement  The IV Nurse has discussed with the patient and/or persons authorized to consent for the patient, the purpose of this procedure and the potential benefits and risks involved with this procedure.  The benefits include less needle sticks, lab draws from the catheter, and the patient may be discharged home with the catheter. Risks include, but not limited to, infection, bleeding, blood clot (thrombus formation), and puncture of an artery; nerve damage and irregular heartbeat and possibility to perform a PICC exchange if needed/ordered by physician.  Alternatives to this procedure were also discussed.  Bard Power PICC patient education guide, fact sheet on infection prevention and patient information card has been provided to patient /or left at bedside.    PICC/Midline Placement Documentation         Moe Brier, Lajean ManesKerry Loraine 03/12/2017, 10:17 PM

## 2017-03-13 LAB — CBC
HCT: 29.3 % — ABNORMAL LOW (ref 39.0–52.0)
Hemoglobin: 8.8 g/dL — ABNORMAL LOW (ref 13.0–17.0)
MCH: 23.8 pg — ABNORMAL LOW (ref 26.0–34.0)
MCHC: 30 g/dL (ref 30.0–36.0)
MCV: 79.4 fL (ref 78.0–100.0)
PLATELETS: 358 10*3/uL (ref 150–400)
RBC: 3.69 MIL/uL — ABNORMAL LOW (ref 4.22–5.81)
RDW: 17.8 % — AB (ref 11.5–15.5)
WBC: 10.1 10*3/uL (ref 4.0–10.5)

## 2017-03-13 LAB — GLUCOSE, CAPILLARY
Glucose-Capillary: 275 mg/dL — ABNORMAL HIGH (ref 65–99)
Glucose-Capillary: 228 mg/dL — ABNORMAL HIGH (ref 65–99)
Glucose-Capillary: 218 mg/dL — ABNORMAL HIGH (ref 65–99)
Glucose-Capillary: 193 mg/dL — ABNORMAL HIGH (ref 65–99)

## 2017-03-13 LAB — BASIC METABOLIC PANEL
Anion gap: 8 (ref 5–15)
BUN: 27 mg/dL — AB (ref 6–20)
CALCIUM: 9.3 mg/dL (ref 8.9–10.3)
CHLORIDE: 100 mmol/L — AB (ref 101–111)
CO2: 23 mmol/L (ref 22–32)
Creatinine, Ser: 0.98 mg/dL (ref 0.61–1.24)
GFR calc non Af Amer: >60 mL/min (ref >60)
GLUCOSE: 344 mg/dL — AB (ref 65–99)
Potassium: 5 mmol/L (ref 3.5–5.1)
Sodium: 131 mmol/L — ABNORMAL LOW (ref 135–145)

## 2017-03-13 MED ORDER — INSULIN GLARGINE 100 UNIT/ML ~~LOC~~ SOLN
75.0000 [IU] | Freq: Every day | SUBCUTANEOUS | Status: DC
  Administered 2017-03-13 – 2017-03-14 (×2): 75 [IU] via SUBCUTANEOUS
  Filled 2017-03-13 (×3): qty 0.75

## 2017-03-13 MED ORDER — INSULIN GLARGINE 100 UNIT/ML ~~LOC~~ SOLN
10.0000 [IU] | Freq: Once | SUBCUTANEOUS | Status: AC
  Administered 2017-03-13: 10 [IU] via SUBCUTANEOUS
  Filled 2017-03-13: qty 0.1

## 2017-03-13 MED ORDER — INSULIN ASPART 100 UNIT/ML ~~LOC~~ SOLN
4.0000 [IU] | Freq: Three times a day (TID) | SUBCUTANEOUS | Status: DC
  Administered 2017-03-13 – 2017-03-15 (×6): 4 [IU] via SUBCUTANEOUS

## 2017-03-13 NOTE — Transfer of Care (Signed)
Immediate Anesthesia Transfer of Care Note  Patient: Keith [name]  Procedure(s) Performed: Procedure(s): IRRIGATION AND DEBRIDEMENT RIGHT ANKLE (Right)  Patient Location: PACU  Anesthesia Type:General  Level of Consciousness: patient cooperative and responds to stimulation  Airway & Oxygen Therapy: Patient Spontanous Breathing  Post-op Assessment: Report given to RN and Post -op Vital signs reviewed and stable  Post vital signs: Reviewed and stable  Last Vitals:  Vitals:   03/12/17 1949 03/13/17 0503  BP: 121/63 125/71  Pulse: 95 96  Resp: 18 18  Temp: (!) 36.3 C 36.6 C  SpO2: 97%     Last Pain:  Vitals:   03/13/17 0503  TempSrc: Oral  PainSc:       Patients Stated Pain Goal: 0 (03/13/17 0111)  Complications: No apparent anesthesia complications

## 2017-03-13 NOTE — Plan of Care (Signed)
Problem: Safety: Goal: Ability to remain free from injury will improve Outcome: Progressing Pt will remain free from falls and injuries during this hospitalization.  Problem: Activity: Goal: Risk for activity intolerance will decrease Outcome: Progressing Pt's tolerance for activity will increase prior to discharge.  Problem: Pain Management: Goal: General experience of comfort will improve Outcome: Progressing Pt's pain will be at goal tolerable level prior to discharge.  Problem: Skin Integrity: Goal: Demonstration of wound healing without infection will improve Outcome: Progressing Pt's wound infection will began to improve prior to discharge.

## 2017-03-13 NOTE — Progress Notes (Signed)
[facility]                                                                              Patient Demographics  Keith [name], is a 46 y.o. male, DOB - 14-Apr-1971, ZOX:[id number]  Admit date - 03/10/2017   Admitting Physician Ozella Rocks, MD  Outpatient Primary MD for the patient is [facility]  Outpatient specialists:   LOS - 3  days   Medical records reviewed and are as summarized below:    No chief complaint on file.      Brief summary   Keith E Shambaugh is a 46 y.o. male with medical history significant for diabetes mellitus 2 on insulin, obesity, BPH on Flomax, hypertension, dyslipidemia, peripheral neuropathy, and septic arthritis of right ankle. The patient was hospitalized at [facility] May 2018 for septic right ankle. At that time there was no open wound. Podiatrist recommended to discontinue antibiotics given during the hospitalization. He had previously been treated with Bactrim for 2 weeks. Unfortunately the ankle became worse and he developed an open wound. He also underwent intraoperative I&D on 6/14 with cultures being positive for MSSA and he was started on amoxicillin. He was evaluated on 6/29 by ID and changed to IV cefazolin via PICC line with this drug actually initiated on 7/3. He completed 6 weeks of antibiotics on 8/14. Shortly after completing these antibiotics he noticed increased swelling and drainage from the ankle and increasing severe shooting type pains. He followed up with his podiatrist on 8/20 and wound cultures again were positive for MSSA. He was started on Bactrim. Oral ciprofloxacin was added on 8/22. Today he followed up with the ID clinic/Dr. Orvan Falconer complaining of extreme weakness, no appetite, and weight loss of 20 pounds. Clinically he appeared pale and was tachycardic with SBP in the 90s. He was sent directly from the office to the hospital for admission. Of note the ankle wound which had nearly healed from the previous surgery and  antibiotic treatment has now opened and is at least 3 x 4 cm.   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, per patient became significantly worse after finishing IV antibiotics - MRI of the right foot showed diffuse osteomyelitis involving all the bones of the hindfoot and midfoot, basis of second through fifth metatarsals, TB or talar and subtalar joint septic arthritis with large loculated abscess along the medial and lateral ankle which communicate with the tibiotalar joint, 6.6 cm - follow intraoperative cultures, continue IV Ancef, PICC line placed, ID following - reviewed recommendations from Dr. Clayburn Pert, high risk for below-knee amputation, recommended long-term IV antibiotics per ID unless patient considers BKA   Active Problems:   Type II diabetes mellitus (HCC) now with hyperglycemia - increase Lantus to 75 units at bedtime, 10 units given this morning - NovoLog 4 units TID with meals, continue sliding scale insulin moderate     HTN (hypertension) -BP now stable, continue to hold antihypertensives this morning    Acute kidney injury (HCC)acute hyperkalemia, metabolic acidosis - Secondary to  dehydration, hypoperfusion, NSAIDs and ARB - Continue to hold offending medications, aggressive IV fluid hydration - creatinine improved to 0.98,    Acute hyperkalemia - possibly due to NSAIDS, ARB -potassium today 5.0    Anemia, chronic disease - currently stable, continue close monitoring, baseline hemoglobin 8.3  Hyperlipidemia - Continue Lipitor  BPH, no urinary obstruction - continue Flomax  Code Status:  full DVT Prophylaxis:  Lovenox  Family Communication: Discussed in detail with the patient, all imaging results, lab results explained to the patient and wife at the bedside   Disposition Plan:   Time Spent in minutes   25 minutes  Procedures:  MRI of the right foot I&D of  the right foot and ankle abscess  Consultants:   Infectious disease Podiatry  Antimicrobials:   IV Ancef, 8/28 >   Medications  Scheduled Meds: . aspirin EC  81 mg Oral Daily  . atorvastatin  40 mg Oral q1800  . enoxaparin (LOVENOX) injection  40 mg Subcutaneous Daily  . ferrous sulfate  325 mg Oral TID WC  . gabapentin  1,800 mg Oral TID  . insulin aspart  0-15 Units Subcutaneous TID WC  . insulin aspart  0-5 Units Subcutaneous QHS  . insulin aspart  4 Units Subcutaneous TID WC  . insulin glargine  75 Units Subcutaneous QHS  . PARoxetine  20 mg Oral Daily  . sodium chloride flush  3 mL Intravenous Q12H  . tamsulosin  0.4 mg Oral Daily   Continuous Infusions: . sodium chloride 150 mL/hr at 03/13/17 0535  . sodium chloride    .  ceFAZolin (ANCEF) IV 2 g (03/13/17 0536)  . lactated ringers 10 mL/hr at 03/12/17 1613   PRN Meds:.sodium chloride, acetaminophen, HYDROcodone-acetaminophen, morphine injection, morphine injection, ondansetron **OR** ondansetron (ZOFRAN) IV, oxyCODONE-acetaminophen, promethazine, sodium chloride flush, sodium chloride flush   Antibiotics   Anti-infectives    Start     Dose/Rate Route Frequency Ordered Stop   03/10/17 1230  ceFAZolin (ANCEF) IVPB 2g/100 mL premix     2 g 200 mL/hr over 30 Minutes Intravenous Every 8 hours 03/10/17 1158          Subjective:   Keith Swails was seen and examined today. Feeling better today after right foot surgery, apprehensive that he may have to go back for the surgery again for repeat I&D.  Patient denies dizziness, chest pain, shortness of breath, abdominal pain, N/V/D/C, new weakness, numbess, tingling.   Objective:   Vitals:   03/12/17 1815 03/12/17 1853 03/12/17 1949 03/13/17 0503  BP:  (!) 110/59 121/63 125/71  Pulse:  89 95 96  Resp:  18 18 18   Temp: 97.8 F (36.6 C) (!) 97.5 F (36.4 C) (!) 97.4 F (36.3 C) 97.9 F (36.6 C)  TempSrc:   Oral Oral  SpO2:  95% 97%   Weight:      Height:          Intake/Output Summary (Last 24 hours) at 03/13/17 1335 Last data filed at 03/13/17 0900  Gross per 24 hour  Intake           3509.5 ml  Output             1900 ml  Net           1609.5 ml     Wt Readings from Last 3 Encounters:  03/12/17 105.4 kg (232 lb 6.4 oz)  03/10/17 102.5 kg (226 lb)  02/19/17 109.8 kg (242 lb)  Exam General: Alert and oriented x 3, NAD Eyes:  HEENT:   Cardiovascular: S1 S2 auscultated, no rubs, murmurs or gallops. Regular rate and rhythm. No pedal edema b/l Respiratory: Clear to auscultation bilaterally, no wheezing, rales or rhonchi Gastrointestinal: Soft, nontender, nondistended, + bowel sounds Ext: no pedal edema bilaterally Neuro: no new deficits Musculoskeletal: No digital cyanosis, clubbing Skin:right foot dressing intact Psych: Normal affect and demeanor, alert and oriented x3      Data Reviewed:  I have personally reviewed following labs and imaging studies  Micro Results Recent Results (from the past 240 hour(s))  Culture, blood (Routine X 2) w Reflex to ID Panel     Status: None (Preliminary result)   Collection Time: 03/10/17 12:23 PM  Result Value Ref Range Status   Specimen Description BLOOD RIGHT ANTECUBITAL  Final   Special Requests IN PEDIATRIC BOTTLE Blood Culture adequate volume  Final   Culture NO GROWTH 2 DAYS  Final   Report Status PENDING  Incomplete  Culture, blood (Routine X 2) w Reflex to ID Panel     Status: None (Preliminary result)   Collection Time: 03/10/17 12:31 PM  Result Value Ref Range Status   Specimen Description BLOOD RIGHT ANTECUBITAL  Final   Special Requests IN PEDIATRIC BOTTLE Blood Culture adequate volume  Final   Culture NO GROWTH 2 DAYS  Final   Report Status PENDING  Incomplete  Surgical pcr screen     Status: None   Collection Time: 03/11/17 10:02 PM  Result Value Ref Range Status   MRSA, PCR NEGATIVE NEGATIVE Final   Staphylococcus aureus NEGATIVE NEGATIVE Final    Comment:  (NOTE) The Xpert SA Assay (FDA approved for NASAL specimens in patients 81 years of age and older), is one component of a comprehensive surveillance program. It is not intended to diagnose infection nor to guide or monitor treatment.   Aerobic/Anaerobic Culture (surgical/deep wound)     Status: None (Preliminary result)   Collection Time: 03/12/17  5:25 PM  Result Value Ref Range Status   Specimen Description ABSCESS RIGHT ANKLE  Final   Special Requests NONE  Final   Gram Stain   Final    MODERATE WBC PRESENT,BOTH PMN AND MONONUCLEAR NO ORGANISMS SEEN    Culture PENDING  Incomplete   Report Status PENDING  Incomplete    Radiology Reports Mr Ankle Right W Wo Contrast  Result Date: 03/11/2017 CLINICAL DATA:  Right ankle osteomyelitis. EXAM: MRI OF THE RIGHT ANKLE WITHOUT AND WITH CONTRAST TECHNIQUE: Multiplanar, multisequence MR imaging of the ankle was performed before and after the administration of intravenous contrast. CONTRAST:  20mL MULTIHANCE GADOBENATE DIMEGLUMINE 529 MG/ML IV SOLN COMPARISON:  Right ankle x-rays dated December 12, 2016. Right foot MRI dated Nov 27, 2016. FINDINGS: TENDONS Peroneal: Mildly thickened. Small amount of fluid in the tendon sheath at the retromalleolar groove. Additional small amount of fluid in the peroneus brevis tendon sheath near its insertion on the base of fifth metatarsal. Posteromedial: Grossly intact. Anterior: Intact. Achilles: Intact. Plantar Fascia: Mild thickening of the plantar fascia. LIGAMENTS Lateral: The anterior talofibular and calcaneofibular ligaments are attenuated and likely chronically torn. Medial: Grossly intact. CARTILAGE Ankle Joint: There is a large ankle joint effusion with enhancing synovium. Subtalar Joints/Sinus Tarsi: Moderate subtalar joint effusion with enhancing synovium. Abnormal signal within the sinus tarsi. Bones: There is prominent, enhancing marrow edema with corresponding T1 hypointensity and cortical destruction  involving the distal tibia, distal fibula, talus, calcaneus, cuboid, navicular, all 3 cuneiforms,  and the bases of the second through fifth metatarsals. There is a chronic fracture through the talar neck. Other: There is an open soft tissue defect along the medial ankle. There is a large multiloculated, rim enhancing fluid collection along the medial ankle, measuring approximately 6.2 x 2.4 x 6.6 cm (AP by transverse by CC) . This fluid collection likely communicates with the ankle joint and courses around the flexor hallucis longus muscle and tendon. There is an additional multiloculated 4.8 x 1.6 x 3.7 cm (AP by transverse by CC) rim enhancing fluid collection along the lateral ankle which also communicates with the tibiotalar joint. IMPRESSION: IMPRESSION 1. Diffuse osteomyelitis involving all of the bones of the the hindfoot and midfoot, as well as the bases of the second through fifth metatarsals, as described above, with chronic fracture through the talar neck. 2. Tibiotalar and subtalar joint septic arthritis with large, loculated abscesses along the medial and lateral ankle, which communicate with the tibiotalar joint. The larger fluid collection along the medial ankle measures up to 6.6 cm. 3. Small amount of fluid in the peroneal tendon sheath and distal peroneus brevis tendon sheath, concerning for infectious tenosynovitis. Likely infectious tenosynovitis of the flexor hallucis longus as well. Electronically Signed   By: Obie Dredge M.D.   On: 03/11/2017 16:49    Lab Data:  CBC:  Recent Labs Lab 03/10/17 1227 03/11/17 0440 03/12/17 0437 03/12/17 1645 03/13/17 0421  WBC 13.8* 10.8* 10.7*  --  10.1  NEUTROABS 12.1*  --   --   --   --   HGB 10.2* 9.4* 9.3* 10.5* 8.8*  HCT 33.0* 30.7* 30.8* 31.0* 29.3*  MCV 79.3 79.7 80.2  --  79.4  PLT 355 369 360  --  358   Basic Metabolic Panel:  Recent Labs Lab 03/10/17 1227 03/10/17 1747 03/11/17 0440 03/11/17 1407 03/12/17 0437  03/12/17 1645 03/13/17 0421  NA 132* 133* 135  --  132* 137 131*  K 6.6* 5.4* 6.2* 6.1* 6.0* 5.3* 5.0  CL 103 104 104  --  104  --  100*  CO2 20* 20* 23  --  22  --  23  GLUCOSE 92 153* 105*  --  166* 121* 344*  BUN 56* 50* 37*  --  27*  --  27*  CREATININE 2.48* 1.94* 1.60*  --  1.18  --  0.98  CALCIUM 10.6* 10.2 10.2  --  10.0  --  9.3  MG 2.1  --   --   --   --   --   --    GFR: Estimated Creatinine Clearance: 118.2 mL/min (by C-G formula based on SCr of 0.98 mg/dL). Liver Function Tests:  Recent Labs Lab 03/10/17 1227  AST 72*  ALT 69*  ALKPHOS 104  BILITOT 0.7  PROT 8.2*  ALBUMIN 2.7*   No results for input(s): LIPASE, AMYLASE in the last 168 hours. No results for input(s): AMMONIA in the last 168 hours. Coagulation Profile: No results for input(s): INR, PROTIME in the last 168 hours. Cardiac Enzymes: No results for input(s): CKTOTAL, CKMB, CKMBINDEX, TROPONINI in the last 168 hours. BNP (last 3 results) No results for input(s): PROBNP in the last 8760 hours. HbA1C: No results for input(s): HGBA1C in the last 72 hours. CBG:  Recent Labs Lab 03/12/17 1756 03/12/17 1945 03/12/17 2228 03/13/17 0846 03/13/17 1125  GLUCAP 116* 140* 320* 275* 228*   Lipid Profile: No results for input(s): CHOL, HDL, LDLCALC, TRIG, CHOLHDL, LDLDIRECT in the  last 72 hours. Thyroid Function Tests: No results for input(s): TSH, T4TOTAL, FREET4, T3FREE, THYROIDAB in the last 72 hours. Anemia Panel: No results for input(s): VITAMINB12, FOLATE, FERRITIN, TIBC, IRON, RETICCTPCT in the last 72 hours. Urine analysis:    Component Value Date/Time   COLORURINE GREEN (A) 11/06/2016 1758   APPEARANCEUR CLOUDY (A) 11/06/2016 1758   LABSPEC 1.018 11/06/2016 1758   PHURINE 5.0 11/06/2016 1758   GLUCOSEU NEGATIVE 11/06/2016 1758   HGBUR SMALL (A) 11/06/2016 1758   BILIRUBINUR NEGATIVE 11/06/2016 1758   KETONESUR NEGATIVE 11/06/2016 1758   PROTEINUR 100 (A) 11/06/2016 1758   NITRITE  NEGATIVE 11/06/2016 1758   LEUKOCYTESUR LARGE (A) 11/06/2016 1758     Norvella Loscalzo M.D. [facility] 03/13/2017, 1:35 PM  Pager: [phone] Between 7am to 7pm - call Pager - [phone]  After 7pm go to [email or web address] - password [email or web address]  Call night coverage person covering after 7pm

## 2017-03-13 NOTE — Progress Notes (Addendum)
Patient ID: Keith [name], male   DOB: 1971/05/13, 46 y.o.   MRN: [id number]          [facility] for Infectious Disease  Date of Admission:  03/10/2017           Day 3 cefazolin ASSESSMENT: He has severe right ankle infection was septic arthritis, osteomyelitis and a large medial abscess. A large volume of pus under pressure was encountered when Dr. Logan Bores did surgery yesterday. Keith [name] told me that he is willing to do anything he can to try to save his foot. No organisms were seen on the operative Gram stain. Cultures are pending. I plan to give him at least 8 weeks of IV antibiotic therapy for severe septic arthritis and osteomyelitis of his right ankle and foot.  PLAN: 1. Continue cefazolin 2. Await culture results  Principal Problem:   Osteomyelitis of ankle, right, acute (HCC) Active Problems:   Type II diabetes mellitus (HCC)   HTN (hypertension)   Hypertension   Acute kidney injury (HCC)   Acute hyperkalemia   Anemia, chronic disease   . aspirin EC  81 mg Oral Daily  . atorvastatin  40 mg Oral q1800  . enoxaparin (LOVENOX) injection  40 mg Subcutaneous Daily  . ferrous sulfate  325 mg Oral TID WC  . gabapentin  1,800 mg Oral TID  . insulin aspart  0-15 Units Subcutaneous TID WC  . insulin aspart  0-5 Units Subcutaneous QHS  . insulin aspart  4 Units Subcutaneous TID WC  . insulin glargine  75 Units Subcutaneous QHS  . PARoxetine  20 mg Oral Daily  . sodium chloride flush  3 mL Intravenous Q12H  . tamsulosin  0.4 mg Oral Daily    SUBJECTIVE: He is feeling a little bit better today.  Review of Systems: Review of Systems  Constitutional: Positive for fever, malaise/fatigue and weight loss. Negative for chills and diaphoresis.  Gastrointestinal: Negative for abdominal pain, diarrhea, nausea and vomiting.  Musculoskeletal: Positive for joint pain.  Skin: Negative for rash.    Allergies  Allergen Reactions  . Daptomycin Anaphylaxis and Shortness Of Breath     Breathing issues  . Lisinopril Other (See Comments)    Cough    OBJECTIVE: Vitals:   03/12/17 1853 03/12/17 1949 03/13/17 0503 03/13/17 1400  BP: (!) 110/59 121/63 125/71 121/66  Pulse: 89 95 96 92  Resp: 18 18 18 20   Temp: (!) 97.5 F (36.4 C) (!) 97.4 F (36.3 C) 97.9 F (36.6 C) 98.2 F (36.8 C)  TempSrc:  Oral Oral Oral  SpO2: 95% 97%  96%  Weight:      Height:       Body mass index is 31.52 kg/m.  Physical Exam  Constitutional: He is oriented to person, place, and time.  He is in good spirits. His wife, Toni Amend, is at the bedside.  Cardiovascular: Regular rhythm.   No murmur heard. He is still mildly tachycardic.  Pulmonary/Chest: Effort normal and breath sounds normal.  Abdominal: Soft. There is no tenderness.  Musculoskeletal:  There is some drainage on his bulky gauze dressing on his right foot.  Neurological: He is alert and oriented to person, place, and time.  Skin: No rash noted.  Psychiatric: Mood and affect normal.    Lab Results Lab Results  Component Value Date   WBC 10.1 03/13/2017   HGB 8.8 (L) 03/13/2017   HCT 29.3 (L) 03/13/2017   MCV 79.4 03/13/2017   PLT 358 03/13/2017  Lab Results  Component Value Date   CREATININE 0.98 03/13/2017   BUN 27 (H) 03/13/2017   NA 131 (L) 03/13/2017   K 5.0 03/13/2017   CL 100 (L) 03/13/2017   CO2 23 03/13/2017    Lab Results  Component Value Date   ALT 69 (H) 03/10/2017   AST 72 (H) 03/10/2017   ALKPHOS 104 03/10/2017   BILITOT 0.7 03/10/2017    Sed Rate (mm/hr)  Date Value  03/10/2017 123 (H)  01/09/2017 71 (H)  12/12/2016 103 (H)   CRP (mg/L)  Date Value  01/09/2017 27.3 (H)   Microbiology: Recent Results (from the past 240 hour(s))  Culture, blood (Routine X 2) w Reflex to ID Panel     Status: None (Preliminary result)   Collection Time: 03/10/17 12:23 PM  Result Value Ref Range Status   Specimen Description BLOOD RIGHT ANTECUBITAL  Final   Special Requests IN PEDIATRIC  BOTTLE Blood Culture adequate volume  Final   Culture NO GROWTH 2 DAYS  Final   Report Status PENDING  Incomplete  Culture, blood (Routine X 2) w Reflex to ID Panel     Status: None (Preliminary result)   Collection Time: 03/10/17 12:31 PM  Result Value Ref Range Status   Specimen Description BLOOD RIGHT ANTECUBITAL  Final   Special Requests IN PEDIATRIC BOTTLE Blood Culture adequate volume  Final   Culture NO GROWTH 2 DAYS  Final   Report Status PENDING  Incomplete  Surgical pcr screen     Status: None   Collection Time: 03/11/17 10:02 PM  Result Value Ref Range Status   MRSA, PCR NEGATIVE NEGATIVE Final   Staphylococcus aureus NEGATIVE NEGATIVE Final    Comment: (NOTE) The Xpert SA Assay (FDA approved for NASAL specimens in patients 46 years of age and older), is one component of a comprehensive surveillance program. It is not intended to diagnose infection nor to guide or monitor treatment.   Aerobic/Anaerobic Culture (surgical/deep wound)     Status: None (Preliminary result)   Collection Time: 03/12/17  5:25 PM  Result Value Ref Range Status   Specimen Description ABSCESS RIGHT ANKLE  Final   Special Requests NONE  Final   Gram Stain   Final    MODERATE WBC PRESENT,BOTH PMN AND MONONUCLEAR NO ORGANISMS SEEN    Culture CULTURE REINCUBATED FOR BETTER GROWTH  Final   Report Status PENDING  Incomplete   MRI right ankle 03/11/2017  IMPRESSION 1. Diffuse osteomyelitis involving all of the bones of the the hindfoot and midfoot, as well as the bases of the second through fifth metatarsals, as described above, with chronic fracture through the talar neck. 2. Tibiotalar and subtalar joint septic arthritis with large, loculated abscesses along the medial and lateral ankle, which communicate with the tibiotalar joint. The larger fluid collection along the medial ankle measures up to 6.6 cm. 3. Small amount of fluid in the peroneal tendon sheath and distal peroneus brevis tendon  sheath, concerning for infectious tenosynovitis. Likely infectious tenosynovitis of the flexor hallucis longus as well.   Electronically Signed   By: Obie DredgeWilliam T Derry M.D.   On: 03/11/2017 16:49  Cliffton AstersJohn Marget Outten, MD [facility] [phone] pager   [phone] cell 03/13/2017, 3:40 PM

## 2017-03-13 NOTE — Anesthesia Postprocedure Evaluation (Signed)
Anesthesia Post Note  Patient: Keith [name]  Procedure(s) Performed: Procedure(s) (LRB): IRRIGATION AND DEBRIDEMENT RIGHT ANKLE (Right)     Patient location during evaluation: PACU Anesthesia Type: General Level of consciousness: awake and alert, responds to stimulation and patient cooperative Pain management: pain level controlled Vital Signs Assessment: post-procedure vital signs reviewed and stable Respiratory status: spontaneous breathing Cardiovascular status: blood pressure returned to baseline and stable Postop Assessment: no headache Anesthetic complications: no    Last Vitals:  Vitals:   03/12/17 1949 03/13/17 0503  BP: 121/63 125/71  Pulse: 95 96  Resp: 18 18  Temp: (!) 36.3 C 36.6 C  SpO2: 97%     Last Pain:  Vitals:   03/13/17 0503  TempSrc: Oral  PainSc:                  [name], [name]

## 2017-03-14 LAB — CBC
HEMATOCRIT: 25.6 % — AB (ref 39.0–52.0)
HEMOGLOBIN: 7.9 g/dL — AB (ref 13.0–17.0)
MCH: 24.4 pg — AB (ref 26.0–34.0)
MCHC: 30.9 g/dL (ref 30.0–36.0)
MCV: 79 fL (ref 78.0–100.0)
PLATELETS: 355 10*3/uL (ref 150–400)
RBC: 3.24 MIL/uL — AB (ref 4.22–5.81)
RDW: 17.7 % — ABNORMAL HIGH (ref 11.5–15.5)
WBC: 6.6 10*3/uL (ref 4.0–10.5)

## 2017-03-14 LAB — BASIC METABOLIC PANEL
Anion gap: 6 (ref 5–15)
BUN: 24 mg/dL — ABNORMAL HIGH (ref 6–20)
CHLORIDE: 107 mmol/L (ref 101–111)
CO2: 24 mmol/L (ref 22–32)
CREATININE: 0.91 mg/dL (ref 0.61–1.24)
Calcium: 9 mg/dL (ref 8.9–10.3)
GFR calc non Af Amer: >60 mL/min (ref >60)
Glucose, Bld: 126 mg/dL — ABNORMAL HIGH (ref 65–99)
POTASSIUM: 4.2 mmol/L (ref 3.5–5.1)
Sodium: 137 mmol/L (ref 135–145)

## 2017-03-14 LAB — GLUCOSE, CAPILLARY
GLUCOSE-CAPILLARY: 148 mg/dL — AB (ref 65–99)
GLUCOSE-CAPILLARY: 249 mg/dL — AB (ref 65–99)
Glucose-Capillary: 81 mg/dL (ref 65–99)
Glucose-Capillary: 164 mg/dL — ABNORMAL HIGH (ref 65–99)

## 2017-03-14 LAB — HEMOGLOBIN AND HEMATOCRIT, BLOOD
HCT: 30.4 % — ABNORMAL LOW (ref 39.0–52.0)
Hemoglobin: 9.6 g/dL — ABNORMAL LOW (ref 13.0–17.0)

## 2017-03-14 LAB — PREPARE RBC (CROSSMATCH)

## 2017-03-14 LAB — ABO/RH: ABO/RH(D): O POS

## 2017-03-14 MED ORDER — SODIUM CHLORIDE 0.9 % IV SOLN
Freq: Once | INTRAVENOUS | Status: AC
  Administered 2017-03-14: 250 mL via INTRAVENOUS

## 2017-03-14 NOTE — Progress Notes (Signed)
   PROGRESS NOTE PODIATRY  HPI: Patient status post incision and drainage with irrigation and debridement of right foot and ankle. Surgery performed 03/12/2017. Patient states that he is doing well and feels much better postoperatively. Patient currently on IV antibiotics as per infectious disease.  Dressings on the surgical extremity do have some strikethrough, however there were reinforced by the nurse postoperatively.  Past Medical History:  Diagnosis Date  . Diabetic peripheral neuropathy (HCC)   . Hyperlipidemia   . Hypertension   . Type I diabetes mellitus [facility](HCC)    Patient Active Problem List   Diagnosis Date Noted  . Hypertension 03/10/2017  . Acute kidney injury (HCC) 03/10/2017  . Acute hyperkalemia 03/10/2017  . Anemia, chronic disease 03/10/2017  . Septic arthritis of ankle or foot (HCC) 01/09/2017  . Type II diabetes mellitus (HCC) 01/09/2017  . HTN (hypertension) 01/09/2017  . Neuropathy 01/09/2017  . BPH (benign prostatic hyperplasia) 01/09/2017  . Anemia 12/26/2016  . Osteomyelitis of ankle, right, acute (HCC) 11/27/2016  . Diabetic ulcer of right foot associated with diabetes mellitus due to underlying condition (HCC) 03/31/2016  . LPRD (laryngopharyngeal reflux disease) 09/19/2014  . Hypercholesterolemia 08/01/2014  . Diabetic polyneuropathy associated with type 1 diabetes mellitus (HCC) 07/24/2014     Objective: General: The patient is alert and oriented x3 in no acute distress.  Physical exam: Open incision sites to the medial and lateral aspect of the foot and ankle were examined upon removal dressings. No active bleeding noted. Soft tissue structures within the incision site appear healthier then intraoperatively. There is no excessive amount of purulent drainage noted. No malodor. Neurovascular status intact.  CBC    Component Value Date/Time   WBC 6.6 03/14/2017 0414   RBC 3.24 (L) 03/14/2017 0414   HGB 7.9 (L) 03/14/2017 0414   HCT 25.6 (L)  03/14/2017 0414   PLT 355 03/14/2017 0414   MCV 79.0 03/14/2017 0414   MCH 24.4 (L) 03/14/2017 0414   MCHC 30.9 03/14/2017 0414   RDW 17.7 (H) 03/14/2017 0414   LYMPHSABS 0.9 03/10/2017 1227   MONOABS 0.6 03/10/2017 1227   EOSABS 0.2 03/10/2017 1227   BASOSABS 0.0 03/10/2017 1227    Assessment: 1. Status post incision and drainage of septic arthritis and cellulitis right foot and ankle 2. Excessive bone marrow edema right foot and ankle 3. Diabetes mellitus with polyneuropathy   Plan of Care:  1. Patient was evaluated. 2. Patient understands that he is at high risk of proximal amputation.  3. Patient may require serial debridements. We did discuss the possibility of delayed primary closure with debridement and irrigation next week. The plan going forward is to continue IV antibiotics per infectious disease and resume good conservative wound care. Orders for dressing changes every other day placed.  4. We will reassess the patient's condition and discuss possible returning to the OR after the long weekend.    Felecia ShellingBrent M. Zyan Coby, DPM [facility]  Dr. Felecia ShellingBrent M. Hovanes Hymas, DPM    [address]                                        [place], [place] [address]                Office [phone]  Fax [phone]

## 2017-03-14 NOTE — Progress Notes (Signed)
    [facility]   Reason for visit: Follow up on osteomyelitis  Interval History: culture growing Staph aureus.  Potentially further washout Tuesday.    Physical Exam: Constitutional:  Vitals:   03/14/17 1421 03/14/17 1436  BP: 129/71 126/73  Pulse: 91 94  Resp: 18 18  Temp: 97.8 F (36.6 C) 98.2 F (36.8 C)  SpO2: 99% 99%   patient appears in NAD  Impression: Stable osteomyelitis  Plan: 1.  Continue cefazolin and monitor cultures.

## 2017-03-14 NOTE — Progress Notes (Signed)
Per MD order dressing on right foot will be changed on 03/15/2017; the order is to be changed every other day and the dressing was changed on 03/13/2017. Will continue to monitor.

## 2017-03-14 NOTE — Progress Notes (Signed)
[facility]                                                                              Patient Demographics  Keith [name], is a 46 y.o. male, DOB - [birth year], WUJ:[id number]  Admit date - 03/10/2017   Admitting Physician Ozella Rocks, MD  Outpatient Primary MD for the patient is [facility]  Outpatient specialists:   LOS - 4  days   Medical records reviewed and are as summarized below:    No chief complaint on file.      Brief summary   Keith E Doris is a 46 y.o. male with medical history significant for diabetes mellitus 2 on insulin, obesity, BPH on Flomax, hypertension, dyslipidemia, peripheral neuropathy, and septic arthritis of right ankle. The patient was hospitalized at [facility] May 2018 for septic right ankle. At that time there was no open wound. Podiatrist recommended to discontinue antibiotics given during the hospitalization. He had previously been treated with Bactrim for 2 weeks. Unfortunately the ankle became worse and he developed an open wound. He also underwent intraoperative I&D on 6/14 with cultures being positive for MSSA and he was started on amoxicillin. He was evaluated on 6/29 by ID and changed to IV cefazolin via PICC line with this drug actually initiated on 7/3. He completed 6 weeks of antibiotics on 8/14. Shortly after completing these antibiotics he noticed increased swelling and drainage from the ankle and increasing severe shooting type pains. He followed up with his podiatrist on 8/20 and wound cultures again were positive for MSSA. He was started on Bactrim. Oral ciprofloxacin was added on 8/22. Today he followed up with the ID clinic/Dr. Orvan Falconer complaining of extreme weakness, no appetite, and weight loss of 20 pounds. Clinically he appeared pale and was tachycardic with SBP in the 90s. He was sent directly from the office to the hospital for admission. Of note the ankle wound which had nearly healed from the previous surgery and  antibiotic treatment has now opened and is at least 3 x 4 cm.   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, per patient became significantly worse after finishing IV antibiotics - MRI of the right foot showed diffuse osteomyelitis involving all the bones of the hindfoot and midfoot, basis of second through fifth metatarsals, TB or talar and subtalar joint septic arthritis with large loculated abscess along the medial and lateral ankle which communicate with the tibiotalar joint, 6.6 cm - or I&D done on 8/31 by Dr Logan Bores, high risk for below-knee amputation, recommended long-term IV antibiotics per ID unless patient considers BKA - continue IV cefazolin, await culture results, ID following - per patient, Dr. Logan Bores is planning on repeat I&D on Tuesday  Active Problems: Anemia, normocytic,postoperative blood loss and possibly hemodilution - DC IV fluids, 2 units of packed RBC transfusion    Type II diabetes mellitus (HCC) now with hyperglycemia -continue Lantus 75 units at bedtime, NovoLog meal coverage 4 units TID - continue sliding scale insulin moderate     HTN (hypertension) -  BP now stable, continue to hold antihypertensives    Acute kidney injury (HCC)acute hyperkalemia, metabolic acidosis - Secondary to dehydration, hypoperfusion, NSAIDs and ARB - Continue to hold offending medications, aggressive IV fluid hydration - creatinine improved to 0.98,    Acute hyperkalemia - possibly due to NSAIDS, ARB -resolved    Anemia, chronic disease - currently stable, continue close monitoring, baseline hemoglobin 8.3  Hyperlipidemia - Continue Lipitor  BPH, no urinary obstruction - continue Flomax  Code Status:  full DVT Prophylaxis:  Lovenox  Family Communication: Discussed in detail with the patient, all imaging results, lab results explained to the patient   Disposition Plan:   Time  Spent in minutes   25 minutes  Procedures:  MRI of the right foot I&D of the right foot and ankle abscess  Consultants:   Infectious disease Podiatry  Antimicrobials:   IV Ancef, 8/28 >   Medications  Scheduled Meds: . aspirin EC  81 mg Oral Daily  . atorvastatin  40 mg Oral q1800  . enoxaparin (LOVENOX) injection  40 mg Subcutaneous Daily  . ferrous sulfate  325 mg Oral TID WC  . gabapentin  1,800 mg Oral TID  . insulin aspart  0-15 Units Subcutaneous TID WC  . insulin aspart  0-5 Units Subcutaneous QHS  . insulin aspart  4 Units Subcutaneous TID WC  . insulin glargine  75 Units Subcutaneous QHS  . PARoxetine  20 mg Oral Daily  . sodium chloride flush  3 mL Intravenous Q12H  . tamsulosin  0.4 mg Oral Daily   Continuous Infusions: . sodium chloride    . sodium chloride    .  ceFAZolin (ANCEF) IV Stopped (03/14/17 1610)  . lactated ringers 10 mL/hr at 03/12/17 1613   PRN Meds:.sodium chloride, acetaminophen, HYDROcodone-acetaminophen, morphine injection, morphine injection, ondansetron **OR** ondansetron (ZOFRAN) IV, oxyCODONE-acetaminophen, promethazine, sodium chloride flush, sodium chloride flush   Antibiotics   Anti-infectives    Start     Dose/Rate Route Frequency Ordered Stop   03/10/17 1230  ceFAZolin (ANCEF) IVPB 2g/100 mL premix     2 g 200 mL/hr over 30 Minutes Intravenous Every 8 hours 03/10/17 1158          Subjective:   Keith Canning was seen and examined today. Somewhat overwhelmed with surgery and may need another surgery on Tuesday. No fevers or chills.  Patient denies dizziness, chest pain, shortness of breath, abdominal pain, N/V/D/C, new weakness, numbess, tingling.   Objective:   Vitals:   03/13/17 1400 03/13/17 2014 03/14/17 0008 03/14/17 0400  BP: 121/66 112/66 124/71 118/74  Pulse: 92 92 88 84  Resp: 20 16 16 18   Temp: 98.2 F (36.8 C) 97.8 F (36.6 C) (!) 97.5 F (36.4 C) 98 F (36.7 C)  TempSrc: Oral Oral Oral Oral  SpO2: 96%  97% 98% 99%  Weight:      Height:        Intake/Output Summary (Last 24 hours) at 03/14/17 1055 Last data filed at 03/14/17 1036  Gross per 24 hour  Intake           5227.5 ml  Output             3575 ml  Net           1652.5 ml     Wt Readings from Last 3 Encounters:  03/12/17 105.4 kg (232 lb 6.4 oz)  03/10/17 102.5 kg (226 lb)  02/19/17 109.8 kg (242 lb)     Exam  General: Alert and oriented x 3, NAD Eyes:  HEENT:   Cardiovascular: S1 S2 auscultated, no rubs, murmurs or gallops. Regular rate and rhythm. No pedal edema b/l Respiratory: Clear to auscultation bilaterally, no wheezing, rales or rhonchi Gastrointestinal: Soft, nontender, nondistended, + bowel sounds Ext: no pedal edema bilaterally Neuro:no new deficits Musculoskeletal: No digital cyanosis, clubbing Skin:right foot dressing intact Psych: Normal affect and demeanor, alert and oriented x3      Data Reviewed:  I have personally reviewed following labs and imaging studies  Micro Results Recent Results (from the past 240 hour(s))  Culture, blood (Routine X 2) w Reflex to ID Panel     Status: None (Preliminary result)   Collection Time: 03/10/17 12:23 PM  Result Value Ref Range Status   Specimen Description BLOOD RIGHT ANTECUBITAL  Final   Special Requests IN PEDIATRIC BOTTLE Blood Culture adequate volume  Final   Culture NO GROWTH 4 DAYS  Final   Report Status PENDING  Incomplete  Culture, blood (Routine X 2) w Reflex to ID Panel     Status: None (Preliminary result)   Collection Time: 03/10/17 12:31 PM  Result Value Ref Range Status   Specimen Description BLOOD RIGHT ANTECUBITAL  Final   Special Requests IN PEDIATRIC BOTTLE Blood Culture adequate volume  Final   Culture NO GROWTH 4 DAYS  Final   Report Status PENDING  Incomplete  Surgical pcr screen     Status: None   Collection Time: 03/11/17 10:02 PM  Result Value Ref Range Status   MRSA, PCR NEGATIVE NEGATIVE Final   Staphylococcus aureus  NEGATIVE NEGATIVE Final    Comment: (NOTE) The Xpert SA Assay (FDA approved for NASAL specimens in patients 67 years of age and older), is one component of a comprehensive surveillance program. It is not intended to diagnose infection nor to guide or monitor treatment.   Aerobic/Anaerobic Culture (surgical/deep wound)     Status: None (Preliminary result)   Collection Time: 03/12/17  5:25 PM  Result Value Ref Range Status   Specimen Description ABSCESS RIGHT ANKLE  Final   Special Requests NONE  Final   Gram Stain   Final    MODERATE WBC PRESENT,BOTH PMN AND MONONUCLEAR NO ORGANISMS SEEN    Culture CULTURE REINCUBATED FOR BETTER GROWTH  Final   Report Status PENDING  Incomplete    Radiology Reports Mr Ankle Right W Wo Contrast  Result Date: 03/11/2017 CLINICAL DATA:  Right ankle osteomyelitis. EXAM: MRI OF THE RIGHT ANKLE WITHOUT AND WITH CONTRAST TECHNIQUE: Multiplanar, multisequence MR imaging of the ankle was performed before and after the administration of intravenous contrast. CONTRAST:  20mL MULTIHANCE GADOBENATE DIMEGLUMINE 529 MG/ML IV SOLN COMPARISON:  Right ankle x-rays dated December 12, 2016. Right foot MRI dated Nov 27, 2016. FINDINGS: TENDONS Peroneal: Mildly thickened. Small amount of fluid in the tendon sheath at the retromalleolar groove. Additional small amount of fluid in the peroneus brevis tendon sheath near its insertion on the base of fifth metatarsal. Posteromedial: Grossly intact. Anterior: Intact. Achilles: Intact. Plantar Fascia: Mild thickening of the plantar fascia. LIGAMENTS Lateral: The anterior talofibular and calcaneofibular ligaments are attenuated and likely chronically torn. Medial: Grossly intact. CARTILAGE Ankle Joint: There is a large ankle joint effusion with enhancing synovium. Subtalar Joints/Sinus Tarsi: Moderate subtalar joint effusion with enhancing synovium. Abnormal signal within the sinus tarsi. Bones: There is prominent, enhancing marrow edema with  corresponding T1 hypointensity and cortical destruction involving the distal tibia, distal fibula, talus, calcaneus, cuboid, navicular, all  3 cuneiforms, and the bases of the second through fifth metatarsals. There is a chronic fracture through the talar neck. Other: There is an open soft tissue defect along the medial ankle. There is a large multiloculated, rim enhancing fluid collection along the medial ankle, measuring approximately 6.2 x 2.4 x 6.6 cm (AP by transverse by CC) . This fluid collection likely communicates with the ankle joint and courses around the flexor hallucis longus muscle and tendon. There is an additional multiloculated 4.8 x 1.6 x 3.7 cm (AP by transverse by CC) rim enhancing fluid collection along the lateral ankle which also communicates with the tibiotalar joint. IMPRESSION: IMPRESSION 1. Diffuse osteomyelitis involving all of the bones of the the hindfoot and midfoot, as well as the bases of the second through fifth metatarsals, as described above, with chronic fracture through the talar neck. 2. Tibiotalar and subtalar joint septic arthritis with large, loculated abscesses along the medial and lateral ankle, which communicate with the tibiotalar joint. The larger fluid collection along the medial ankle measures up to 6.6 cm. 3. Small amount of fluid in the peroneal tendon sheath and distal peroneus brevis tendon sheath, concerning for infectious tenosynovitis. Likely infectious tenosynovitis of the flexor hallucis longus as well. Electronically Signed   By: Obie Dredge M.D.   On: 03/11/2017 16:49    Lab Data:  CBC:  Recent Labs Lab 03/10/17 1227 03/11/17 0440 03/12/17 0437 03/12/17 1645 03/13/17 0421 03/14/17 0414  WBC 13.8* 10.8* 10.7*  --  10.1 6.6  NEUTROABS 12.1*  --   --   --   --   --   HGB 10.2* 9.4* 9.3* 10.5* 8.8* 7.9*  HCT 33.0* 30.7* 30.8* 31.0* 29.3* 25.6*  MCV 79.3 79.7 80.2  --  79.4 79.0  PLT 355 369 360  --  358 355   Basic Metabolic  Panel:  Recent Labs Lab 03/10/17 1227 03/10/17 1747 03/11/17 0440 03/11/17 1407 03/12/17 0437 03/12/17 1645 03/13/17 0421 03/14/17 0414  NA 132* 133* 135  --  132* 137 131* 137  K 6.6* 5.4* 6.2* 6.1* 6.0* 5.3* 5.0 4.2  CL 103 104 104  --  104  --  100* 107  CO2 20* 20* 23  --  22  --  23 24  GLUCOSE 92 153* 105*  --  166* 121* 344* 126*  BUN 56* 50* 37*  --  27*  --  27* 24*  CREATININE 2.48* 1.94* 1.60*  --  1.18  --  0.98 0.91  CALCIUM 10.6* 10.2 10.2  --  10.0  --  9.3 9.0  MG 2.1  --   --   --   --   --   --   --    GFR: Estimated Creatinine Clearance: 127.3 mL/min (by C-G formula based on SCr of 0.91 mg/dL). Liver Function Tests:  Recent Labs Lab 03/10/17 1227  AST 72*  ALT 69*  ALKPHOS 104  BILITOT 0.7  PROT 8.2*  ALBUMIN 2.7*   No results for input(s): LIPASE, AMYLASE in the last 168 hours. No results for input(s): AMMONIA in the last 168 hours. Coagulation Profile: No results for input(s): INR, PROTIME in the last 168 hours. Cardiac Enzymes: No results for input(s): CKTOTAL, CKMB, CKMBINDEX, TROPONINI in the last 168 hours. BNP (last 3 results) No results for input(s): PROBNP in the last 8760 hours. HbA1C: No results for input(s): HGBA1C in the last 72 hours. CBG:  Recent Labs Lab 03/13/17 0846 03/13/17 1125 03/13/17 1631 03/13/17 2231  03/14/17 0831  GLUCAP 275* 228* 218* 193* 81   Lipid Profile: No results for input(s): CHOL, HDL, LDLCALC, TRIG, CHOLHDL, LDLDIRECT in the last 72 hours. Thyroid Function Tests: No results for input(s): TSH, T4TOTAL, FREET4, T3FREE, THYROIDAB in the last 72 hours. Anemia Panel: No results for input(s): VITAMINB12, FOLATE, FERRITIN, TIBC, IRON, RETICCTPCT in the last 72 hours. Urine analysis:    Component Value Date/Time   COLORURINE GREEN (A) 11/06/2016 1758   APPEARANCEUR CLOUDY (A) 11/06/2016 1758   LABSPEC 1.018 11/06/2016 1758   PHURINE 5.0 11/06/2016 1758   GLUCOSEU NEGATIVE 11/06/2016 1758   HGBUR  SMALL (A) 11/06/2016 1758   BILIRUBINUR NEGATIVE 11/06/2016 1758   KETONESUR NEGATIVE 11/06/2016 1758   PROTEINUR 100 (A) 11/06/2016 1758   NITRITE NEGATIVE 11/06/2016 1758   LEUKOCYTESUR LARGE (A) 11/06/2016 1758     Ripudeep Rai M.D. [facility] 03/14/2017, 10:55 AM  Pager: [phone] Between 7am to 7pm - call Pager - [phone]  After 7pm go to [email or web address] - password [email or web address]  Call night coverage person covering after 7pm

## 2017-03-15 LAB — TYPE AND SCREEN
ABO/RH(D): O POS
Antibody Screen: NEGATIVE
UNIT DIVISION: 0
UNIT DIVISION: 0

## 2017-03-15 LAB — CBC
HEMATOCRIT: 29.4 % — AB (ref 39.0–52.0)
Hemoglobin: 9.4 g/dL — ABNORMAL LOW (ref 13.0–17.0)
MCH: 25.4 pg — ABNORMAL LOW (ref 26.0–34.0)
MCHC: 32 g/dL (ref 30.0–36.0)
MCV: 79.5 fL (ref 78.0–100.0)
Platelets: 349 10*3/uL (ref 150–400)
RBC: 3.7 MIL/uL — ABNORMAL LOW (ref 4.22–5.81)
RDW: 17.4 % — AB (ref 11.5–15.5)
WBC: 6.3 10*3/uL (ref 4.0–10.5)

## 2017-03-15 LAB — BASIC METABOLIC PANEL
Anion gap: 7 (ref 5–15)
BUN: 22 mg/dL — ABNORMAL HIGH (ref 6–20)
CALCIUM: 9 mg/dL (ref 8.9–10.3)
CO2: 25 mmol/L (ref 22–32)
CREATININE: 0.98 mg/dL (ref 0.61–1.24)
Chloride: 103 mmol/L (ref 101–111)
GFR calc Af Amer: >60 mL/min (ref >60)
GFR calc non Af Amer: >60 mL/min (ref >60)
GLUCOSE: 240 mg/dL — AB (ref 65–99)
Potassium: 4.2 mmol/L (ref 3.5–5.1)
Sodium: 135 mmol/L (ref 135–145)

## 2017-03-15 LAB — BPAM RBC
BLOOD PRODUCT EXPIRATION DATE: 201810012359
Blood Product Expiration Date: 201810022359
ISSUE DATE / TIME: 201809011709
ISSUE DATE / TIME: 201809011409
UNIT TYPE AND RH: 5100
Unit Type and Rh: 5100

## 2017-03-15 LAB — CULTURE, BLOOD (ROUTINE X 2)
Culture: NO GROWTH
Culture: NO GROWTH
Special Requests: ADEQUATE
Special Requests: ADEQUATE

## 2017-03-15 MED ORDER — INSULIN ASPART 100 UNIT/ML ~~LOC~~ SOLN
7.0000 [IU] | Freq: Three times a day (TID) | SUBCUTANEOUS | Status: DC
  Administered 2017-03-15 – 2017-03-16 (×2): 7 [IU] via SUBCUTANEOUS

## 2017-03-15 MED ORDER — INSULIN GLARGINE 100 UNIT/ML ~~LOC~~ SOLN
80.0000 [IU] | Freq: Every day | SUBCUTANEOUS | Status: DC
  Administered 2017-03-15 – 2017-03-20 (×6): 80 [IU] via SUBCUTANEOUS
  Filled 2017-03-15 (×7): qty 0.8

## 2017-03-15 NOTE — Progress Notes (Signed)
[facility]                                                                              Patient Demographics  Keith [name], is a 46 y.o. male, DOB - [birth year], ZOX:[id number]  Admit date - 03/10/2017   Admitting Physician Ozella Rocks, MD  Outpatient Primary MD for the patient is [facility]  Outpatient specialists:   LOS - 5  days   Medical records reviewed and are as summarized below:    No chief complaint on file.      Brief summary   Keith [name] is a 46 y.o. male with medical history significant for diabetes mellitus 2 on insulin, obesity, BPH on Flomax, hypertension, dyslipidemia, peripheral neuropathy, and septic arthritis of right ankle. The patient was hospitalized at [facility] May 2018 for septic right ankle. At that time there was no open wound. Podiatrist recommended to discontinue antibiotics given during the hospitalization. He had previously been treated with Bactrim for 2 weeks. Unfortunately the ankle became worse and he developed an open wound. He also underwent intraoperative I&D on 6/14 with cultures being positive for MSSA and he was started on amoxicillin. He was evaluated on 6/29 by ID and changed to IV cefazolin via PICC line with this drug actually initiated on 7/3. He completed 6 weeks of antibiotics on 8/14. Shortly after completing these antibiotics he noticed increased swelling and drainage from the ankle and increasing severe shooting type pains. He followed up with his podiatrist on 8/20 and wound cultures again were positive for MSSA. He was started on Bactrim. Oral ciprofloxacin was added on 8/22. Today he followed up with the ID clinic/Dr. Orvan Falconer complaining of extreme weakness, no appetite, and weight loss of 20 pounds. Clinically he appeared pale and was tachycardic with SBP in the 90s. He was sent directly from the office to the hospital for admission. Of note the ankle wound which had nearly healed from the previous surgery and  antibiotic treatment has now opened and is at least 3 x 4 cm.   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, per patient became significantly worse after finishing IV antibiotics - MRI of the right foot showed diffuse osteomyelitis involving all the bones of the hindfoot and midfoot, basis of second through fifth metatarsals, TB or talar and subtalar joint septic arthritis with large loculated abscess along the medial and lateral ankle which communicate with the tibiotalar joint, 6.6 cm - or I&D done on 8/31 by Dr Logan Bores, high risk for below-knee amputation, recommended long-term IV antibiotics per ID unless patient considers BKA - continue IV cefazolin, cultures growing MSSA staph  - per patient, Dr. Logan Bores is planning on repeat I&D on Tuesday  Active Problems: Anemia, normocytic,postoperative blood loss and possibly hemodilution - hb 7.9 on 9/1 s/p 2 units of packed RBC transfusion - H/H stable, 9.4 today     Type II diabetes mellitus (HCC) now with hyperglycemia - BS elevated, increase Lantus 80 units at bedtime, increase novolog meal coverage to 7 units  TID - continue sliding scale insulin moderate    HTN (hypertension) -BP now stable, continue to hold antihypertensives    Acute kidney injury (HCC)acute hyperkalemia, metabolic acidosis - Secondary to dehydration, hypoperfusion, NSAIDs and ARB - Continue to hold offending medications - hold IV fluids  - creatinine improved to 0.98    Acute hyperkalemia - possibly due to NSAIDS, ARB -resolved  Hyperlipidemia - Continue Lipitor  BPH, no urinary obstruction - continue Flomax  Code Status:  full DVT Prophylaxis:  Lovenox  Family Communication: Discussed in detail with the patient, all imaging results, lab results explained to the patient   Disposition Plan:   Time Spent in minutes   25 minutes  Procedures:  MRI of the right  foot I&D of the right foot and ankle abscess  Consultants:   Infectious disease Podiatry  Antimicrobials:   IV Ancef, 8/28 >   Medications  Scheduled Meds: . aspirin EC  81 mg Oral Daily  . atorvastatin  40 mg Oral q1800  . enoxaparin (LOVENOX) injection  40 mg Subcutaneous Daily  . ferrous sulfate  325 mg Oral TID WC  . gabapentin  1,800 mg Oral TID  . insulin aspart  0-15 Units Subcutaneous TID WC  . insulin aspart  0-5 Units Subcutaneous QHS  . insulin aspart  4 Units Subcutaneous TID WC  . insulin glargine  75 Units Subcutaneous QHS  . PARoxetine  20 mg Oral Daily  . sodium chloride flush  3 mL Intravenous Q12H  . tamsulosin  0.4 mg Oral Daily   Continuous Infusions: . sodium chloride    .  ceFAZolin (ANCEF) IV Stopped (03/15/17 1444)   PRN Meds:.sodium chloride, acetaminophen, HYDROcodone-acetaminophen, morphine injection, morphine injection, ondansetron **OR** ondansetron (ZOFRAN) IV, oxyCODONE-acetaminophen, promethazine, sodium chloride flush, sodium chloride flush   Antibiotics   Anti-infectives    Start     Dose/Rate Route Frequency Ordered Stop   03/10/17 1230  ceFAZolin (ANCEF) IVPB 2g/100 mL premix     2 g 200 mL/hr over 30 Minutes Intravenous Every 8 hours 03/10/17 1158          Subjective:   Keith [name] was seen and examined today. Feeling better, No fevers or chills.  Patient denies dizziness, chest pain, shortness of breath, abdominal pain, N/V/D/C, new weakness, numbess, tingling.   Objective:   Vitals:   03/14/17 1737 03/14/17 2015 03/15/17 0004 03/15/17 0535  BP: (!) 119/58 130/72 119/64 118/66  Pulse: 100 98 92 81  Resp: 18 18 16 16   Temp: 99.2 F (37.3 C) 98.6 F (37 C) 98.4 F (36.9 C) 98.1 F (36.7 C)  TempSrc: Oral Oral Oral Oral  SpO2: 97%  97% 97%  Weight:      Height:        Intake/Output Summary (Last 24 hours) at 03/15/17 1525 Last data filed at 03/15/17 1417  Gross per 24 hour  Intake             1269 ml  Output              1575 ml  Net             -306 ml     Wt Readings from Last 3 Encounters:  03/12/17 105.4 kg (232 lb 6.4 oz)  03/10/17 102.5 kg (226 lb)  02/19/17 109.8 kg (242 lb)     Exam   General: Alert and oriented x 3, NAD Eyes:  HEENT:   Cardiovascular: S1 S2 auscultated, no rubs, murmurs or  gallops. Regular rate and rhythm. No pedal edema b/l Respiratory: Clear to auscultation bilaterally, no wheezing, rales or rhonchi Gastrointestinal: Soft, nontender, nondistended, + bowel sounds Ext: no pedal edema bilaterally Neuro: no neuro deficits  Musculoskeletal: No digital cyanosis, clubbing Skin: right foot dressing intact Psych: Normal affect and demeanor, alert and oriented x3      Data Reviewed:  I have personally reviewed following labs and imaging studies  Micro Results Recent Results (from the past 240 hour(s))  Culture, blood (Routine X 2) w Reflex to ID Panel     Status: None   Collection Time: 03/10/17 12:23 PM  Result Value Ref Range Status   Specimen Description BLOOD RIGHT ANTECUBITAL  Final   Special Requests IN PEDIATRIC BOTTLE Blood Culture adequate volume  Final   Culture NO GROWTH 5 DAYS  Final   Report Status 03/15/2017 FINAL  Final  Culture, blood (Routine X 2) w Reflex to ID Panel     Status: None   Collection Time: 03/10/17 12:31 PM  Result Value Ref Range Status   Specimen Description BLOOD RIGHT ANTECUBITAL  Final   Special Requests IN PEDIATRIC BOTTLE Blood Culture adequate volume  Final   Culture NO GROWTH 5 DAYS  Final   Report Status 03/15/2017 FINAL  Final  Surgical pcr screen     Status: None   Collection Time: 03/11/17 10:02 PM  Result Value Ref Range Status   MRSA, PCR NEGATIVE NEGATIVE Final   Staphylococcus aureus NEGATIVE NEGATIVE Final    Comment: (NOTE) The Xpert SA Assay (FDA approved for NASAL specimens in patients 70 years of age and older), is one component of a comprehensive surveillance program. It is not intended to diagnose  infection nor to guide or monitor treatment.   Aerobic/Anaerobic Culture (surgical/deep wound)     Status: None (Preliminary result)   Collection Time: 03/12/17  5:25 PM  Result Value Ref Range Status   Specimen Description ABSCESS RIGHT ANKLE  Final   Special Requests NONE  Final   Gram Stain   Final    MODERATE WBC PRESENT,BOTH PMN AND MONONUCLEAR NO ORGANISMS SEEN    Culture   Final    RARE STAPHYLOCOCCUS AUREUS NO ANAEROBES ISOLATED; CULTURE IN PROGRESS FOR 5 DAYS    Report Status PENDING  Incomplete   Organism ID, Bacteria STAPHYLOCOCCUS AUREUS  Final      Susceptibility   Staphylococcus aureus - MIC*    CIPROFLOXACIN <=0.5 SENSITIVE Sensitive     ERYTHROMYCIN <=0.25 SENSITIVE Sensitive     GENTAMICIN <=0.5 SENSITIVE Sensitive     OXACILLIN 0.5 SENSITIVE Sensitive     TETRACYCLINE <=1 SENSITIVE Sensitive     VANCOMYCIN <=0.5 SENSITIVE Sensitive     TRIMETH/SULFA <=10 SENSITIVE Sensitive     CLINDAMYCIN <=0.25 SENSITIVE Sensitive     RIFAMPIN <=0.5 SENSITIVE Sensitive     Inducible Clindamycin NEGATIVE Sensitive     * RARE STAPHYLOCOCCUS AUREUS    Radiology Reports Mr Ankle Right W Wo Contrast  Result Date: 03/11/2017 CLINICAL DATA:  Right ankle osteomyelitis. EXAM: MRI OF THE RIGHT ANKLE WITHOUT AND WITH CONTRAST TECHNIQUE: Multiplanar, multisequence MR imaging of the ankle was performed before and after the administration of intravenous contrast. CONTRAST:  20mL MULTIHANCE GADOBENATE DIMEGLUMINE 529 MG/ML IV SOLN COMPARISON:  Right ankle x-rays dated December 12, 2016. Right foot MRI dated Nov 27, 2016. FINDINGS: TENDONS Peroneal: Mildly thickened. Small amount of fluid in the tendon sheath at the retromalleolar groove. Additional small amount of fluid in  the peroneus brevis tendon sheath near its insertion on the base of fifth metatarsal. Posteromedial: Grossly intact. Anterior: Intact. Achilles: Intact. Plantar Fascia: Mild thickening of the plantar fascia. LIGAMENTS Lateral:  The anterior talofibular and calcaneofibular ligaments are attenuated and likely chronically torn. Medial: Grossly intact. CARTILAGE Ankle Joint: There is a large ankle joint effusion with enhancing synovium. Subtalar Joints/Sinus Tarsi: Moderate subtalar joint effusion with enhancing synovium. Abnormal signal within the sinus tarsi. Bones: There is prominent, enhancing marrow edema with corresponding T1 hypointensity and cortical destruction involving the distal tibia, distal fibula, talus, calcaneus, cuboid, navicular, all 3 cuneiforms, and the bases of the second through fifth metatarsals. There is a chronic fracture through the talar neck. Other: There is an open soft tissue defect along the medial ankle. There is a large multiloculated, rim enhancing fluid collection along the medial ankle, measuring approximately 6.2 x 2.4 x 6.6 cm (AP by transverse by CC) . This fluid collection likely communicates with the ankle joint and courses around the flexor hallucis longus muscle and tendon. There is an additional multiloculated 4.8 x 1.6 x 3.7 cm (AP by transverse by CC) rim enhancing fluid collection along the lateral ankle which also communicates with the tibiotalar joint. IMPRESSION: IMPRESSION 1. Diffuse osteomyelitis involving all of the bones of the the hindfoot and midfoot, as well as the bases of the second through fifth metatarsals, as described above, with chronic fracture through the talar neck. 2. Tibiotalar and subtalar joint septic arthritis with large, loculated abscesses along the medial and lateral ankle, which communicate with the tibiotalar joint. The larger fluid collection along the medial ankle measures up to 6.6 cm. 3. Small amount of fluid in the peroneal tendon sheath and distal peroneus brevis tendon sheath, concerning for infectious tenosynovitis. Likely infectious tenosynovitis of the flexor hallucis longus as well. Electronically Signed   By: Obie Dredge M.D.   On: 03/11/2017 16:49     Lab Data:  CBC:  Recent Labs Lab 03/10/17 1227 03/11/17 0440 03/12/17 7829 03/12/17 1645 03/13/17 0421 03/14/17 0414 03/14/17 2158 03/15/17 0350  WBC 13.8* 10.8* 10.7*  --  10.1 6.6  --  6.3  NEUTROABS 12.1*  --   --   --   --   --   --   --   HGB 10.2* 9.4* 9.3* 10.5* 8.8* 7.9* 9.6* 9.4*  HCT 33.0* 30.7* 30.8* 31.0* 29.3* 25.6* 30.4* 29.4*  MCV 79.3 79.7 80.2  --  79.4 79.0  --  79.5  PLT 355 369 360  --  358 355  --  349   Basic Metabolic Panel:  Recent Labs Lab 03/10/17 1227  03/11/17 0440  03/12/17 0437 03/12/17 1645 03/13/17 0421 03/14/17 0414 03/15/17 0350  NA 132*  < > 135  --  132* 137 131* 137 135  K 6.6*  < > 6.2*  < > 6.0* 5.3* 5.0 4.2 4.2  CL 103  < > 104  --  104  --  100* 107 103  CO2 20*  < > 23  --  22  --  23 24 25   GLUCOSE 92  < > 105*  --  166* 121* 344* 126* 240*  BUN 56*  < > 37*  --  27*  --  27* 24* 22*  CREATININE 2.48*  < > 1.60*  --  1.18  --  0.98 0.91 0.98  CALCIUM 10.6*  < > 10.2  --  10.0  --  9.3 9.0 9.0  MG 2.1  --   --   --   --   --   --   --   --   < > =  values in this interval not displayed. GFR: Estimated Creatinine Clearance: 118.2 mL/min (by C-G formula based on SCr of 0.98 mg/dL). Liver Function Tests:  Recent Labs Lab 03/10/17 1227  AST 72*  ALT 69*  ALKPHOS 104  BILITOT 0.7  PROT 8.2*  ALBUMIN 2.7*   No results for input(s): LIPASE, AMYLASE in the last 168 hours. No results for input(s): AMMONIA in the last 168 hours. Coagulation Profile: No results for input(s): INR, PROTIME in the last 168 hours. Cardiac Enzymes: No results for input(s): CKTOTAL, CKMB, CKMBINDEX, TROPONINI in the last 168 hours. BNP (last 3 results) No results for input(s): PROBNP in the last 8760 hours. HbA1C: No results for input(s): HGBA1C in the last 72 hours. CBG:  Recent Labs Lab 03/13/17 2231 03/14/17 0831 03/14/17 1246 03/14/17 1642 03/14/17 2232  GLUCAP 193* 81 164* 148* 249*   Lipid Profile: No results for  input(s): CHOL, HDL, LDLCALC, TRIG, CHOLHDL, LDLDIRECT in the last 72 hours. Thyroid Function Tests: No results for input(s): TSH, T4TOTAL, FREET4, T3FREE, THYROIDAB in the last 72 hours. Anemia Panel: No results for input(s): VITAMINB12, FOLATE, FERRITIN, TIBC, IRON, RETICCTPCT in the last 72 hours. Urine analysis:    Component Value Date/Time   COLORURINE GREEN (A) 11/06/2016 1758   APPEARANCEUR CLOUDY (A) 11/06/2016 1758   LABSPEC 1.018 11/06/2016 1758   PHURINE 5.0 11/06/2016 1758   GLUCOSEU NEGATIVE 11/06/2016 1758   HGBUR SMALL (A) 11/06/2016 1758   BILIRUBINUR NEGATIVE 11/06/2016 1758   KETONESUR NEGATIVE 11/06/2016 1758   PROTEINUR 100 (A) 11/06/2016 1758   NITRITE NEGATIVE 11/06/2016 1758   LEUKOCYTESUR LARGE (A) 11/06/2016 1758     Ripudeep Rai M.D. [facility] 03/15/2017, 3:25 PM  Pager: [phone] Between 7am to 7pm - call Pager - [phone]  After 7pm go to [email or web address] - password [email or web address]  Call night coverage person covering after 7pm

## 2017-03-16 ENCOUNTER — Encounter (HOSPITAL_COMMUNITY): Payer: Self-pay | Admitting: Podiatry

## 2017-03-16 LAB — CBC
HEMATOCRIT: 31 % — AB (ref 39.0–52.0)
HEMOGLOBIN: 9.6 g/dL — AB (ref 13.0–17.0)
MCH: 25 pg — AB (ref 26.0–34.0)
MCHC: 31 g/dL (ref 30.0–36.0)
MCV: 80.7 fL (ref 78.0–100.0)
Platelets: 367 10*3/uL (ref 150–400)
RBC: 3.84 MIL/uL — ABNORMAL LOW (ref 4.22–5.81)
RDW: 17 % — ABNORMAL HIGH (ref 11.5–15.5)
WBC: 6.6 10*3/uL (ref 4.0–10.5)

## 2017-03-16 LAB — BASIC METABOLIC PANEL
Anion gap: 6 (ref 5–15)
BUN: 19 mg/dL (ref 6–20)
CHLORIDE: 102 mmol/L (ref 101–111)
CO2: 25 mmol/L (ref 22–32)
CREATININE: 0.85 mg/dL (ref 0.61–1.24)
Calcium: 9.3 mg/dL (ref 8.9–10.3)
GFR calc Af Amer: >60 mL/min (ref >60)
GFR calc non Af Amer: >60 mL/min (ref >60)
GLUCOSE: 241 mg/dL — AB (ref 65–99)
Potassium: 4.4 mmol/L (ref 3.5–5.1)
Sodium: 133 mmol/L — ABNORMAL LOW (ref 135–145)

## 2017-03-16 LAB — GLUCOSE, CAPILLARY
GLUCOSE-CAPILLARY: 186 mg/dL — AB (ref 65–99)
GLUCOSE-CAPILLARY: 273 mg/dL — AB (ref 65–99)
Glucose-Capillary: 147 mg/dL — ABNORMAL HIGH (ref 65–99)

## 2017-03-16 MED ORDER — INSULIN ASPART 100 UNIT/ML ~~LOC~~ SOLN
10.0000 [IU] | Freq: Three times a day (TID) | SUBCUTANEOUS | Status: DC
  Administered 2017-03-16 – 2017-03-21 (×10): 10 [IU] via SUBCUTANEOUS

## 2017-03-16 NOTE — Progress Notes (Signed)
[facility]                                                                              Patient Demographics  Keith [name], is a 46 y.o. male, DOB - May 09, 1971, RUE:[id number]  Admit date - 03/10/2017   Admitting Physician Ozella Rocks, MD  Outpatient Primary MD for the patient is [facility]  Outpatient specialists:   LOS - 6  days   Medical records reviewed and are as summarized below:    No chief complaint on file.      Brief summary   Keith [name] is a 46 y.o. male with medical history significant for diabetes mellitus 2 on insulin, obesity, BPH on Flomax, hypertension, dyslipidemia, peripheral neuropathy, and septic arthritis of right ankle. The patient was hospitalized at [facility] May 2018 for septic right ankle. At that time there was no open wound. Podiatrist recommended to discontinue antibiotics given during the hospitalization. He had previously been treated with Bactrim for 2 weeks. Unfortunately the ankle became worse and he developed an open wound. He also underwent intraoperative I&D on 6/14 with cultures being positive for MSSA and he was started on amoxicillin. He was evaluated on 6/29 by ID and changed to IV cefazolin via PICC line with this drug actually initiated on 7/3. He completed 6 weeks of antibiotics on 8/14. Shortly after completing these antibiotics he noticed increased swelling and drainage from the ankle and increasing severe shooting type pains. He followed up with his podiatrist on 8/20 and wound cultures again were positive for MSSA. He was started on Bactrim. Oral ciprofloxacin was added on 8/22. Today he followed up with the ID clinic/Dr. Orvan Falconer complaining of extreme weakness, no appetite, and weight loss of 20 pounds. Clinically he appeared pale and was tachycardic with SBP in the 90s. He was sent directly from the office to the hospital for admission. Of note the ankle wound which had nearly healed from the previous surgery and  antibiotic treatment has now opened and is at least 3 x 4 cm.   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, per patient became significantly worse after finishing IV antibiotics - MRI of the right foot showed diffuse osteomyelitis involving all the bones of the hindfoot and midfoot, basis of second through fifth metatarsals, TB or talar and subtalar joint septic arthritis with large loculated abscess along the medial and lateral ankle which communicate with the tibiotalar joint, 6.6 cm - or I&D done on 8/31 by Dr Logan Bores, high risk for below-knee amputation, recommended long-term IV antibiotics per ID unless patient considers BKA - Cultures growing MSSA, continue IV cefazolin - per patient, Dr. Logan Bores is planning on repeat I&D on Tuesday  Active Problems: Anemia, normocytic,postoperative blood loss and possibly hemodilution -hemoglobin 9.6 after 2 units packed RBC transfusion of 9/1    Type II diabetes mellitus (HCC) now with hyperglycemia - CBG is still elevated, continue Lantus 80 units at bedtime, NovoLog meal coverage increased to 10 units 3 times a day, Continue moderate sliding scale  insulin     HTN (hypertension) -BP now stable, continue to hold antihypertensives    Acute kidney injury (HCC)acute hyperkalemia, metabolic acidosis - Secondary to dehydration, hypoperfusion, NSAIDs and ARB - Continue to hold offending medications - creatinine improved to 0.8, off IV fluids    Acute hyperkalemia - possibly due to NSAIDS, ARB - resolved    Anemia, chronic disease - currently stable, continue close monitoring, baseline hemoglobin 8.3 - h&H currently stable, 9.6 today  Hyperlipidemia - Continue Lipitor  BPH, no urinary obstruction - continue Flomax  Code Status:  full DVT Prophylaxis:  Lovenox  Family Communication: Discussed in detail with the patient, all imaging results, lab  results explained to the patient   Disposition Plan:   Time Spent in minutes   25 minutes  Procedures:  MRI of the right foot I&D of the right foot and ankle abscess  Consultants:   Infectious disease Podiatry  Antimicrobials:   IV Ancef, 8/28 >   Medications  Scheduled Meds: . aspirin EC  81 mg Oral Daily  . atorvastatin  40 mg Oral q1800  . enoxaparin (LOVENOX) injection  40 mg Subcutaneous Daily  . ferrous sulfate  325 mg Oral TID WC  . gabapentin  1,800 mg Oral TID  . insulin aspart  0-15 Units Subcutaneous TID WC  . insulin aspart  0-5 Units Subcutaneous QHS  . insulin aspart  7 Units Subcutaneous TID WC  . insulin glargine  80 Units Subcutaneous QHS  . PARoxetine  20 mg Oral Daily  . sodium chloride flush  3 mL Intravenous Q12H  . tamsulosin  0.4 mg Oral Daily   Continuous Infusions: . sodium chloride    .  ceFAZolin (ANCEF) IV Stopped (03/16/17 0711)   PRN Meds:.sodium chloride, acetaminophen, HYDROcodone-acetaminophen, morphine injection, morphine injection, ondansetron **OR** ondansetron (ZOFRAN) IV, oxyCODONE-acetaminophen, promethazine, sodium chloride flush, sodium chloride flush   Antibiotics   Anti-infectives    Start     Dose/Rate Route Frequency Ordered Stop   03/10/17 1230  ceFAZolin (ANCEF) IVPB 2g/100 mL premix     2 g 200 mL/hr over 30 Minutes Intravenous Every 8 hours 03/10/17 1158          Subjective:   Keith [name] was seen and examined today. Getting anxious and depressed, does not want to lose his foot. No fevers or chills.  Patient denies dizziness, chest pain, shortness of breath, abdominal pain, N/V/D/C, new weakness, numbess, tingling.   Objective:   Vitals:   03/15/17 0535 03/15/17 1657 03/15/17 2114 03/16/17 0430  BP: 118/66 119/65 124/74 132/78  Pulse: 81 96 86 92  Resp: 16 17 16 16   Temp: 98.1 F (36.7 C) 98.2 F (36.8 C) 98.5 F (36.9 C) 98.6 F (37 C)  TempSrc: Oral Oral Oral Oral  SpO2: 97% 98% 95% 97%    Weight:      Height:        Intake/Output Summary (Last 24 hours) at 03/16/17 1159 Last data filed at 03/16/17 0646  Gross per 24 hour  Intake              900 ml  Output             1350 ml  Net             -450 ml     Wt Readings from Last 3 Encounters:  03/12/17 105.4 kg (232 lb 6.4 oz)  03/10/17 102.5 kg (226 lb)  02/19/17 109.8 kg (242 lb)  Exam General: Alert and oriented x 3, NAD Eyes: P HEENT:   Cardiovascular: S1 S2 auscultated, no rubs, murmurs or gallops. Regular rate and rhythm. No pedal edema b/l Respiratory: Clear to auscultation bilaterally, no wheezing, rales or rhonchi Gastrointestinal: Soft, nontender, nondistended, + bowel sounds Ext: no pedal edema bilaterally Neuro:no new deficits Musculoskeletal: No digital cyanosis, clubbing,  Skin: right foot dressing intact Psych: Normal affect and demeanor, alert and oriented x3      Data Reviewed:  I have personally reviewed following labs and imaging studies  Micro Results Recent Results (from the past 240 hour(s))  Culture, blood (Routine X 2) w Reflex to ID Panel     Status: None   Collection Time: 03/10/17 12:23 PM  Result Value Ref Range Status   Specimen Description BLOOD RIGHT ANTECUBITAL  Final   Special Requests IN PEDIATRIC BOTTLE Blood Culture adequate volume  Final   Culture NO GROWTH 5 DAYS  Final   Report Status 03/15/2017 FINAL  Final  Culture, blood (Routine X 2) w Reflex to ID Panel     Status: None   Collection Time: 03/10/17 12:31 PM  Result Value Ref Range Status   Specimen Description BLOOD RIGHT ANTECUBITAL  Final   Special Requests IN PEDIATRIC BOTTLE Blood Culture adequate volume  Final   Culture NO GROWTH 5 DAYS  Final   Report Status 03/15/2017 FINAL  Final  Surgical pcr screen     Status: None   Collection Time: 03/11/17 10:02 PM  Result Value Ref Range Status   MRSA, PCR NEGATIVE NEGATIVE Final   Staphylococcus aureus NEGATIVE NEGATIVE Final    Comment: (NOTE) The  Xpert SA Assay (FDA approved for NASAL specimens in patients 15 years of age and older), is one component of a comprehensive surveillance program. It is not intended to diagnose infection nor to guide or monitor treatment.   Aerobic/Anaerobic Culture (surgical/deep wound)     Status: None (Preliminary result)   Collection Time: 03/12/17  5:25 PM  Result Value Ref Range Status   Specimen Description ABSCESS RIGHT ANKLE  Final   Special Requests NONE  Final   Gram Stain   Final    MODERATE WBC PRESENT,BOTH PMN AND MONONUCLEAR NO ORGANISMS SEEN    Culture   Final    RARE STAPHYLOCOCCUS AUREUS NO ANAEROBES ISOLATED; CULTURE IN PROGRESS FOR 5 DAYS    Report Status PENDING  Incomplete   Organism ID, Bacteria STAPHYLOCOCCUS AUREUS  Final      Susceptibility   Staphylococcus aureus - MIC*    CIPROFLOXACIN <=0.5 SENSITIVE Sensitive     ERYTHROMYCIN <=0.25 SENSITIVE Sensitive     GENTAMICIN <=0.5 SENSITIVE Sensitive     OXACILLIN 0.5 SENSITIVE Sensitive     TETRACYCLINE <=1 SENSITIVE Sensitive     VANCOMYCIN <=0.5 SENSITIVE Sensitive     TRIMETH/SULFA <=10 SENSITIVE Sensitive     CLINDAMYCIN <=0.25 SENSITIVE Sensitive     RIFAMPIN <=0.5 SENSITIVE Sensitive     Inducible Clindamycin NEGATIVE Sensitive     * RARE STAPHYLOCOCCUS AUREUS    Radiology Reports Mr Ankle Right W Wo Contrast  Result Date: 03/11/2017 CLINICAL DATA:  Right ankle osteomyelitis. EXAM: MRI OF THE RIGHT ANKLE WITHOUT AND WITH CONTRAST TECHNIQUE: Multiplanar, multisequence MR imaging of the ankle was performed before and after the administration of intravenous contrast. CONTRAST:  20mL MULTIHANCE GADOBENATE DIMEGLUMINE 529 MG/ML IV SOLN COMPARISON:  Right ankle x-rays dated December 12, 2016. Right foot MRI dated Nov 27, 2016. FINDINGS: TENDONS Peroneal:  Mildly thickened. Small amount of fluid in the tendon sheath at the retromalleolar groove. Additional small amount of fluid in the peroneus brevis tendon sheath near its  insertion on the base of fifth metatarsal. Posteromedial: Grossly intact. Anterior: Intact. Achilles: Intact. Plantar Fascia: Mild thickening of the plantar fascia. LIGAMENTS Lateral: The anterior talofibular and calcaneofibular ligaments are attenuated and likely chronically torn. Medial: Grossly intact. CARTILAGE Ankle Joint: There is a large ankle joint effusion with enhancing synovium. Subtalar Joints/Sinus Tarsi: Moderate subtalar joint effusion with enhancing synovium. Abnormal signal within the sinus tarsi. Bones: There is prominent, enhancing marrow edema with corresponding T1 hypointensity and cortical destruction involving the distal tibia, distal fibula, talus, calcaneus, cuboid, navicular, all 3 cuneiforms, and the bases of the second through fifth metatarsals. There is a chronic fracture through the talar neck. Other: There is an open soft tissue defect along the medial ankle. There is a large multiloculated, rim enhancing fluid collection along the medial ankle, measuring approximately 6.2 x 2.4 x 6.6 cm (AP by transverse by CC) . This fluid collection likely communicates with the ankle joint and courses around the flexor hallucis longus muscle and tendon. There is an additional multiloculated 4.8 x 1.6 x 3.7 cm (AP by transverse by CC) rim enhancing fluid collection along the lateral ankle which also communicates with the tibiotalar joint. IMPRESSION: IMPRESSION 1. Diffuse osteomyelitis involving all of the bones of the the hindfoot and midfoot, as well as the bases of the second through fifth metatarsals, as described above, with chronic fracture through the talar neck. 2. Tibiotalar and subtalar joint septic arthritis with large, loculated abscesses along the medial and lateral ankle, which communicate with the tibiotalar joint. The larger fluid collection along the medial ankle measures up to 6.6 cm. 3. Small amount of fluid in the peroneal tendon sheath and distal peroneus brevis tendon sheath,  concerning for infectious tenosynovitis. Likely infectious tenosynovitis of the flexor hallucis longus as well. Electronically Signed   By: Obie Dredge M.D.   On: 03/11/2017 16:49    Lab Data:  CBC:  Recent Labs Lab 03/10/17 1227  03/12/17 0437  03/13/17 0421 03/14/17 0414 03/14/17 2158 03/15/17 0350 03/16/17 0349  WBC 13.8*  < > 10.7*  --  10.1 6.6  --  6.3 6.6  NEUTROABS 12.1*  --   --   --   --   --   --   --   --   HGB 10.2*  < > 9.3*  < > 8.8* 7.9* 9.6* 9.4* 9.6*  HCT 33.0*  < > 30.8*  < > 29.3* 25.6* 30.4* 29.4* 31.0*  MCV 79.3  < > 80.2  --  79.4 79.0  --  79.5 80.7  PLT 355  < > 360  --  358 355  --  349 367  < > = values in this interval not displayed. Basic Metabolic Panel:  Recent Labs Lab 03/10/17 1227  03/12/17 0437 03/12/17 1645 03/13/17 0421 03/14/17 0414 03/15/17 0350 03/16/17 0349  NA 132*  < > 132* 137 131* 137 135 133*  K 6.6*  < > 6.0* 5.3* 5.0 4.2 4.2 4.4  CL 103  < > 104  --  100* 107 103 102  CO2 20*  < > 22  --  23 24 25 25   GLUCOSE 92  < > 166* 121* 344* 126* 240* 241*  BUN 56*  < > 27*  --  27* 24* 22* 19  CREATININE 2.48*  < > 1.18  --  0.98 0.91 0.98 0.85  CALCIUM 10.6*  < > 10.0  --  9.3 9.0 9.0 9.3  MG 2.1  --   --   --   --   --   --   --   < > = values in this interval not displayed. GFR: Estimated Creatinine Clearance: 136.2 mL/min (by C-G formula based on SCr of 0.85 mg/dL). Liver Function Tests:  Recent Labs Lab 03/10/17 1227  AST 72*  ALT 69*  ALKPHOS 104  BILITOT 0.7  PROT 8.2*  ALBUMIN 2.7*   No results for input(s): LIPASE, AMYLASE in the last 168 hours. No results for input(s): AMMONIA in the last 168 hours. Coagulation Profile: No results for input(s): INR, PROTIME in the last 168 hours. Cardiac Enzymes: No results for input(s): CKTOTAL, CKMB, CKMBINDEX, TROPONINI in the last 168 hours. BNP (last 3 results) No results for input(s): PROBNP in the last 8760 hours. HbA1C: No results for input(s): HGBA1C in  the last 72 hours. CBG:  Recent Labs Lab 03/14/17 0831 03/14/17 1246 03/14/17 1642 03/14/17 2232 03/16/17 1144  GLUCAP 81 164* 148* 249* 186*   Lipid Profile: No results for input(s): CHOL, HDL, LDLCALC, TRIG, CHOLHDL, LDLDIRECT in the last 72 hours. Thyroid Function Tests: No results for input(s): TSH, T4TOTAL, FREET4, T3FREE, THYROIDAB in the last 72 hours. Anemia Panel: No results for input(s): VITAMINB12, FOLATE, FERRITIN, TIBC, IRON, RETICCTPCT in the last 72 hours. Urine analysis:    Component Value Date/Time   COLORURINE GREEN (A) 11/06/2016 1758   APPEARANCEUR CLOUDY (A) 11/06/2016 1758   LABSPEC 1.018 11/06/2016 1758   PHURINE 5.0 11/06/2016 1758   GLUCOSEU NEGATIVE 11/06/2016 1758   HGBUR SMALL (A) 11/06/2016 1758   BILIRUBINUR NEGATIVE 11/06/2016 1758   KETONESUR NEGATIVE 11/06/2016 1758   PROTEINUR 100 (A) 11/06/2016 1758   NITRITE NEGATIVE 11/06/2016 1758   LEUKOCYTESUR LARGE (A) 11/06/2016 1758     Ripudeep Rai M.D. [facility] 03/16/2017, 11:59 AM  Pager: [phone] Between 7am to 7pm - call Pager - [phone]  After 7pm go to [email or web address] - password [email or web address]  Call night coverage person covering after 7pm

## 2017-03-16 NOTE — Progress Notes (Signed)
[facility]    Date of Admission:  03/10/2017   Total days of antibiotics 6 Cefazolin                Reason for visit: Follow up on osteomyelitis  Assessment: Right ankle infection with septic arthritis, osteomyelitis and large medial abscess.  S/P I&D Logan Bores)  Plan: 1. Continue Ancef - 8 weeks duration of therapy after last washout to ensure adequate source control 2. Await further plans for further debridements 3. I spent a lot of time talking to him about plans and confidence in organism based on our culture data.   Rexene Alberts, MSN, NP-C [facility] Pager: [phone]  03/16/2017 12:21 PM      Interval History: Very frustrated with lack of plan for clean out as it is not posted yet. Not confident that the "same antibiotics as before will cure this now." Afebrile. Tolerating antibiotics well. PICC unremarkable    Principal Problem:   Osteomyelitis of ankle, right, acute (HCC) Active Problems:   Type II diabetes mellitus (HCC)   HTN (hypertension)   Hypertension   Acute kidney injury (HCC)   Acute hyperkalemia   Anemia, chronic disease   . aspirin EC  81 mg Oral Daily  . atorvastatin  40 mg Oral q1800  . enoxaparin (LOVENOX) injection  40 mg Subcutaneous Daily  . ferrous sulfate  325 mg Oral TID WC  . gabapentin  1,800 mg Oral TID  . insulin aspart  0-15 Units Subcutaneous TID WC  . insulin aspart  0-5 Units Subcutaneous QHS  . insulin aspart  7 Units Subcutaneous TID WC  . insulin glargine  80 Units Subcutaneous QHS  . PARoxetine  20 mg Oral Daily  . sodium chloride flush  3 mL Intravenous Q12H  . tamsulosin  0.4 mg Oral Daily    Review of Systems: Review of Systems  Constitutional: Negative for chills and fever.  Respiratory: Negative.  Negative for cough.   Cardiovascular: Negative.   Gastrointestinal: Negative for diarrhea, nausea and vomiting.    Genitourinary: Negative for dysuria.  Skin: Negative for rash.    Past Medical History:  Diagnosis Date  . Diabetic peripheral neuropathy (HCC)   . Hyperlipidemia   . Hypertension   . Type I diabetes mellitus (HCC)     Allergies  Allergen Reactions  . Daptomycin Anaphylaxis and Shortness Of Breath    Breathing issues  . Lisinopril Other (See Comments)    Cough    OBJECTIVE: Blood pressure 132/78, pulse 92, temperature 98.6 F (37 C), temperature source Oral, resp. rate 16, height 6' (1.829 m), weight 232 lb 6.4 oz (105.4 kg), SpO2 97 %.  Physical Exam  Constitutional: He is oriented to person, place, and time and well-developed, well-nourished, and in no distress.  Lying in bed. Very frustrated with plan of possible surgery and same antibiotics as before.   Cardiovascular: Normal rate, regular rhythm and intact distal pulses.   Pulmonary/Chest: Effort normal and breath sounds normal. No respiratory distress.  Abdominal: Soft. Bowel sounds are normal.  Neurological: He is alert and oriented to person, place, and time.  Skin: Skin is warm and dry.  Psychiatric: Affect normal.    Lab Results Lab Results  Component Value Date   WBC 6.6 03/16/2017   HGB 9.6 (L) 03/16/2017   HCT 31.0 (L) 03/16/2017   MCV 80.7 03/16/2017   PLT 367 03/16/2017  Lab Results  Component Value Date   CREATININE 0.85 03/16/2017   BUN 19 03/16/2017   NA 133 (L) 03/16/2017   K 4.4 03/16/2017   CL 102 03/16/2017   CO2 25 03/16/2017    Lab Results  Component Value Date   ALT 69 (H) 03/10/2017   AST 72 (H) 03/10/2017   ALKPHOS 104 03/10/2017   BILITOT 0.7 03/10/2017     Microbiology: Recent Results (from the past 240 hour(s))  Culture, blood (Routine X 2) w Reflex to ID Panel     Status: None   Collection Time: 03/10/17 12:23 PM  Result Value Ref Range Status   Specimen Description BLOOD RIGHT ANTECUBITAL  Final   Special Requests IN PEDIATRIC BOTTLE Blood Culture adequate volume   Final   Culture NO GROWTH 5 DAYS  Final   Report Status 03/15/2017 FINAL  Final  Culture, blood (Routine X 2) w Reflex to ID Panel     Status: None   Collection Time: 03/10/17 12:31 PM  Result Value Ref Range Status   Specimen Description BLOOD RIGHT ANTECUBITAL  Final   Special Requests IN PEDIATRIC BOTTLE Blood Culture adequate volume  Final   Culture NO GROWTH 5 DAYS  Final   Report Status 03/15/2017 FINAL  Final  Surgical pcr screen     Status: None   Collection Time: 03/11/17 10:02 PM  Result Value Ref Range Status   MRSA, PCR NEGATIVE NEGATIVE Final   Staphylococcus aureus NEGATIVE NEGATIVE Final    Comment: (NOTE) The Xpert SA Assay (FDA approved for NASAL specimens in patients 322 years of age and older), is one component of a comprehensive surveillance program. It is not intended to diagnose infection nor to guide or monitor treatment.   Aerobic/Anaerobic Culture (surgical/deep wound)     Status: None (Preliminary result)   Collection Time: 03/12/17  5:25 PM  Result Value Ref Range Status   Specimen Description ABSCESS RIGHT ANKLE  Final   Special Requests NONE  Final   Gram Stain   Final    MODERATE WBC PRESENT,BOTH PMN AND MONONUCLEAR NO ORGANISMS SEEN    Culture   Final    RARE STAPHYLOCOCCUS AUREUS NO ANAEROBES ISOLATED; CULTURE IN PROGRESS FOR 5 DAYS    Report Status PENDING  Incomplete   Organism ID, Bacteria STAPHYLOCOCCUS AUREUS  Final      Susceptibility   Staphylococcus aureus - MIC*    CIPROFLOXACIN <=0.5 SENSITIVE Sensitive     ERYTHROMYCIN <=0.25 SENSITIVE Sensitive     GENTAMICIN <=0.5 SENSITIVE Sensitive     OXACILLIN 0.5 SENSITIVE Sensitive     TETRACYCLINE <=1 SENSITIVE Sensitive     VANCOMYCIN <=0.5 SENSITIVE Sensitive     TRIMETH/SULFA <=10 SENSITIVE Sensitive     CLINDAMYCIN <=0.25 SENSITIVE Sensitive     RIFAMPIN <=0.5 SENSITIVE Sensitive     Inducible Clindamycin NEGATIVE Sensitive     * RARE STAPHYLOCOCCUS AUREUS    Rexene AlbertsStephanie Percilla Tweten,  MSN, NP-C [facility] Cell: [phone] Pager: [phone]  03/16/2017 11:43 AM

## 2017-03-17 LAB — BASIC METABOLIC PANEL
ANION GAP: 7 (ref 5–15)
BUN: 23 mg/dL — ABNORMAL HIGH (ref 6–20)
CALCIUM: 9.5 mg/dL (ref 8.9–10.3)
CO2: 26 mmol/L (ref 22–32)
Chloride: 101 mmol/L (ref 101–111)
Creatinine, Ser: 0.84 mg/dL (ref 0.61–1.24)
GLUCOSE: 152 mg/dL — AB (ref 65–99)
Potassium: 4.2 mmol/L (ref 3.5–5.1)
SODIUM: 134 mmol/L — AB (ref 135–145)

## 2017-03-17 LAB — GLUCOSE, CAPILLARY
GLUCOSE-CAPILLARY: 258 mg/dL — AB (ref 65–99)
GLUCOSE-CAPILLARY: 191 mg/dL — AB (ref 65–99)
GLUCOSE-CAPILLARY: 194 mg/dL — AB (ref 65–99)
Glucose-Capillary: 198 mg/dL — ABNORMAL HIGH (ref 65–99)
Glucose-Capillary: 177 mg/dL — ABNORMAL HIGH (ref 65–99)
Glucose-Capillary: 173 mg/dL — ABNORMAL HIGH (ref 65–99)
Glucose-Capillary: 202 mg/dL — ABNORMAL HIGH (ref 65–99)
Glucose-Capillary: 133 mg/dL — ABNORMAL HIGH (ref 65–99)
Glucose-Capillary: 190 mg/dL — ABNORMAL HIGH (ref 65–99)

## 2017-03-17 LAB — AEROBIC/ANAEROBIC CULTURE W GRAM STAIN (SURGICAL/DEEP WOUND)

## 2017-03-17 LAB — CBC
HCT: 31.6 % — ABNORMAL LOW (ref 39.0–52.0)
Hemoglobin: 9.7 g/dL — ABNORMAL LOW (ref 13.0–17.0)
MCH: 24.8 pg — ABNORMAL LOW (ref 26.0–34.0)
MCHC: 30.7 g/dL (ref 30.0–36.0)
MCV: 80.8 fL (ref 78.0–100.0)
PLATELETS: 402 10*3/uL — AB (ref 150–400)
RBC: 3.91 MIL/uL — ABNORMAL LOW (ref 4.22–5.81)
RDW: 17 % — AB (ref 11.5–15.5)
WBC: 7.7 10*3/uL (ref 4.0–10.5)

## 2017-03-17 LAB — AEROBIC/ANAEROBIC CULTURE (SURGICAL/DEEP WOUND)

## 2017-03-17 MED ORDER — POVIDONE-IODINE 10 % EX SOLN
Freq: Once | CUTANEOUS | Status: DC
  Filled 2017-03-17 (×2): qty 118

## 2017-03-17 MED ORDER — LIVING WELL WITH DIABETES BOOK
Freq: Once | Status: AC
  Administered 2017-03-17: 13:00:00
  Filled 2017-03-17: qty 1

## 2017-03-17 MED ORDER — RIFAMPIN 300 MG PO CAPS
300.0000 mg | ORAL_CAPSULE | Freq: Two times a day (BID) | ORAL | Status: DC
  Administered 2017-03-17 – 2017-03-21 (×8): 300 mg via ORAL
  Filled 2017-03-17 (×9): qty 1

## 2017-03-17 NOTE — Plan of Care (Signed)
Problem: Activity: Goal: Ability to return to normal activity level will improve Outcome: Progressing Pt's tolerance for activity will return to normal prior to discharge.  Problem: Pain Management: Goal: General experience of comfort will improve Outcome: Progressing Pt's pain level will be at goal tolerance level prior to discharge.  Problem: Skin Integrity: Goal: Demonstration of wound healing without infection will improve Outcome: Progressing Pt or family member will demonstrate wound care prior to discharge.

## 2017-03-17 NOTE — Progress Notes (Addendum)
Spoke with pt and his wife about his care plan.  They are concerned regarding the use of cefazolin in light of it's perceived failure to cure his infection.  I suggested that there is no indication for changing him to vancomycin . He has had previous renal toxicity from this.  He is on high dose of cefazolin.  I suggested use of rifampin as a second agent (will ask for pharm assistance given the presence of statin). I suggested that using gentamycin as a second agent would be too toxic. Using a quinolone is possible but has increased risk of C diff and can have difficulties with absorption as well.  His LFTs were slightly elevated prior- will recheck in AM.  I counseled them on secretions turning red due to rifampin.  I suggested that getting a MRI before stopping IV therapy may be useful to assure that there are no undrained focus of infection (more likely than antibiotic failure).  They agree with these plans and are happy with the care provided them by [facility].  Will f/u 9-5

## 2017-03-17 NOTE — Progress Notes (Addendum)
Patient seen and examined, agree with above note.  I dictated the care and orders written for this patient under my direction. I was immediately available on site, by phone/page to assist in the care of this patient.   [facility]  Date of Admission:  03/10/2017    Reason for Admissions: Osteomyelitis of ankle, right, acute (HCC)  Total days of antibiotics 7 Cefazolin        Assessment: 1. Right ankle infection with septic arthritis, osteomyelitis and large medial abscess.  S/P I&D (Evans) 2. DM2 with complications  Plan: 1. Continue Ancef - 8 weeks duration of therapy after last washout to ensure adequate source control 2. Scheduled for further debridement tomorrow 9/5.  3.  Speak with pt, wife, surgery today at 5pm 4. Pt last A1C 6.2 % (from 13%), states his vasc eval has been normal.    Rexene Alberts, MSN, NP-C [facility] Pager: [phone]  03/17/2017 11:10 AM      Principal Problem:   Osteomyelitis of ankle, right, acute (HCC) Active Problems:   Type II diabetes mellitus (HCC)   HTN (hypertension)   Hypertension   Acute kidney injury (HCC)   Acute hyperkalemia   Anemia, chronic disease   . aspirin EC  81 mg Oral Daily  . atorvastatin  40 mg Oral q1800  . enoxaparin (LOVENOX) injection  40 mg Subcutaneous Daily  . ferrous sulfate  325 mg Oral TID WC  . gabapentin  1,800 mg Oral TID  . insulin aspart  0-15 Units Subcutaneous TID WC  . insulin aspart  0-5 Units Subcutaneous QHS  . insulin aspart  10 Units Subcutaneous TID WC  . insulin glargine  80 Units Subcutaneous QHS  . living well with diabetes book   Does not apply Once  . PARoxetine  20 mg Oral Daily  . sodium chloride flush  3 mL Intravenous Q12H  . tamsulosin  0.4 mg Oral Daily    SUBJECTIVE: Interval History: WBC stable and afebrile overnight. Remains on Ancef with PICC in place in RUE. Compliant with NWB  status and trying to best control his blood sugars.   Review of Systems: Review of Systems  Constitutional: Negative for chills and fever.  Respiratory: Negative.   Cardiovascular: Negative.   Gastrointestinal: Negative for diarrhea, nausea and vomiting.  Musculoskeletal: Positive for joint pain (right foot).  Skin: Negative for rash.  Neurological: Negative for dizziness and headaches.    Allergies  Allergen Reactions  . Daptomycin Anaphylaxis and Shortness Of Breath    Breathing issues  . Lisinopril Other (See Comments)    Cough    OBJECTIVE: Vitals:   03/16/17 1425 03/16/17 2111 03/17/17 0516 03/17/17 0751  BP: 139/73 138/71 118/71 117/79  Pulse: 99 96 81 90  Resp: 20 18 18 17   Temp: 98 F (36.7 C) 98.9 F (37.2 C) 98.4 F (36.9 C) 98.1 F (36.7 C)  TempSrc: Oral Oral Oral Oral  SpO2: 100% 98% 96% 95%  Weight:      Height:       Body mass index is 31.52 kg/m.  Physical Exam  Constitutional: He is oriented to person, place, and time and well-developed, well-nourished, and in no distress.  Cardiovascular: Normal rate, regular rhythm, normal heart sounds and intact distal pulses.   Pulmonary/Chest: Effort normal and breath sounds normal. He has no rales.  Abdominal: Soft. Bowel sounds are normal. He exhibits no distension. There is no  tenderness.  Musculoskeletal:  RLE wrapped with kerlix. To be changed later today with Dr. Logan BoresEvans   Neurological: He is alert and oriented to person, place, and time.  Skin: Skin is warm and dry.    Lab Results Lab Results  Component Value Date   WBC 7.7 03/17/2017   HGB 9.7 (L) 03/17/2017   HCT 31.6 (L) 03/17/2017   MCV 80.8 03/17/2017   PLT 402 (H) 03/17/2017    Lab Results  Component Value Date   CREATININE 0.84 03/17/2017   BUN 23 (H) 03/17/2017   NA 134 (L) 03/17/2017   K 4.2 03/17/2017   CL 101 03/17/2017   CO2 26 03/17/2017    Lab Results  Component Value Date   ALT 69 (H) 03/10/2017   AST 72 (H) 03/10/2017     ALKPHOS 104 03/10/2017   BILITOT 0.7 03/10/2017     Microbiology: Recent Results (from the past 240 hour(s))  Culture, blood (Routine X 2) w Reflex to ID Panel     Status: None   Collection Time: 03/10/17 12:23 PM  Result Value Ref Range Status   Specimen Description BLOOD RIGHT ANTECUBITAL  Final   Special Requests IN PEDIATRIC BOTTLE Blood Culture adequate volume  Final   Culture NO GROWTH 5 DAYS  Final   Report Status 03/15/2017 FINAL  Final  Culture, blood (Routine X 2) w Reflex to ID Panel     Status: None   Collection Time: 03/10/17 12:31 PM  Result Value Ref Range Status   Specimen Description BLOOD RIGHT ANTECUBITAL  Final   Special Requests IN PEDIATRIC BOTTLE Blood Culture adequate volume  Final   Culture NO GROWTH 5 DAYS  Final   Report Status 03/15/2017 FINAL  Final  Surgical pcr screen     Status: None   Collection Time: 03/11/17 10:02 PM  Result Value Ref Range Status   MRSA, PCR NEGATIVE NEGATIVE Final   Staphylococcus aureus NEGATIVE NEGATIVE Final    Comment: (NOTE) The Xpert SA Assay (FDA approved for NASAL specimens in patients 46 years of age and older), is one component of a comprehensive surveillance program. It is not intended to diagnose infection nor to guide or monitor treatment.   Aerobic/Anaerobic Culture (surgical/deep wound)     Status: None   Collection Time: 03/12/17  5:25 PM  Result Value Ref Range Status   Specimen Description ABSCESS RIGHT ANKLE  Final   Special Requests NONE  Final   Gram Stain   Final    MODERATE WBC PRESENT,BOTH PMN AND MONONUCLEAR NO ORGANISMS SEEN    Culture   Final    RARE STAPHYLOCOCCUS AUREUS NO ANAEROBES ISOLATED    Report Status 03/17/2017 FINAL  Final   Organism ID, Bacteria STAPHYLOCOCCUS AUREUS  Final      Susceptibility   Staphylococcus aureus - MIC*    CIPROFLOXACIN <=0.5 SENSITIVE Sensitive     ERYTHROMYCIN <=0.25 SENSITIVE Sensitive     GENTAMICIN <=0.5 SENSITIVE Sensitive     OXACILLIN 0.5  SENSITIVE Sensitive     TETRACYCLINE <=1 SENSITIVE Sensitive     VANCOMYCIN <=0.5 SENSITIVE Sensitive     TRIMETH/SULFA <=10 SENSITIVE Sensitive     CLINDAMYCIN <=0.25 SENSITIVE Sensitive     RIFAMPIN <=0.5 SENSITIVE Sensitive     Inducible Clindamycin NEGATIVE Sensitive     * RARE STAPHYLOCOCCUS AUREUS    Rexene AlbertsStephanie Dixon, MSN, NP-C [facility] Cell: [phone] Pager: [phone]  @TODAY @ 10:45 AM

## 2017-03-17 NOTE — Progress Notes (Signed)
Inpatient Diabetes Program Recommendations  AACE/ADA: New Consensus Statement on Inpatient Glycemic Control (2015)  Target Ranges:  Prepandial:   less than 140 mg/dL      Peak postprandial:   less than 180 mg/dL (1-2 hours)      Critically ill patients:  140 - 180 mg/dL   Lab Results  Component Value Date   GLUCAP 133 (H) 03/17/2017   HGBA1C 6.2 (H) 03/10/2017    Review of Glycemic Control  Results for [name], Keith [name] (MRN [id number]) as of 03/17/2017 08:47  Ref. Range 03/14/2017 22:32 03/16/2017 11:44 03/16/2017 16:53 03/16/2017 21:12 03/17/2017 07:51  Glucose-Capillary Latest Ref Range: 65 - 99 mg/dL 409249 (H) 811186 (H) 914147 (H) 273 (H) 133 (H)    Diabetes history: Type 2 Outpatient Diabetes medications: Novolog 15 units tid, Lantus 24 units qam, 94 units qhs Current orders for Inpatient glycemic control: Novolog 10 units tid, Novolog moderate correction 0-15 units tid, Novolog 0-5 units qhs,Lantus 80 units qhs  Inpatient Diabetes Program Recommendations: Agree with current medications for blood sugar management.  I have placed an order for Living Well with Diabetes book.   Susette RacerJulie Bernis Stecher, RN, BA, MHA, CDE Diabetes Coordinator Inpatient Diabetes Program  [phone] (Team Pager) [phone] [facility] Office) 03/17/2017 8:55 AM

## 2017-03-17 NOTE — Progress Notes (Signed)
   PODIATRY PROGRESS NOTE  HPI: DMII patient is status post I&D right ankle on 03/12/2017 with large medial and lateral incision sites. Patient recently seen by infectious disease and currently managing IV antibiotic regimen.  Past Medical History:  Diagnosis Date  . Diabetic peripheral neuropathy (HCC)   . Hyperlipidemia   . Hypertension   . Type I diabetes mellitus (HCC)      Physical Exam: Dressings clean dry and intact to the right foot and ankle. Capillary refill within normal limits to the digits of the right lower extremity. Skin wrinkles noted to the forefoot indicated of of reduced edema and swelling. Neurovascular status intact. Patient appears healthy without any sign of distress.   Assessment: 1.  status post I&D right ankle  DOS: 03/12/2017  2. Type II DM 3. Recurrent MSSA  Plan of Care:  1. Patient was evaluated. 2.  tomorrow we are going to plan a second irrigation and debridement of the right foot and ankle with application of negative pressure wound VAC.  3. Preoperative orders placed. NPO after breakfast (9am) 4. Surgery tentatively scheduled for after 5 PM   Felecia ShellingBrent M. Evans, DPM [facility]  Dr. Felecia ShellingBrent M. Evans, DPM    [address]                                        [place], [place] [address]                Office [phone]  Fax [phone]

## 2017-03-17 NOTE — Progress Notes (Signed)
[facility]                                                                              Patient Demographics  Keith [name], is a 46 y.o. male, DOB - 02/24/1971, XLK:[id number]  Admit date - 03/10/2017   Admitting Physician Ozella Rocksavid J Merrell, MD  Outpatient Primary MD for the patient is [facility]  Outpatient specialists:   LOS - 7  days   Medical records reviewed and are as summarized below:    No chief complaint on file.      Brief summary   Keith [name] is a 46 y.o. male with medical history significant for diabetes mellitus 2 on insulin, obesity, BPH on Flomax, hypertension, dyslipidemia, peripheral neuropathy, and septic arthritis of right ankle. [facility] for septic right ankle. At that time there was no open wound. Podiatrist recommended to discontinue antibiotics given during the hospitalization. He had previously been treated with Bactrim for 2 weeks. Unfortunately the ankle became worse and he developed an open wound. He also underwent intraoperative I&D on 6/14 with cultures being positive for MSSA and he was started on amoxicillin. He was evaluated on 6/29 by ID and changed to IV cefazolin via PICC line with this drug actually initiated on 7/3. He completed 6 weeks of antibiotics on 8/14. Shortly after completing these antibiotics he noticed increased swelling and drainage from the ankle and increasing severe shooting type pains. He followed up with his podiatrist on 8/20 and wound cultures again were positive for MSSA. He was started on Bactrim. Oral ciprofloxacin was added on 8/22. Today he followed up with the ID clinic/Dr. Orvan Falconerampbell complaining of extreme weakness, no appetite, and weight loss of 20 pounds. Clinically he appeared pale and was tachycardic with SBP in the 90s. He was sent directly from the office to the hospital for admission. Of note the ankle wound which had nearly healed from the previous surgery and  antibiotic treatment has now opened and is at least 3 x 4 cm.   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, per patient became significantly worse after finishing IV antibiotics - MRI of the right foot showed diffuse osteomyelitis involving all the bones of the hindfoot and midfoot, basis of second through fifth metatarsals, TB or talar and subtalar joint septic arthritis with large loculated abscess along the medial and lateral ankle which communicate with the tibiotalar joint, 6.6 cm - or I&D done on 8/31 by Dr Logan BoresEvans, high risk for below-knee amputation, recommended long-term IV antibiotics per ID unless patient considers BKA - Cultures growing MSSA, continue IV cefazolin, ID following - d/w Dr Logan BoresEvans this morning, patient will be evaluated today evening to reassess the wound and then considering washout, I&D tomorrow a.m. Will make nothing by mouth after midnight  Active Problems: Anemia, normocytic,postoperative blood loss and possibly hemodilution -H&H stable, 9.7 today  - after 2 units packed RBC transfusion of 9/1    Type II diabetes mellitus (HCC) now with hyperglycemia -  CBG is still elevated, continue Lantus 80 units at bedtime, NovoLog meal coverage increased to 10 units 3 times a day, Continue moderate sliding scale insulin - will be NPO after MN, hence no insulin changes today. If CBGs are still elevated, will add Lantus am dose     HTN (hypertension) -BP now stable, continue to hold antihypertensives    Acute kidney injury (HCC)acute hyperkalemia, metabolic acidosis - Secondary to dehydration, hypoperfusion, NSAIDs and ARB - Continue to hold offending medications - creatinine improved to 0.8, off IV fluids    Acute hyperkalemia - possibly due to NSAIDS, ARB - resolved    Anemia, chronic disease - currently stable, continue close monitoring, baseline hemoglobin 8.3 - H&H  stable, 9.7 today  Hyperlipidemia - Continue Lipitor  BPH, no urinary obstruction - continue Flomax  Code Status:  full DVT Prophylaxis:  Lovenox  Family Communication: Discussed in detail with the patient, all imaging results, lab results explained to the patient and discussed with patient's wife on the phone   Disposition Plan:   Time Spent in minutes   25 minutes  Procedures:  MRI of the right foot I&D of the right foot and ankle abscess  Consultants:   Infectious disease Podiatry  Antimicrobials:   IV Ancef, 8/28 >   Medications  Scheduled Meds: . aspirin EC  81 mg Oral Daily  . atorvastatin  40 mg Oral q1800  . enoxaparin (LOVENOX) injection  40 mg Subcutaneous Daily  . ferrous sulfate  325 mg Oral TID WC  . gabapentin  1,800 mg Oral TID  . insulin aspart  0-15 Units Subcutaneous TID WC  . insulin aspart  0-5 Units Subcutaneous QHS  . insulin aspart  10 Units Subcutaneous TID WC  . insulin glargine  80 Units Subcutaneous QHS  . living well with diabetes book   Does not apply Once  . PARoxetine  20 mg Oral Daily  . sodium chloride flush  3 mL Intravenous Q12H  . tamsulosin  0.4 mg Oral Daily   Continuous Infusions: . sodium chloride    .  ceFAZolin (ANCEF) IV Stopped (03/17/17 1610)   PRN Meds:.sodium chloride, acetaminophen, HYDROcodone-acetaminophen, morphine injection, morphine injection, ondansetron **OR** ondansetron (ZOFRAN) IV, oxyCODONE-acetaminophen, promethazine, sodium chloride flush, sodium chloride flush   Antibiotics   Anti-infectives    Start     Dose/Rate Route Frequency Ordered Stop   03/10/17 1230  ceFAZolin (ANCEF) IVPB 2g/100 mL premix     2 g 200 mL/hr over 30 Minutes Intravenous Every 8 hours 03/10/17 1158          Subjective:   Keith [name] was seen and examined today. Frustrated, was under the impression that he was going to have I&D done today. No fevers and chills.  Patient denies dizziness, chest pain, shortness of  breath, abdominal pain, N/V/D/C, new weakness, numbess, tingling.   Objective:   Vitals:   03/16/17 2111 03/17/17 0516 03/17/17 0751 03/17/17 1206  BP: 138/71 118/71 117/79 112/66  Pulse: 96 81 90 83  Resp: 18 18 17 17   Temp: 98.9 F (37.2 C) 98.4 F (36.9 C) 98.1 F (36.7 C) 98.6 F (37 C)  TempSrc: Oral Oral Oral Oral  SpO2: 98% 96% 95% 97%  Weight:      Height:        Intake/Output Summary (Last 24 hours) at 03/17/17 1250 Last data filed at 03/17/17 1000  Gross per 24 hour  Intake  790 ml  Output             1720 ml  Net             -930 ml     Wt Readings from Last 3 Encounters:  03/12/17 105.4 kg (232 lb 6.4 oz)  03/10/17 102.5 kg (226 lb)  02/19/17 109.8 kg (242 lb)     Exam   General: Alert and oriented x 3, NAD  Eyes:  HEENT:    Cardiovascular: S1 S2 auscultated. Regular rate and rhythm. No pedal edema b/l  Respiratory: Clear to auscultation bilaterally, no wheezing, rales or rhonchi  Gastrointestinal: Soft, nontender, nondistended, + bowel sounds  Ext: no pedal edema bilaterally  Neuro: No new deficits   Musculoskeletal: No digital cyanosis, clubbing  Skin: right foot dressing intact   Psych: Normal affect and demeanor, alert and oriented x3    Data Reviewed:  I have personally reviewed following labs and imaging studies  Micro Results Recent Results (from the past 240 hour(s))  Culture, blood (Routine X 2) w Reflex to ID Panel     Status: None   Collection Time: 03/10/17 12:23 PM  Result Value Ref Range Status   Specimen Description BLOOD RIGHT ANTECUBITAL  Final   Special Requests IN PEDIATRIC BOTTLE Blood Culture adequate volume  Final   Culture NO GROWTH 5 DAYS  Final   Report Status 03/15/2017 FINAL  Final  Culture, blood (Routine X 2) w Reflex to ID Panel     Status: None   Collection Time: 03/10/17 12:31 PM  Result Value Ref Range Status   Specimen Description BLOOD RIGHT ANTECUBITAL  Final   Special Requests IN  PEDIATRIC BOTTLE Blood Culture adequate volume  Final   Culture NO GROWTH 5 DAYS  Final   Report Status 03/15/2017 FINAL  Final  Surgical pcr screen     Status: None   Collection Time: 03/11/17 10:02 PM  Result Value Ref Range Status   MRSA, PCR NEGATIVE NEGATIVE Final   Staphylococcus aureus NEGATIVE NEGATIVE Final    Comment: (NOTE) The Xpert SA Assay (FDA approved for NASAL specimens in patients 61 years of age and older), is one component of a comprehensive surveillance program. It is not intended to diagnose infection nor to guide or monitor treatment.   Aerobic/Anaerobic Culture (surgical/deep wound)     Status: None   Collection Time: 03/12/17  5:25 PM  Result Value Ref Range Status   Specimen Description ABSCESS RIGHT ANKLE  Final   Special Requests NONE  Final   Gram Stain   Final    MODERATE WBC PRESENT,BOTH PMN AND MONONUCLEAR NO ORGANISMS SEEN    Culture   Final    RARE STAPHYLOCOCCUS AUREUS NO ANAEROBES ISOLATED    Report Status 03/17/2017 FINAL  Final   Organism ID, Bacteria STAPHYLOCOCCUS AUREUS  Final      Susceptibility   Staphylococcus aureus - MIC*    CIPROFLOXACIN <=0.5 SENSITIVE Sensitive     ERYTHROMYCIN <=0.25 SENSITIVE Sensitive     GENTAMICIN <=0.5 SENSITIVE Sensitive     OXACILLIN 0.5 SENSITIVE Sensitive     TETRACYCLINE <=1 SENSITIVE Sensitive     VANCOMYCIN <=0.5 SENSITIVE Sensitive     TRIMETH/SULFA <=10 SENSITIVE Sensitive     CLINDAMYCIN <=0.25 SENSITIVE Sensitive     RIFAMPIN <=0.5 SENSITIVE Sensitive     Inducible Clindamycin NEGATIVE Sensitive     * RARE STAPHYLOCOCCUS AUREUS    Radiology Reports Mr Ankle Right W Wo Contrast  Result Date: 03/11/2017 CLINICAL DATA:  Right ankle osteomyelitis. EXAM: MRI OF THE RIGHT ANKLE WITHOUT AND WITH CONTRAST TECHNIQUE: Multiplanar, multisequence MR imaging of the ankle was performed before and after the administration of intravenous contrast. CONTRAST:  20mL MULTIHANCE GADOBENATE DIMEGLUMINE 529  MG/ML IV SOLN COMPARISON:  Right ankle x-rays dated December 12, 2016. Right foot MRI dated Nov 27, 2016. FINDINGS: TENDONS Peroneal: Mildly thickened. Small amount of fluid in the tendon sheath at the retromalleolar groove. Additional small amount of fluid in the peroneus brevis tendon sheath near its insertion on the base of fifth metatarsal. Posteromedial: Grossly intact. Anterior: Intact. Achilles: Intact. Plantar Fascia: Mild thickening of the plantar fascia. LIGAMENTS Lateral: The anterior talofibular and calcaneofibular ligaments are attenuated and likely chronically torn. Medial: Grossly intact. CARTILAGE Ankle Joint: There is a large ankle joint effusion with enhancing synovium. Subtalar Joints/Sinus Tarsi: Moderate subtalar joint effusion with enhancing synovium. Abnormal signal within the sinus tarsi. Bones: There is prominent, enhancing marrow edema with corresponding T1 hypointensity and cortical destruction involving the distal tibia, distal fibula, talus, calcaneus, cuboid, navicular, all 3 cuneiforms, and the bases of the second through fifth metatarsals. There is a chronic fracture through the talar neck. Other: There is an open soft tissue defect along the medial ankle. There is a large multiloculated, rim enhancing fluid collection along the medial ankle, measuring approximately 6.2 x 2.4 x 6.6 cm (AP by transverse by CC) . This fluid collection likely communicates with the ankle joint and courses around the flexor hallucis longus muscle and tendon. There is an additional multiloculated 4.8 x 1.6 x 3.7 cm (AP by transverse by CC) rim enhancing fluid collection along the lateral ankle which also communicates with the tibiotalar joint. IMPRESSION: IMPRESSION 1. Diffuse osteomyelitis involving all of the bones of the the hindfoot and midfoot, as well as the bases of the second through fifth metatarsals, as described above, with chronic fracture through the talar neck. 2. Tibiotalar and subtalar joint  septic arthritis with large, loculated abscesses along the medial and lateral ankle, which communicate with the tibiotalar joint. The larger fluid collection along the medial ankle measures up to 6.6 cm. 3. Small amount of fluid in the peroneal tendon sheath and distal peroneus brevis tendon sheath, concerning for infectious tenosynovitis. Likely infectious tenosynovitis of the flexor hallucis longus as well. Electronically Signed   By: Obie Dredge M.D.   On: 03/11/2017 16:49    Lab Data:  CBC:  Recent Labs Lab 03/13/17 0421 03/14/17 0414 03/14/17 2158 03/15/17 0350 03/16/17 0349 03/17/17 0457  WBC 10.1 6.6  --  6.3 6.6 7.7  HGB 8.8* 7.9* 9.6* 9.4* 9.6* 9.7*  HCT 29.3* 25.6* 30.4* 29.4* 31.0* 31.6*  MCV 79.4 79.0  --  79.5 80.7 80.8  PLT 358 355  --  349 367 402*   Basic Metabolic Panel:  Recent Labs Lab 03/13/17 0421 03/14/17 0414 03/15/17 0350 03/16/17 0349 03/17/17 0457  NA 131* 137 135 133* 134*  K 5.0 4.2 4.2 4.4 4.2  CL 100* 107 103 102 101  CO2 23 24 25 25 26   GLUCOSE 344* 126* 240* 241* 152*  BUN 27* 24* 22* 19 23*  CREATININE 0.98 0.91 0.98 0.85 0.84  CALCIUM 9.3 9.0 9.0 9.3 9.5   GFR: Estimated Creatinine Clearance: 137.9 mL/min (by C-G formula based on SCr of 0.84 mg/dL). Liver Function Tests: No results for input(s): AST, ALT, ALKPHOS, BILITOT, PROT, ALBUMIN in the last 168 hours. No results for input(s): LIPASE, AMYLASE in  the last 168 hours. No results for input(s): AMMONIA in the last 168 hours. Coagulation Profile: No results for input(s): INR, PROTIME in the last 168 hours. Cardiac Enzymes: No results for input(s): CKTOTAL, CKMB, CKMBINDEX, TROPONINI in the last 168 hours. BNP (last 3 results) No results for input(s): PROBNP in the last 8760 hours. HbA1C: No results for input(s): HGBA1C in the last 72 hours. CBG:  Recent Labs Lab 03/16/17 1144 03/16/17 1653 03/16/17 2112 03/17/17 0751 03/17/17 1205  GLUCAP 186* 147* 273* 133* 190*    Lipid Profile: No results for input(s): CHOL, HDL, LDLCALC, TRIG, CHOLHDL, LDLDIRECT in the last 72 hours. Thyroid Function Tests: No results for input(s): TSH, T4TOTAL, FREET4, T3FREE, THYROIDAB in the last 72 hours. Anemia Panel: No results for input(s): VITAMINB12, FOLATE, FERRITIN, TIBC, IRON, RETICCTPCT in the last 72 hours. Urine analysis:    Component Value Date/Time   COLORURINE GREEN (A) 11/06/2016 1758   APPEARANCEUR CLOUDY (A) 11/06/2016 1758   LABSPEC 1.018 11/06/2016 1758   PHURINE 5.0 11/06/2016 1758   GLUCOSEU NEGATIVE 11/06/2016 1758   HGBUR SMALL (A) 11/06/2016 1758   BILIRUBINUR NEGATIVE 11/06/2016 1758   KETONESUR NEGATIVE 11/06/2016 1758   PROTEINUR 100 (A) 11/06/2016 1758   NITRITE NEGATIVE 11/06/2016 1758   LEUKOCYTESUR LARGE (A) 11/06/2016 1758     Ripudeep Rai M.D. [facility] 03/17/2017, 12:50 PM  Pager: [phone] Between 7am to 7pm - call Pager - [phone]  After 7pm go to [email or web address] - password [email or web address]  Call night coverage person covering after 7pm

## 2017-03-17 NOTE — Progress Notes (Signed)
Pharmacy Antibiotic Note  Keith [name] is a 46 y.o. male admitted on 03/10/2017 with continuing issues with MSSA septic arthritis of the R-ankle. Pharmacy has been consulted for Rifampin dosing in addition to Cefazolin therapy.   The patient is noted to be on atorvastatin 40 mg daily at bedtime. Rifampin is known to decrease atorvastatin concentrations by 80% during concurrent use. Both Rifampin and atorvastatin have warnings/precautions listed for hepatotoxicity therefore LFTs should be monitored closely. Per care everywhere on 12/26/16, the patient's cholesterol panel was noted to be at goal and controlled with TC 97, TG 79, LDL 57, HDL 24.   One option would be to hold atorvastatin initially with the start of rifampin to avoid the risk of hepatotoxicity and the re-initiate the statin once the rifampin course has been completed. Since the patient appears to be well-controlled, this may be a reasonable option. Another option would be to monitor LFTs closely while on concurrent therapy and to consider holding the statin only if effects on LFTs start to be seen. The patient's LFTs on 03/10/17 were AST/ALT 72/69, noted plans to repeat labs in the AM.  Options were discussed with Dr. Isidoro Donningai and it was decided to hold the statin while adding rifampin to help treat the infection. The statin will need to be restarted whenever the patient completes his antibiotic course. This was discussed with the patient and his wife.   Plan: 1. Start Rifampin 300 mg po bid 2. Hold atorvastatin while on Rifampin with plans to resume once the patient completes their antibiotic course 3. Will monitor LFTs, noted hepatic panel ordered for 9/5 AM   Height: 6' (182.9 cm) Weight: 232 lb 6.4 oz (105.4 kg) IBW/kg (Calculated) : 77.6  Temp (24hrs), Avg:98.5 F (36.9 C), Min:98.1 F (36.7 C), Max:98.9 F (37.2 C)   Recent Labs Lab 03/13/17 0421 03/14/17 0414 03/15/17 0350 03/16/17 0349 03/17/17 0457  WBC 10.1 6.6 6.3  6.6 7.7  CREATININE 0.98 0.91 0.98 0.85 0.84    Estimated Creatinine Clearance: 137.9 mL/min (by C-G formula based on SCr of 0.84 mg/dL).    Allergies  Allergen Reactions  . Daptomycin Anaphylaxis and Shortness Of Breath    Breathing issues  . Lisinopril Other (See Comments)    Cough    Antimicrobials this admission: Cefazolin PTA >> Rifampin 9/4 >>  Microbiology results: 8/29: MRSA PCR >> negative 8/28 BCx >> NG 8/30 R-ankle >> MSSA  Thank you for allowing pharmacy to be a part of this patient's care.  Georgina PillionElizabeth Zyionna Pesce, PharmD, BCPS Clinical Pharmacist Pager: [phone] Clinical phone for 03/17/2017 from 2:30p-11p: [phone] If after 3:30p, please call main pharmacy at: [phone] 03/17/2017 7:02 PM

## 2017-03-18 DIAGNOSIS — E1159 Type 2 diabetes mellitus with other circulatory complications: Secondary | ICD-10-CM

## 2017-03-18 DIAGNOSIS — D638 Anemia in other chronic diseases classified elsewhere: Secondary | ICD-10-CM

## 2017-03-18 DIAGNOSIS — E875 Hyperkalemia: Secondary | ICD-10-CM

## 2017-03-18 DIAGNOSIS — E1042 Type 1 diabetes mellitus with diabetic polyneuropathy: Secondary | ICD-10-CM

## 2017-03-18 DIAGNOSIS — E7800 Pure hypercholesterolemia, unspecified: Secondary | ICD-10-CM

## 2017-03-18 DIAGNOSIS — N400 Enlarged prostate without lower urinary tract symptoms: Secondary | ICD-10-CM

## 2017-03-18 DIAGNOSIS — I10 Essential (primary) hypertension: Secondary | ICD-10-CM

## 2017-03-18 DIAGNOSIS — M86171 Other acute osteomyelitis, right ankle and foot: Principal | ICD-10-CM

## 2017-03-18 DIAGNOSIS — E119 Type 2 diabetes mellitus without complications: Secondary | ICD-10-CM

## 2017-03-18 DIAGNOSIS — M009 Pyogenic arthritis, unspecified: Secondary | ICD-10-CM

## 2017-03-18 LAB — HEPATIC FUNCTION PANEL
ALT: 40 U/L (ref 17–63)
AST: 33 U/L (ref 15–41)
Albumin: 2.5 g/dL — ABNORMAL LOW (ref 3.5–5.0)
Alkaline Phosphatase: 58 U/L (ref 38–126)
BILIRUBIN DIRECT: 0.1 mg/dL (ref 0.1–0.5)
Indirect Bilirubin: 0.4 mg/dL (ref 0.3–0.9)
Total Bilirubin: 0.5 mg/dL (ref 0.3–1.2)
Total Protein: 7.2 g/dL (ref 6.5–8.1)

## 2017-03-18 LAB — GLUCOSE, CAPILLARY
GLUCOSE-CAPILLARY: 122 mg/dL — AB (ref 65–99)
GLUCOSE-CAPILLARY: 139 mg/dL — AB (ref 65–99)
GLUCOSE-CAPILLARY: 103 mg/dL — AB (ref 65–99)
GLUCOSE-CAPILLARY: 254 mg/dL — AB (ref 65–99)

## 2017-03-18 LAB — BASIC METABOLIC PANEL
ANION GAP: 7 (ref 5–15)
BUN: 25 mg/dL — AB (ref 6–20)
CHLORIDE: 101 mmol/L (ref 101–111)
CO2: 26 mmol/L (ref 22–32)
Calcium: 9.4 mg/dL (ref 8.9–10.3)
Creatinine, Ser: 0.89 mg/dL (ref 0.61–1.24)
GFR calc Af Amer: >60 mL/min (ref >60)
GFR calc non Af Amer: >60 mL/min (ref >60)
GLUCOSE: 168 mg/dL — AB (ref 65–99)
Potassium: 4.3 mmol/L (ref 3.5–5.1)
Sodium: 134 mmol/L — ABNORMAL LOW (ref 135–145)

## 2017-03-18 LAB — CBC
HEMATOCRIT: 31.6 % — AB (ref 39.0–52.0)
HEMOGLOBIN: 9.8 g/dL — AB (ref 13.0–17.0)
MCH: 25 pg — AB (ref 26.0–34.0)
MCHC: 31 g/dL (ref 30.0–36.0)
MCV: 80.6 fL (ref 78.0–100.0)
Platelets: 422 10*3/uL — ABNORMAL HIGH (ref 150–400)
RBC: 3.92 MIL/uL — AB (ref 4.22–5.81)
RDW: 16.9 % — ABNORMAL HIGH (ref 11.5–15.5)
WBC: 7.4 10*3/uL (ref 4.0–10.5)

## 2017-03-18 NOTE — Progress Notes (Signed)
[facility]  Date of Admission:  03/10/2017     Total days of antibiotics 7 Cefazolin, 1 Rifampin          ASSESSMENT: Right ankle infection with septic arthritis, osteomyelitis and large medial abscess.  PLAN: 1. Surgery tonight for debridement and vac placement 2. Continue Cefazolin + Rifampin  3. Discussed with [facility] - he used them before. PICC in place and hopefully will be ready to D/C back home over weekend.  4. Will need to monitor LFTs on Rifampin therapy outpatient  5. MRI to be repeated at least 6 weeks post IV treatment to reassess prior to stopping IV abx.    Rexene AlbertsStephanie Avanish Cerullo, MSN, NP-C [facility] Pager: [phone]  03/18/2017 11:23 AM     Principal Problem:   Osteomyelitis of ankle, right, acute (HCC) Active Problems:   Type II diabetes mellitus (HCC)   HTN (hypertension)   Hypertension   Acute kidney injury (HCC)   Acute hyperkalemia   Anemia, chronic disease   . aspirin EC  81 mg Oral Daily  . enoxaparin (LOVENOX) injection  40 mg Subcutaneous Daily  . ferrous sulfate  325 mg Oral TID WC  . gabapentin  1,800 mg Oral TID  . insulin aspart  0-15 Units Subcutaneous TID WC  . insulin aspart  0-5 Units Subcutaneous QHS  . insulin aspart  10 Units Subcutaneous TID WC  . insulin glargine  80 Units Subcutaneous QHS  . PARoxetine  20 mg Oral Daily  . povidone-iodine   Topical Once  . rifampin  300 mg Oral Q12H  . sodium chloride flush  3 mL Intravenous Q12H  . tamsulosin  0.4 mg Oral Daily    SUBJECTIVE: Feeling tired today. Happy we had discussion of plan and encouraged he will have wound vac since it helped so well last time. Afebrile. Tolerating the rifampin well (has had 2 doses so far).   Review of Systems: Review of Systems  Constitutional: Negative for chills and fever.  Respiratory: Negative for cough and shortness of breath.     Cardiovascular: Negative for chest pain.  Gastrointestinal: Negative for diarrhea, nausea and vomiting.  Genitourinary: Negative for dysuria.  Musculoskeletal: Positive for joint pain.  Skin: Negative for itching and rash.  Neurological: Negative for headaches.    Allergies  Allergen Reactions  . Daptomycin Anaphylaxis and Shortness Of Breath    Breathing issues  . Lisinopril Other (See Comments)    Cough    OBJECTIVE: Vitals:   03/17/17 1609 03/17/17 2137 03/18/17 0602 03/18/17 0759  BP: 127/71 117/68 120/68 121/73  Pulse: 94 87 89 90  Resp: 18 19 19 18   Temp: 98.4 F (36.9 C) 98.2 F (36.8 C) 98.2 F (36.8 C) 98.3 F (36.8 C)  TempSrc: Oral Oral Oral Oral  SpO2: 97% 99% 98% 97%  Weight:      Height:       Body mass index is 31.52 kg/m.  Physical Exam  Constitutional: He is oriented to person, place, and time and well-developed, well-nourished, and in no distress.  Eyes: No scleral icterus.  Cardiovascular: Normal rate, regular rhythm and normal heart sounds.   No murmur heard. Pulmonary/Chest: Effort normal and breath sounds normal.  Abdominal: Soft. Bowel sounds are normal. He exhibits no distension.  Musculoskeletal:  RLE wrapped in Kerlix - large serosanguinous drainage.   Neurological: He is alert and oriented to  person, place, and time.  Skin: Skin is warm and dry. No rash noted.    Lab Results Lab Results  Component Value Date   WBC 7.4 03/18/2017   HGB 9.8 (L) 03/18/2017   HCT 31.6 (L) 03/18/2017   MCV 80.6 03/18/2017   PLT 422 (H) 03/18/2017    Lab Results  Component Value Date   CREATININE 0.89 03/18/2017   BUN 25 (H) 03/18/2017   NA 134 (L) 03/18/2017   K 4.3 03/18/2017   CL 101 03/18/2017   CO2 26 03/18/2017    Lab Results  Component Value Date   ALT 40 03/18/2017   AST 33 03/18/2017   ALKPHOS 58 03/18/2017   BILITOT 0.5 03/18/2017     Microbiology: Recent Results (from the past 240 hour(s))  Culture, blood (Routine X 2) w  Reflex to ID Panel     Status: None   Collection Time: 03/10/17 12:23 PM  Result Value Ref Range Status   Specimen Description BLOOD RIGHT ANTECUBITAL  Final   Special Requests IN PEDIATRIC BOTTLE Blood Culture adequate volume  Final   Culture NO GROWTH 5 DAYS  Final   Report Status 03/15/2017 FINAL  Final  Culture, blood (Routine X 2) w Reflex to ID Panel     Status: None   Collection Time: 03/10/17 12:31 PM  Result Value Ref Range Status   Specimen Description BLOOD RIGHT ANTECUBITAL  Final   Special Requests IN PEDIATRIC BOTTLE Blood Culture adequate volume  Final   Culture NO GROWTH 5 DAYS  Final   Report Status 03/15/2017 FINAL  Final  Surgical pcr screen     Status: None   Collection Time: 03/11/17 10:02 PM  Result Value Ref Range Status   MRSA, PCR NEGATIVE NEGATIVE Final   Staphylococcus aureus NEGATIVE NEGATIVE Final    Comment: (NOTE) The Xpert SA Assay (FDA approved for NASAL specimens in patients 79 years of age and older), is one component of a comprehensive surveillance program. It is not intended to diagnose infection nor to guide or monitor treatment.   Aerobic/Anaerobic Culture (surgical/deep wound)     Status: None   Collection Time: 03/12/17  5:25 PM  Result Value Ref Range Status   Specimen Description ABSCESS RIGHT ANKLE  Final   Special Requests NONE  Final   Gram Stain   Final    MODERATE WBC PRESENT,BOTH PMN AND MONONUCLEAR NO ORGANISMS SEEN    Culture   Final    RARE STAPHYLOCOCCUS AUREUS NO ANAEROBES ISOLATED    Report Status 03/17/2017 FINAL  Final   Organism ID, Bacteria STAPHYLOCOCCUS AUREUS  Final      Susceptibility   Staphylococcus aureus - MIC*    CIPROFLOXACIN <=0.5 SENSITIVE Sensitive     ERYTHROMYCIN <=0.25 SENSITIVE Sensitive     GENTAMICIN <=0.5 SENSITIVE Sensitive     OXACILLIN 0.5 SENSITIVE Sensitive     TETRACYCLINE <=1 SENSITIVE Sensitive     VANCOMYCIN <=0.5 SENSITIVE Sensitive     TRIMETH/SULFA <=10 SENSITIVE Sensitive      CLINDAMYCIN <=0.25 SENSITIVE Sensitive     RIFAMPIN <=0.5 SENSITIVE Sensitive     Inducible Clindamycin NEGATIVE Sensitive     * RARE STAPHYLOCOCCUS AUREUS

## 2017-03-18 NOTE — Progress Notes (Signed)
PROGRESS NOTE    Keith [name]  ZOX:[id number] DOB: 06/11/1971 DOA: 03/10/2017 PCP: [facility]    Brief Narrative:  Keith E Wrightis a 46 y.o.malewith medical history significant for diabetes mellitus 2 on insulin, obesity, BPH on Flomax, hypertension, dyslipidemia, peripheral neuropathy, and septic arthritis of right ankle. [facility]. At that time there was no open wound. Podiatrist recommended to discontinue antibiotics given during the hospitalization. He had previously been treated with Bactrim for 2 weeks. Unfortunately the ankle became worse and hedeveloped an open wound.He also underwent intraoperative I&D on 6/14 with cultures being positive for MSSA and he was started on amoxicillin. He was evaluated on 6/29 by ID and changedto IV cefazolin viaPICC line with this drug actually initiated on 7/3. He completed 6 weeks of antibiotics on 8/14. Shortly after completing these antibiotics he noticed increased swelling and drainage from the ankle and increasing severe shooting type pains. He followed up with his podiatrist on 8/20 and wound cultures again were positive for MSSA. He was started on Bactrim. Oral ciprofloxacin was added on 8/22. Today he followed up with the ID clinic/Dr. Orvan Falconer complaining of extreme weakness, no appetite, andweight loss of 20 pounds. Clinically he appeared pale and was tachycardic with SBPin the 90s. He was sent directly from the office to the hospital for admission. Of note the ankle wound which had nearly healed from the previous surgery and antibiotic treatment has now opened and is at least 3 x 4 cm.   Assessment & Plan:   Principal Problem:   Osteomyelitis of ankle, right, acute (HCC) Active Problems:   Septic arthritis of ankle or foot (HCC)   Type II diabetes mellitus (HCC)   HTN (hypertension)   BPH (benign prostatic hyperplasia)   Diabetic polyneuropathy associated with type 1  diabetes mellitus (HCC)   Hypercholesterolemia   Hypertension   Acute kidney injury (HCC)   Acute hyperkalemia   Anemia, chronic disease  #1 septic arthritis of the right foot/osteomyelitis of the right ankle and large medial abscess Patient with recurrent MSSA infection of the right ankle joint which had worsened with abscess formation after recent completion of IV antibiotics. MRI of the right foot consistent with osteomyelitis involving all bones of the hindfoot, midfoot, base of second through fifth metatarsals,talar, subtalar joint with large loculated medial abscess and lateral abscess communicating with tibiotalar joint, 6 cm. -Patient status post I and D for podiatry, Dr. Logan Bores 03/13/2017. Patient noted to be at increased risk for BKA. Long-term IV antibiotics recommended per ID unless patient undergoes BKA. - Wound cultures consistent with MSSA. - Continue rifampin and IV Ancef. - Patient for repeat I and D and application of negative pressure wound VAC per podiatry today 03/18/2017. - Antibiotics management per ID.  #2 anemia Likely secondary to postop blood loss versus hemodilution. Status post 2 units packed red blood cells.h/h stable.  #3 type 2 diabetes mellitus Hemoglobin A1c 6.2. CBGs ranging from 122-139. Continue Lantus and sliding scale insulin.  #4 hypertension Blood pressure stable. Continue to hold antihypertensive medications. Follow.  #5 acute kidney injury/hyperkalemia/metabolic acidosis Secondary to prerenal azotemia secondary to dehydration and hypoperfusion, NSAIDs and ARB. Nephrotoxic agents on hold. Renal function improved. Follow.  #6 acute hyperkalemia Resolved.  #7 hyperlipidemia Continue Lipitor.  #8 BPH Continue Flomax.     DVT prophylaxis: Lovenox Code Status: Full Family Communication: Updated patient. No family at bedside. Disposition Plan: Home with home health when cleared  by orthopedics.   Consultants:   Podiatry: Dr. Logan Bores  03/11/2017  Infectious disease: Dr. Orvan Falconer 03/10/2017  Procedures:   MRI right ankle 03/11/2017  Irrigation and debridment / incision and drainage right foot and ankle abscesses--- per Dr. Logan Bores 03/12/2017 2 units packed red blood cells 03/14/2017  Antimicrobials:   Rifampin 03/17/2017  IV Ancef 03/10/2017   Subjective: Patient sitting up in bed. No chest pain. No shortness of breath. Patient awaiting second I and D today.  Objective: Vitals:   03/18/17 0602 03/18/17 0759 03/18/17 1211 03/18/17 1611  BP: 120/68 121/73 112/71 118/71  Pulse: 89 90 80 84  Resp: 19 18 18 18   Temp: 98.2 F (36.8 C) 98.3 F (36.8 C) 98.2 F (36.8 C) 97.8 F (36.6 C)  TempSrc: Oral Oral Oral Oral  SpO2: 98% 97% 96% 98%  Weight:      Height:        Intake/Output Summary (Last 24 hours) at 03/18/17 1613 Last data filed at 03/18/17 1014  Gross per 24 hour  Intake              440 ml  Output             1750 ml  Net            -1310 ml   Filed Weights   03/11/17 0612 03/12/17 1610  Weight: 105.4 kg (232 lb 6.4 oz) 105.4 kg (232 lb 6.4 oz)    Examination:  General exam: Appears calm and comfortable. Respiratory system: Clear to auscultation. Respiratory effort normal. Cardiovascular system: S1 & S2 heard, RRR. No JVD, murmurs, rubs, gallops or clicks. No pedal edema. Gastrointestinal system: Abdomen is nondistended, soft and nontender. No organomegaly or masses felt. Normal bowel sounds heard. Central nervous system: Alert and oriented. No focal neurological deficits. Extremities: Right lower extremity with Kerlix dressing with some drainage noted.  Skin: No rashes, lesions or ulcers Psychiatry: Judgement and insight appear normal. Mood & affect appropriate.     Data Reviewed: I have personally reviewed following labs and imaging studies  CBC:  Recent Labs Lab 03/14/17 0414 03/14/17 2158 03/15/17 0350 03/16/17 0349 03/17/17 0457 03/18/17 0344  WBC 6.6  --  6.3 6.6 7.7  7.4  HGB 7.9* 9.6* 9.4* 9.6* 9.7* 9.8*  HCT 25.6* 30.4* 29.4* 31.0* 31.6* 31.6*  MCV 79.0  --  79.5 80.7 80.8 80.6  PLT 355  --  349 367 402* 422*   Basic Metabolic Panel:  Recent Labs Lab 03/14/17 0414 03/15/17 0350 03/16/17 0349 03/17/17 0457 03/18/17 0344  NA 137 135 133* 134* 134*  K 4.2 4.2 4.4 4.2 4.3  CL 107 103 102 101 101  CO2 24 25 25 26 26   GLUCOSE 126* 240* 241* 152* 168*  BUN 24* 22* 19 23* 25*  CREATININE 0.91 0.98 0.85 0.84 0.89  CALCIUM 9.0 9.0 9.3 9.5 9.4   GFR: Estimated Creatinine Clearance: 130.1 mL/min (by C-G formula based on SCr of 0.89 mg/dL). Liver Function Tests:  Recent Labs Lab 03/18/17 0344  AST 33  ALT 40  ALKPHOS 58  BILITOT 0.5  PROT 7.2  ALBUMIN 2.5*   No results for input(s): LIPASE, AMYLASE in the last 168 hours. No results for input(s): AMMONIA in the last 168 hours. Coagulation Profile: No results for input(s): INR, PROTIME in the last 168 hours. Cardiac Enzymes: No results for input(s): CKTOTAL, CKMB, CKMBINDEX, TROPONINI in the last 168 hours. BNP (last 3 results) No results for input(s): PROBNP in  the last 8760 hours. HbA1C: No results for input(s): HGBA1C in the last 72 hours. CBG:  Recent Labs Lab 03/17/17 1205 03/17/17 1720 03/17/17 2140 03/18/17 0757 03/18/17 1209  GLUCAP 190* 191* 194* 122* 139*   Lipid Profile: No results for input(s): CHOL, HDL, LDLCALC, TRIG, CHOLHDL, LDLDIRECT in the last 72 hours. Thyroid Function Tests: No results for input(s): TSH, T4TOTAL, FREET4, T3FREE, THYROIDAB in the last 72 hours. Anemia Panel: No results for input(s): VITAMINB12, FOLATE, FERRITIN, TIBC, IRON, RETICCTPCT in the last 72 hours. Sepsis Labs: No results for input(s): PROCALCITON, LATICACIDVEN in the last 168 hours.  Recent Results (from the past 240 hour(s))  Culture, blood (Routine X 2) w Reflex to ID Panel     Status: None   Collection Time: 03/10/17 12:23 PM  Result Value Ref Range Status   Specimen  Description BLOOD RIGHT ANTECUBITAL  Final   Special Requests IN PEDIATRIC BOTTLE Blood Culture adequate volume  Final   Culture NO GROWTH 5 DAYS  Final   Report Status 03/15/2017 FINAL  Final  Culture, blood (Routine X 2) w Reflex to ID Panel     Status: None   Collection Time: 03/10/17 12:31 PM  Result Value Ref Range Status   Specimen Description BLOOD RIGHT ANTECUBITAL  Final   Special Requests IN PEDIATRIC BOTTLE Blood Culture adequate volume  Final   Culture NO GROWTH 5 DAYS  Final   Report Status 03/15/2017 FINAL  Final  Surgical pcr screen     Status: None   Collection Time: 03/11/17 10:02 PM  Result Value Ref Range Status   MRSA, PCR NEGATIVE NEGATIVE Final   Staphylococcus aureus NEGATIVE NEGATIVE Final    Comment: (NOTE) The Xpert SA Assay (FDA approved for NASAL specimens in patients 46 years of age and older), is one component of a comprehensive surveillance program. It is not intended to diagnose infection nor to guide or monitor treatment.   Aerobic/Anaerobic Culture (surgical/deep wound)     Status: None   Collection Time: 03/12/17  5:25 PM  Result Value Ref Range Status   Specimen Description ABSCESS RIGHT ANKLE  Final   Special Requests NONE  Final   Gram Stain   Final    MODERATE WBC PRESENT,BOTH PMN AND MONONUCLEAR NO ORGANISMS SEEN    Culture   Final    RARE STAPHYLOCOCCUS AUREUS NO ANAEROBES ISOLATED    Report Status 03/17/2017 FINAL  Final   Organism ID, Bacteria STAPHYLOCOCCUS AUREUS  Final      Susceptibility   Staphylococcus aureus - MIC*    CIPROFLOXACIN <=0.5 SENSITIVE Sensitive     ERYTHROMYCIN <=0.25 SENSITIVE Sensitive     GENTAMICIN <=0.5 SENSITIVE Sensitive     OXACILLIN 0.5 SENSITIVE Sensitive     TETRACYCLINE <=1 SENSITIVE Sensitive     VANCOMYCIN <=0.5 SENSITIVE Sensitive     TRIMETH/SULFA <=10 SENSITIVE Sensitive     CLINDAMYCIN <=0.25 SENSITIVE Sensitive     RIFAMPIN <=0.5 SENSITIVE Sensitive     Inducible Clindamycin NEGATIVE  Sensitive     * RARE STAPHYLOCOCCUS AUREUS         Radiology Studies: No results found.      Scheduled Meds: . aspirin EC  81 mg Oral Daily  . enoxaparin (LOVENOX) injection  40 mg Subcutaneous Daily  . ferrous sulfate  325 mg Oral TID WC  . gabapentin  1,800 mg Oral TID  . insulin aspart  0-15 Units Subcutaneous TID WC  . insulin aspart  0-5 Units Subcutaneous QHS  .  insulin aspart  10 Units Subcutaneous TID WC  . insulin glargine  80 Units Subcutaneous QHS  . PARoxetine  20 mg Oral Daily  . povidone-iodine   Topical Once  . rifampin  300 mg Oral Q12H  . sodium chloride flush  3 mL Intravenous Q12H  . tamsulosin  0.4 mg Oral Daily   Continuous Infusions: . sodium chloride    .  ceFAZolin (ANCEF) IV 2 g (03/18/17 1517)     LOS: 8 days    Time spent: 35 mins    Audie Stayer, MD [facility] Pager [phone]  If 7PM-7AM, please contact night-coverage [email or web address] Password [email or web address] 03/18/2017, 4:13 PM

## 2017-03-19 ENCOUNTER — Telehealth: Payer: Self-pay | Admitting: *Deleted

## 2017-03-19 ENCOUNTER — Inpatient Hospital Stay (HOSPITAL_COMMUNITY): Payer: BC Managed Care – PPO | Admitting: Certified Registered Nurse Anesthetist

## 2017-03-19 ENCOUNTER — Encounter (HOSPITAL_COMMUNITY): Payer: Self-pay | Admitting: Orthopedic Surgery

## 2017-03-19 ENCOUNTER — Encounter (HOSPITAL_COMMUNITY)
Admission: AD | Disposition: A | Discharge status: Home / Self Care | Payer: Self-pay | Source: Ambulatory Visit | Attending: Internal Medicine

## 2017-03-19 DIAGNOSIS — E785 Hyperlipidemia, unspecified: Secondary | ICD-10-CM

## 2017-03-19 DIAGNOSIS — N179 Acute kidney failure, unspecified: Secondary | ICD-10-CM

## 2017-03-19 DIAGNOSIS — M86171 Other acute osteomyelitis, right ankle and foot: Secondary | ICD-10-CM

## 2017-03-19 HISTORY — PX: IRRIGATION AND DEBRIDEMENT FOOT: SHX6602

## 2017-03-19 HISTORY — PX: BONE BIOPSY: SHX375

## 2017-03-19 LAB — BASIC METABOLIC PANEL
ANION GAP: 8 (ref 5–15)
BUN: 26 mg/dL — ABNORMAL HIGH (ref 6–20)
CALCIUM: 9.6 mg/dL (ref 8.9–10.3)
CO2: 27 mmol/L (ref 22–32)
CREATININE: 0.87 mg/dL (ref 0.61–1.24)
Chloride: 101 mmol/L (ref 101–111)
GFR calc Af Amer: >60 mL/min (ref >60)
GFR calc non Af Amer: >60 mL/min (ref >60)
GLUCOSE: 147 mg/dL — AB (ref 65–99)
Potassium: 4.3 mmol/L (ref 3.5–5.1)
Sodium: 136 mmol/L (ref 135–145)

## 2017-03-19 LAB — CBC
HEMATOCRIT: 32.2 % — AB (ref 39.0–52.0)
Hemoglobin: 10 g/dL — ABNORMAL LOW (ref 13.0–17.0)
MCH: 25.3 pg — ABNORMAL LOW (ref 26.0–34.0)
MCHC: 31.1 g/dL (ref 30.0–36.0)
MCV: 81.3 fL (ref 78.0–100.0)
Platelets: 410 10*3/uL — ABNORMAL HIGH (ref 150–400)
RBC: 3.96 MIL/uL — ABNORMAL LOW (ref 4.22–5.81)
RDW: 17.1 % — AB (ref 11.5–15.5)
WBC: 6.3 10*3/uL (ref 4.0–10.5)

## 2017-03-19 LAB — GLUCOSE, CAPILLARY
GLUCOSE-CAPILLARY: 132 mg/dL — AB (ref 65–99)
Glucose-Capillary: 123 mg/dL — ABNORMAL HIGH (ref 65–99)
Glucose-Capillary: 120 mg/dL — ABNORMAL HIGH (ref 65–99)
Glucose-Capillary: 113 mg/dL — ABNORMAL HIGH (ref 65–99)
Glucose-Capillary: 195 mg/dL — ABNORMAL HIGH (ref 65–99)

## 2017-03-19 SURGERY — IRRIGATION AND DEBRIDEMENT FOOT
Anesthesia: General | Site: Foot | Laterality: Right

## 2017-03-19 MED ORDER — HYDROMORPHONE HCL 1 MG/ML IJ SOLN
0.2500 mg | INTRAMUSCULAR | Status: DC | PRN
  Administered 2017-03-19 (×2): 0.5 mg via INTRAVENOUS

## 2017-03-19 MED ORDER — PROMETHAZINE HCL 25 MG/ML IJ SOLN: 6.2500 mg | INTRAMUSCULAR | Status: DC | PRN

## 2017-03-19 MED ORDER — SODIUM CHLORIDE 0.9 % IV SOLN: 250.0000 mL | INTRAVENOUS | Status: DC | PRN

## 2017-03-19 MED ORDER — ONDANSETRON HCL 4 MG/2ML IJ SOLN
INTRAMUSCULAR | Status: DC | PRN
  Administered 2017-03-19: 4 mg via INTRAVENOUS

## 2017-03-19 MED ORDER — MIDAZOLAM HCL 5 MG/5ML IJ SOLN
INTRAMUSCULAR | Status: DC | PRN
  Administered 2017-03-19: 2 mg via INTRAVENOUS

## 2017-03-19 MED ORDER — LACTATED RINGERS IV SOLN
INTRAVENOUS | Status: DC
  Administered 2017-03-19: 17:00:00 via INTRAVENOUS

## 2017-03-19 MED ORDER — LIDOCAINE HCL (CARDIAC) 20 MG/ML IV SOLN
INTRAVENOUS | Status: DC | PRN
  Administered 2017-03-19: 40 mg via INTRAVENOUS

## 2017-03-19 MED ORDER — MIDAZOLAM HCL 2 MG/2ML IJ SOLN: 0.5000 mg | Freq: Once | INTRAMUSCULAR | Status: DC | PRN

## 2017-03-19 MED ORDER — FENTANYL CITRATE (PF) 100 MCG/2ML IJ SOLN
INTRAMUSCULAR | Status: DC | PRN
  Administered 2017-03-19: 50 ug via INTRAVENOUS

## 2017-03-19 MED ORDER — MEPERIDINE HCL 25 MG/ML IJ SOLN: 6.2500 mg | INTRAMUSCULAR | Status: DC | PRN

## 2017-03-19 MED ORDER — PHENYLEPHRINE HCL 10 MG/ML IJ SOLN
INTRAMUSCULAR | Status: DC | PRN
  Administered 2017-03-19: 120 ug via INTRAVENOUS
  Administered 2017-03-19: 160 ug via INTRAVENOUS

## 2017-03-19 MED ORDER — ONDANSETRON HCL 4 MG/2ML IJ SOLN
INTRAMUSCULAR | Status: AC
  Filled 2017-03-19: qty 2

## 2017-03-19 MED ORDER — HYDROMORPHONE HCL 1 MG/ML IJ SOLN
INTRAMUSCULAR | Status: AC
  Filled 2017-03-19: qty 1

## 2017-03-19 MED ORDER — TRAZODONE HCL 50 MG PO TABS: 25.0000 mg | ORAL_TABLET | Freq: Every evening | ORAL | Status: DC | PRN

## 2017-03-19 MED ORDER — MIDAZOLAM HCL 2 MG/2ML IJ SOLN
INTRAMUSCULAR | Status: AC
  Filled 2017-03-19: qty 2

## 2017-03-19 MED ORDER — SODIUM CHLORIDE 0.9 % IR SOLN
Status: DC | PRN
  Administered 2017-03-19: 1

## 2017-03-19 MED ORDER — SODIUM CHLORIDE 0.9% FLUSH: 3.0000 mL | INTRAVENOUS | Status: DC | PRN

## 2017-03-19 MED ORDER — SODIUM CHLORIDE 0.9% FLUSH
3.0000 mL | Freq: Two times a day (BID) | INTRAVENOUS | Status: DC
  Administered 2017-03-19 – 2017-03-20 (×3): 3 mL via INTRAVENOUS

## 2017-03-19 MED ORDER — FENTANYL CITRATE (PF) 250 MCG/5ML IJ SOLN
INTRAMUSCULAR | Status: AC
  Filled 2017-03-19: qty 5

## 2017-03-19 MED ORDER — FLUCONAZOLE 150 MG PO TABS
150.0000 mg | ORAL_TABLET | Freq: Once | ORAL | Status: DC
  Filled 2017-03-19: qty 1

## 2017-03-19 MED ORDER — CEFAZOLIN SODIUM 1 G IJ SOLR
INTRAMUSCULAR | Status: AC
  Filled 2017-03-19: qty 20

## 2017-03-19 MED ORDER — PROPOFOL 10 MG/ML IV BOLUS
INTRAVENOUS | Status: DC | PRN
  Administered 2017-03-19: 200 mg via INTRAVENOUS

## 2017-03-19 MED ORDER — LIDOCAINE 2% (20 MG/ML) 5 ML SYRINGE
INTRAMUSCULAR | Status: AC
  Filled 2017-03-19: qty 5

## 2017-03-19 MED ORDER — CLOTRIMAZOLE 1 % EX CREA
TOPICAL_CREAM | Freq: Two times a day (BID) | CUTANEOUS | Status: DC
  Administered 2017-03-19 – 2017-03-20 (×2): via TOPICAL
  Filled 2017-03-19 (×2): qty 15

## 2017-03-19 MED ORDER — PROPOFOL 10 MG/ML IV BOLUS
INTRAVENOUS | Status: AC
  Filled 2017-03-19: qty 20

## 2017-03-19 SURGICAL SUPPLY — 51 items
BANDAGE ACE 4X5 VEL STRL LF (GAUZE/BANDAGES/DRESSINGS) ×3 IMPLANT
BLADE SAW SGTL 83.5X18.5 (BLADE) ×3 IMPLANT
BLADE SURG 15 STRL LF DISP TIS (BLADE) ×3 IMPLANT
BLADE SURG 15 STRL SS (BLADE) ×6
BNDG COHESIVE 6X5 TAN STRL LF (GAUZE/BANDAGES/DRESSINGS) IMPLANT
BNDG ESMARK 4X9 LF (GAUZE/BANDAGES/DRESSINGS) ×3 IMPLANT
BNDG GAUZE ELAST 4 BULKY (GAUZE/BANDAGES/DRESSINGS) ×3 IMPLANT
BOWL SMART MIX CTS (DISPOSABLE) IMPLANT
CHLORAPREP W/TINT 26ML (MISCELLANEOUS) ×3 IMPLANT
COVER SURGICAL LIGHT HANDLE (MISCELLANEOUS) ×3 IMPLANT
CUFF TOURNIQUET SINGLE 18IN (TOURNIQUET CUFF) ×3 IMPLANT
CUFF TOURNIQUET SINGLE 34IN LL (TOURNIQUET CUFF) ×3 IMPLANT
DRAPE C-ARM MINI 42X72 WSTRAPS (DRAPES) ×3 IMPLANT
DRAPE U-SHAPE 47X51 STRL (DRAPES) ×3 IMPLANT
DRSG PAD ABDOMINAL 8X10 ST (GAUZE/BANDAGES/DRESSINGS) ×3 IMPLANT
ELECT CAUTERY BLADE 6.4 (BLADE) ×3 IMPLANT
ELECT REM PT RETURN 9FT ADLT (ELECTROSURGICAL)
ELECTRODE REM PT RTRN 9FT ADLT (ELECTROSURGICAL) IMPLANT
GAUZE SPONGE 4X4 12PLY STRL (GAUZE/BANDAGES/DRESSINGS) ×3 IMPLANT
GAUZE XEROFORM 1X8 LF (GAUZE/BANDAGES/DRESSINGS) ×3 IMPLANT
GLOVE BIO SURGEON STRL SZ8 (GLOVE) ×3 IMPLANT
GLOVE BIOGEL PI IND STRL 8 (GLOVE) ×1 IMPLANT
GLOVE BIOGEL PI INDICATOR 8 (GLOVE) ×2
GOWN STRL REUS W/ TWL LRG LVL3 (GOWN DISPOSABLE) ×2 IMPLANT
GOWN STRL REUS W/TWL LRG LVL3 (GOWN DISPOSABLE) ×4
HANDPIECE INTERPULSE COAX TIP (DISPOSABLE)
KIT BASIN OR (CUSTOM PROCEDURE TRAY) ×3 IMPLANT
KIT ROOM TURNOVER OR (KITS) ×3 IMPLANT
MANIFOLD NEPTUNE II (INSTRUMENTS) ×3 IMPLANT
NEEDLE HYPO 25GX1X1/2 BEV (NEEDLE) ×3 IMPLANT
NEEDLE RANFAC BLUNT 8X15 (NEEDLE) ×3 IMPLANT
NS IRRIG 1000ML POUR BTL (IV SOLUTION) ×3 IMPLANT
PACK ORTHO EXTREMITY (CUSTOM PROCEDURE TRAY) ×3 IMPLANT
PAD ARMBOARD 7.5X6 YLW CONV (MISCELLANEOUS) ×6 IMPLANT
PAD CAST 4YDX4 CTTN HI CHSV (CAST SUPPLIES) ×1 IMPLANT
PADDING CAST COTTON 4X4 STRL (CAST SUPPLIES) ×2
PADDING CAST COTTON 6X4 STRL (CAST SUPPLIES) ×3 IMPLANT
SCRUB BETADINE 4OZ XXX (MISCELLANEOUS) ×3 IMPLANT
SET HNDPC FAN SPRY TIP SCT (DISPOSABLE) IMPLANT
SOL PREP POV-IOD 4OZ 10% (MISCELLANEOUS) ×3 IMPLANT
STAPLER VISISTAT 35W (STAPLE) ×3 IMPLANT
STOCKINETTE IMPERVIOUS 9X36 MD (GAUZE/BANDAGES/DRESSINGS) ×3 IMPLANT
SUT PROLENE 3 0 PS 2 (SUTURE) ×3 IMPLANT
SWAB CULTURE LIQ STUART DBL (MISCELLANEOUS) ×3 IMPLANT
SWAB CULTURE LIQUID MINI MALE (MISCELLANEOUS) ×3 IMPLANT
SYR CONTROL 10ML LL (SYRINGE) ×3 IMPLANT
TOWEL OR 17X24 6PK STRL BLUE (TOWEL DISPOSABLE) ×3 IMPLANT
TOWEL OR 17X26 10 PK STRL BLUE (TOWEL DISPOSABLE) ×3 IMPLANT
TUBE CONNECTING 12'X1/4 (SUCTIONS) ×1
TUBE CONNECTING 12X1/4 (SUCTIONS) ×2 IMPLANT
YANKAUER SUCT BULB TIP NO VENT (SUCTIONS) ×3 IMPLANT

## 2017-03-19 NOTE — Anesthesia Procedure Notes (Signed)
Procedure Name: LMA Insertion Date/Time: 03/19/2017 6:12 PM Performed by: Rosiland OzMEYERS, [name] Pre-anesthesia Checklist: Patient identified, Emergency Drugs available, Suction available, Patient being monitored and Timeout performed Patient Re-evaluated:Patient Re-evaluated prior to induction Oxygen Delivery Method: Circle system utilized Preoxygenation: Pre-oxygenation with 100% oxygen Induction Type: IV induction LMA: LMA inserted LMA Size: 4.0 Number of attempts: 1 Placement Confirmation: positive ETCO2 and breath sounds checked- equal and bilateral Tube secured with: Tape Dental Injury: Teeth and Oropharynx as per pre-operative assessment

## 2017-03-19 NOTE — Progress Notes (Signed)
[place] for Infectious Disease  Date of Admission:  03/10/2017     2Total days of antibiotics 7 Cefazolin, 1 Rifampin   ASSESSMENT: Right ankle infection with septic arthritis, osteomyelitis and large medial abscess.   PLAN: 1. Surgery rescheduled this evening for debridement and vac placement 2. Continue Cefazolin + Rifampin for 8 weeks  3. D/C planning - OPAT consult entered and orders as dictated below, discussed with [facility]. PICC in place 4. Reinforced NWB status after surgery to allow for offloading and no further trauma to bones 5. Will arrange for outpatient follow up with Dr. Megan Salon in 4 weeks. Plan for outpatient MRI to re-evaluate prior to stopping IV abx.    OPAT ORDERS:  Diagnosis: Right ankle infection with septic arthritis, osteomyelitis and large medial abscess.  Culture Result: MSSA  Allergies  Allergen Reactions  . Daptomycin Anaphylaxis and Shortness Of Breath    Breathing issues  . Lisinopril Other (See Comments)    Cough    Discharge antibiotics: Cefazolin 2 gm IV q8h   Duration: 8 weeks  End Date: May 15, 2017  [facility] Care and Maintenance Per Protocol __ Please pull PIC at completion of IV antibiotics _X_ Please leave PIC in place until doctor has seen patient or been notified  Labs weekly while on IV antibiotics: _X_ CBC with differential __ BMP __ BMP TWICE WEEKLY** _X_ CMP _X_ CRP _X_ ESR __ Vancomycin trough  Fax weekly labs to [phone]  Clinic Follow Up Appt: 4 weeks   @ [facility] with Dr. Lurlean Leyden, MSN, NP-C [place] for Infectious Disease [place] Pager: [phone]  03/19/2017 9:54 AM     Principal Problem:   Osteomyelitis of ankle, right, acute ([place]) Active Problems:   Septic arthritis of ankle or foot ([place])   Type II diabetes mellitus ([place])   HTN (hypertension)   BPH (benign prostatic hyperplasia)   Diabetic polyneuropathy associated with type  1 diabetes mellitus ([place])   Hypercholesterolemia   Hypertension   Acute kidney injury ([place])   Acute hyperkalemia   Anemia, chronic disease   . aspirin EC  81 mg Oral Daily  . enoxaparin (LOVENOX) injection  40 mg Subcutaneous Daily  . ferrous sulfate  325 mg Oral TID WC  . gabapentin  1,800 mg Oral TID  . insulin aspart  0-15 Units Subcutaneous TID WC  . insulin aspart  0-5 Units Subcutaneous QHS  . insulin aspart  10 Units Subcutaneous TID WC  . insulin glargine  80 Units Subcutaneous QHS  . PARoxetine  20 mg Oral Daily  . povidone-iodine   Topical Once  . rifampin  300 mg Oral Q12H  . sodium chloride flush  3 mL Intravenous Q12H  . tamsulosin  0.4 mg Oral Daily    SUBJECTIVE: Interval History - upset at surgery rescheduling today. Ready to go home to recover so he can be with his wife and children. Not feeling well this morning being NPO taking medications and insulin. No F/C. No vomiting.   Review of Systems: Review of Systems  Constitutional: Negative for chills and fever.  Respiratory: Negative for cough and shortness of breath.   Cardiovascular: Negative for chest pain and orthopnea.  Gastrointestinal: Positive for nausea. Negative for diarrhea and vomiting.  Musculoskeletal: Positive for joint pain.  Skin: Negative for itching and rash.  Neurological: Negative for headaches.    Allergies  Allergen Reactions  . Daptomycin Anaphylaxis  and Shortness Of Breath    Breathing issues  . Lisinopril Other (See Comments)    Cough    OBJECTIVE: Vitals:   03/18/17 1611 03/18/17 2000 03/18/17 2119 03/19/17 0402  BP: 118/71 (!) 144/78 120/75 118/70  Pulse: 84 98 96 79  Resp: _0 Temp: 97.8 F (36.6 C) 98.1 F (36.7 C) 98.5 F (36.9 C) 97.9 F (36.6 C)  TempSrc: Oral Axillary Oral Oral  SpO2: 98% 98% 100% 97%  Weight:      Height:       Body mass index is 31.52 kg/m.  Physical Exam  Constitutional: He is oriented to person, place, and time and  well-developed, well-nourished, and in no distress.  Lying in his bed under covers today. Not feeling well taking rifampin on empty stomach and concerned about blood sugars today being NPO.   Cardiovascular: Normal rate, regular rhythm and intact distal pulses.   Pulmonary/Chest: Effort normal and breath sounds normal.  Abdominal: Soft. Bowel sounds are normal. He exhibits no distension.  Musculoskeletal:  RLE wrapped with kerlix. Dressing changed and clean and dry. Lots of drainage reported yesterday.   Neurological: He is alert and oriented to person, place, and time.  Skin: Skin is warm and dry.    Lab Results Lab Results  Component Value Date   WBC 6.3 03/19/2017   HGB 10.0 (L) 03/19/2017   HCT 32.2 (L) 03/19/2017   MCV 81.3 03/19/2017   PLT 410 (H) 03/19/2017    Lab Results  Component Value Date   CREATININE 0.87 03/19/2017   BUN 26 (H) 03/19/2017   NA 136 03/19/2017   K 4.3 03/19/2017   CL 101 03/19/2017   CO2 27 03/19/2017    Lab Results  Component Value Date   ALT 40 03/18/2017   AST 33 03/18/2017   ALKPHOS 58 03/18/2017   BILITOT 0.5 03/18/2017     Microbiology: Recent Results (from the past 240 hour(s))  Culture, blood (Routine X 2) w Reflex to ID Panel     Status: None   Collection Time: 03/10/17 12:23 PM  Result Value Ref Range Status   Specimen Description BLOOD RIGHT ANTECUBITAL  Final   Special Requests IN PEDIATRIC BOTTLE Blood Culture adequate volume  Final   Culture NO GROWTH 5 DAYS  Final   Report Status 03/15/2017 FINAL  Final  Culture, blood (Routine X 2) w Reflex to ID Panel     Status: None   Collection Time: 03/10/17 12:31 PM  Result Value Ref Range Status   Specimen Description BLOOD RIGHT ANTECUBITAL  Final   Special Requests IN PEDIATRIC BOTTLE Blood Culture adequate volume  Final   Culture NO GROWTH 5 DAYS  Final   Report Status 03/15/2017 FINAL  Final  Surgical pcr screen     Status: None   Collection Time: 03/11/17 10:02 PM  Result  Value Ref Range Status   MRSA, PCR NEGATIVE NEGATIVE Final   Staphylococcus aureus NEGATIVE NEGATIVE Final    Comment: (NOTE) The Xpert SA Assay (FDA approved for NASAL specimens in patients 5 years of age and older), is one component of a comprehensive surveillance program. It is not intended to diagnose infection nor to guide or monitor treatment.   Aerobic/Anaerobic Culture (surgical/deep wound)     Status: None   Collection Time: 03/12/17  5:25 PM  Result Value Ref Range Status   Specimen Description ABSCESS RIGHT ANKLE  Final   Special Requests NONE  Final  Gram Stain   Final    MODERATE WBC PRESENT,BOTH PMN AND MONONUCLEAR NO ORGANISMS SEEN    Culture   Final    RARE STAPHYLOCOCCUS AUREUS NO ANAEROBES ISOLATED    Report Status 03/17/2017 FINAL  Final   Organism ID, Bacteria STAPHYLOCOCCUS AUREUS  Final      Susceptibility   Staphylococcus aureus - MIC*    CIPROFLOXACIN <=0.5 SENSITIVE Sensitive     ERYTHROMYCIN <=0.25 SENSITIVE Sensitive     GENTAMICIN <=0.5 SENSITIVE Sensitive     OXACILLIN 0.5 SENSITIVE Sensitive     TETRACYCLINE <=1 SENSITIVE Sensitive     VANCOMYCIN <=0.5 SENSITIVE Sensitive     TRIMETH/SULFA <=10 SENSITIVE Sensitive     CLINDAMYCIN <=0.25 SENSITIVE Sensitive     RIFAMPIN <=0.5 SENSITIVE Sensitive     Inducible Clindamycin NEGATIVE Sensitive     * RARE STAPHYLOCOCCUS AUREUS

## 2017-03-19 NOTE — Care Management Note (Addendum)
Case Management Note  Patient Details  Name: Keith [name] MRN: [id number] Date of Birth: 09/30/1970  Subjective/Objective:               Admitted with Right ankle  MSSA infection with septic arthritis, osteomyelitis and large medial abscess. Resides with wife.   Sharee HolsterCourtney Coryell (Spouse)     [phone]       [facility]  Action/Plan:  Pt scheduled for today ( 03/19/2017) irrigation and debridement of the right foot and ankle with application of negative pressure wound VAC.   Referral made with [facility] for wound Vac for home.  Pt will also  d/c to home with home health services: IV ABX infusion when medically stable.  Expected Discharge Date:   03/19/2017           Expected Discharge Plan:  Home w Home Health Services  In-House Referral:     Discharge planning Services  CM Consult  Post Acute Care Choice:    Choice offered to:  Patient  DME Arranged:  IV pump/equipment, Walker rolling, Wound Vac DME Agency:  [facility]  [facility]  Status of Service:  Completed, signed off  If discussed at Long Length of Stay Meetings, dates discussed:    Additional Comments:  Epifanio LeschesCole, Herbert Marken Hudson, RN 03/19/2017, 2:45 PM

## 2017-03-19 NOTE — Progress Notes (Signed)
PHARMACY CONSULT NOTE FOR:  OUTPATIENT  PARENTERAL ANTIBIOTIC THERAPY (OPAT)  Indication: Right ankle infection with septic arthritis, osteomyelitis and large medial abscess Regimen: Cefazolin 2 gm every 8 hours + Rifampin 300 mg every 12 hours End date: 05/15/2017  IV antibiotic discharge orders are pended. To discharging provider:  please sign these orders via discharge navigator,  Select New Orders & click on the button choice - Manage This Unsigned Work.     Thank you for allowing pharmacy to be a part of this patient's care.  Della GooEmily S Sinclair, PharmD PGY2 Infectious Diseases Pharmacy Resident Pager: [phone] 03/19/2017, 10:12 AM

## 2017-03-19 NOTE — Brief Op Note (Signed)
03/10/2017 - 03/19/2017  7:17 PM  PATIENT:  Keith [name]  46 y.o. male  PRE-OPERATIVE DIAGNOSIS:  right septic ankle  POST-OPERATIVE DIAGNOSIS:  right septic ankle  PROCEDURE:  Procedure(s): IRRIGATION AND DEBRIDEMENT RIGHT SEPTIC ANKLE (Right) BONE BIOPSY (Right)  SURGEON:  Surgeon(s) and Role:    Felecia Shelling* Amish Mintzer M, DPM - Primary  PHYSICIAN ASSISTANT:   ASSISTANTS: none   ANESTHESIA:   general  EBL:  No intake/output data recorded.  BLOOD ADMINISTERED:none  DRAINS: none   LOCAL MEDICATIONS USED:  NONE  SPECIMEN:  Source of Specimen:  2 seperate bone biopsies: talus. medial malleolus.   DISPOSITION OF SPECIMEN:  PATHOLOGY  COUNTS:  YES  TOURNIQUET:   Total Tourniquet Time Documented: Calf (Right) - 27 minutes Total: Calf (Right) - 27 minutes   DICTATION: .Reubin Milanragon Dictation  PLAN OF CARE: Patient currently inpatient  PATIENT DISPOSITION:  PACU - hemodynamically stable.   Delay start of Pharmacological VTE agent (>24hrs) due to surgical blood loss or risk of bleeding: not applicable  Felecia ShellingBrent M. Gregor Dershem, DPM [facility]  Dr. Felecia ShellingBrent M. Adalyna Godbee, DPM    [address]                                    [place], [place] [address]                Office [phone]  Fax [phone]

## 2017-03-19 NOTE — Anesthesia Postprocedure Evaluation (Signed)
Anesthesia Post Note  Patient: Keith [name]  Procedure(s) Performed: Procedure(s) (LRB): IRRIGATION AND DEBRIDEMENT RIGHT SEPTIC ANKLE (Right) BONE BIOPSY (Right)     Patient location during evaluation: PACU Anesthesia Type: General Level of consciousness: awake Pain management: pain level controlled Vital Signs Assessment: post-procedure vital signs reviewed and stable Respiratory status: spontaneous breathing Cardiovascular status: stable Anesthetic complications: no    Last Vitals:  Vitals:   03/19/17 2000 03/19/17 2018  BP: 113/76 114/67  Pulse: 79 86  Resp: 12 18  Temp: (!) 36.2 C 36.8 C  SpO2: 97% 98%    Last Pain:  Vitals:   03/19/17 2018  TempSrc: Oral  PainSc: 4                  Guenevere Roorda

## 2017-03-19 NOTE — Telephone Encounter (Signed)
"  He's scheduled for surgery this afternoon.  We are supposed to set up home care.  Is it okay for us to send the referral to [facility]?"  Yes, that is fine.

## 2017-03-19 NOTE — Progress Notes (Signed)
PROGRESS NOTE    Keith [name]  ZOX:[id number] DOB: Aug 22, 1970 DOA: 03/10/2017 PCP: [facility]    Brief Narrative:  Keith E Wrightis a 46 y.o.malewith medical history significant for diabetes mellitus 2 on insulin, obesity, BPH on Flomax, hypertension, dyslipidemia, peripheral neuropathy, and septic arthritis of right ankle. [facility]. At that time there was no open wound. Podiatrist recommended to discontinue antibiotics given during the hospitalization. He had previously been treated with Bactrim for 2 weeks. Unfortunately the ankle became worse and hedeveloped an open wound.He also underwent intraoperative I&D on 6/14 with cultures being positive for MSSA and he was started on amoxicillin. He was evaluated on 6/29 by ID and changedto IV cefazolin viaPICC line with this drug actually initiated on 7/3. He completed 6 weeks of antibiotics on 8/14. Shortly after completing these antibiotics he noticed increased swelling and drainage from the ankle and increasing severe shooting type pains. He followed up with his podiatrist on 8/20 and wound cultures again were positive for MSSA. He was started on Bactrim. Oral ciprofloxacin was added on 8/22. Today he followed up with the ID clinic/Dr. Orvan Falconer complaining of extreme weakness, no appetite, andweight loss of 20 pounds. Clinically he appeared pale and was tachycardic with SBPin the 90s. He was sent directly from the office to the hospital for admission. Of note the ankle wound which had nearly healed from the previous surgery and antibiotic treatment has now opened and is at least 3 x 4 cm.   Assessment & Plan:   Principal Problem:   Osteomyelitis of ankle, right, acute ([facility]) Active Problems:   Septic arthritis of ankle or foot ([facility])   Type II diabetes mellitus ([facility])   HTN (hypertension)   BPH (benign prostatic hyperplasia)   Diabetic polyneuropathy associated with type 1  diabetes mellitus ([facility])   Hypercholesterolemia   Hypertension   Acute kidney injury ([facility])   Acute hyperkalemia   Anemia, chronic disease  #1 septic arthritis of the right foot/osteomyelitis of the right ankle and large medial abscess Patient presented with recurrent MSSA infection of the right ankle joint which had worsened with abscess formation after recent completion of IV antibiotics. MRI of the right foot consistent with osteomyelitis involving all bones of the hindfoot, midfoot, base of second through fifth metatarsals,talar, subtalar joint with large loculated medial abscess and lateral abscess communicating with tibiotalar joint, 6 cm. -Patient status post I and D per podiatry, Dr. Logan Bores 03/13/2017. Patient noted to be at increased risk for BKA. Long-term IV antibiotics recommended per ID unless patient undergoes BKA. - Wound cultures consistent with MSSA. - Continue rifampin and IV Ancef per ID recommendations. - Patient for repeat I and D and application of negative pressure wound VAC per podiatry today 03/19/2017. - Antibiotics management per ID.  #2 anemia Likely secondary to postop blood loss versus hemodilution. Hemoglobin has remained stable. Patient status post transfusion of 2 units packed red blood cells.   #3 well-controlled type 2 diabetes mellitus Hemoglobin A1c 6.2. CBGs ranging from 120-132. Continue Lantus and sliding scale insulin.  #4 hypertension Blood pressure stable. Continue to hold antihypertensive medications. Follow.  #5 acute kidney injury/hyperkalemia/metabolic acidosis Secondary to prerenal azotemia secondary to dehydration and hypoperfusion, NSAIDs and ARB. Renal function improved. Continue to hold nephrotoxic agents.  #6 acute hyperkalemia Resolved.  #7 hyperlipidemia Continue home regimen Lipitor.  #8 BPH Continue Flomax.     DVT prophylaxis: Lovenox Code Status: Full Family Communication:  Updated patient. Updated wife via  telephone. Disposition Plan: Home with home health when cleared by podiatry..   Consultants:   Podiatry: Dr. Logan Bores 03/11/2017  Infectious disease: Dr. Orvan Falconer 03/10/2017  Procedures:   MRI right ankle 03/11/2017  Irrigation and debridment / incision and drainage right foot and ankle abscesses--- per Dr. Logan Bores 03/12/2017 2 units packed red blood cells 03/14/2017  Antimicrobials:   Rifampin 03/17/2017  IV Ancef 03/10/2017   Subjective: Patient frustrated that his surgery was canceled without any explanation as he had had to make several arrangements for babysitting for his wife to be able here during surgery. Patient awaiting I which was rescheduled for today. No chest pain. No shortness of breath. Wife concerned that patient's pharmacy may not have rifampin.   Objective: Vitals:   03/18/17 1611 03/18/17 2000 03/18/17 2119 03/19/17 0402  BP: 118/71 (!) 144/78 120/75 118/70  Pulse: 84 98 96 79  Resp: Temp: 97.8 F (36.6 C) 98.1 F (36.7 C) 98.5 F (36.9 C) 97.9 F (36.6 C)  TempSrc: Oral Axillary Oral Oral  SpO2: 98% 98% 100% 97%  Weight:      Height:        Intake/Output Summary (Last 24 hours) at 03/19/17 1234 Last data filed at 03/19/17 0900  Gross per 24 hour  Intake              540 ml  Output              800 ml  Net             -260 ml   Filed Weights   03/11/17 0612 03/12/17 1610  Weight: 105.4 kg (232 lb 6.4 oz) 105.4 kg (232 lb 6.4 oz)    Examination:  General exam: Appears calm and comfortable. Respiratory system: Lungs clear to auscultation bilaterally. No wheezes, no crackles, no rhonchi. Respiratory effort normal. Cardiovascular system: S1 & S2 heard, RRR. No JVD, murmurs, rubs, gallops or clicks. No pedal edema. Gastrointestinal system: Abdomen is soft, nontender, nondistended, positive bowel sounds. No rebound. No guarding.  Central nervous system: Alert and oriented. No focal neurological deficits. Extremities: Right lower  extremity with Kerlix dressing without any drainage. Skin: No rashes, lesions or ulcers Psychiatry: Judgement and insight appear normal. Mood & affect appropriate.     Data Reviewed: I have personally reviewed following labs and imaging studies  CBC:  Recent Labs Lab 03/15/17 0350 03/16/17 0349 03/17/17 0457 03/18/17 0344 03/19/17 0557  WBC 6.3 6.6 7.7 7.4 6.3  HGB 9.4* 9.6* 9.7* 9.8* 10.0*  HCT 29.4* 31.0* 31.6* 31.6* 32.2*  MCV 79.5 80.7 80.8 80.6 81.3  PLT 349 367 402* 422* 410*   Basic Metabolic Panel:  Recent Labs Lab 03/15/17 0350 03/16/17 0349 03/17/17 0457 03/18/17 0344 03/19/17 0557  NA 135 133* 134* 134* 136  K 4.2 4.4 4.2 4.3 4.3  CL 103 102 101 101 101  CO2 GLUCOSE 240* 241* 152* 168* 147*  BUN 22* 19 23* 25* 26*  CREATININE 0.98 0.85 0.84 0.89 0.87  CALCIUM 9.0 9.3 9.5 9.4 9.6   GFR: Estimated Creatinine Clearance: 133.1 mL/min (by C-G formula based on SCr of 0.87 mg/dL). Liver Function Tests:  Recent Labs Lab 03/18/17 0344  AST 33  ALT 40  ALKPHOS 58  BILITOT 0.5  PROT 7.2  ALBUMIN 2.5*   No results for input(s): LIPASE, AMYLASE in the last 168 hours. No results for input(s): AMMONIA  in the last 168 hours. Coagulation Profile: No results for input(s): INR, PROTIME in the last 168 hours. Cardiac Enzymes: No results for input(s): CKTOTAL, CKMB, CKMBINDEX, TROPONINI in the last 168 hours. BNP (last 3 results) No results for input(s): PROBNP in the last 8760 hours. HbA1C: No results for input(s): HGBA1C in the last 72 hours. CBG:  Recent Labs Lab 03/18/17 1209 03/18/17 1730 03/18/17 2116 03/19/17 0756 03/19/17 1129  GLUCAP 139* 103* 254* 132* 123*   Lipid Profile: No results for input(s): CHOL, HDL, LDLCALC, TRIG, CHOLHDL, LDLDIRECT in the last 72 hours. Thyroid Function Tests: No results for input(s): TSH, T4TOTAL, FREET4, T3FREE, THYROIDAB in the last 72 hours. Anemia Panel: No results for input(s):  VITAMINB12, FOLATE, FERRITIN, TIBC, IRON, RETICCTPCT in the last 72 hours. Sepsis Labs: No results for input(s): PROCALCITON, LATICACIDVEN in the last 168 hours.  Recent Results (from the past 240 hour(s))  Culture, blood (Routine X 2) w Reflex to ID Panel     Status: None   Collection Time: 03/10/17 12:23 PM  Result Value Ref Range Status   Specimen Description BLOOD RIGHT ANTECUBITAL  Final   Special Requests IN PEDIATRIC BOTTLE Blood Culture adequate volume  Final   Culture NO GROWTH 5 DAYS  Final   Report Status 03/15/2017 FINAL  Final  Culture, blood (Routine X 2) w Reflex to ID Panel     Status: None   Collection Time: 03/10/17 12:31 PM  Result Value Ref Range Status   Specimen Description BLOOD RIGHT ANTECUBITAL  Final   Special Requests IN PEDIATRIC BOTTLE Blood Culture adequate volume  Final   Culture NO GROWTH 5 DAYS  Final   Report Status 03/15/2017 FINAL  Final  Surgical pcr screen     Status: None   Collection Time: 03/11/17 10:02 PM  Result Value Ref Range Status   MRSA, PCR NEGATIVE NEGATIVE Final   Staphylococcus aureus NEGATIVE NEGATIVE Final    Comment: (NOTE) The Xpert SA Assay (FDA approved for NASAL specimens in patients 46 years of age and older), is one component of a comprehensive surveillance program. It is not intended to diagnose infection nor to guide or monitor treatment.   Aerobic/Anaerobic Culture (surgical/deep wound)     Status: None   Collection Time: 03/12/17  5:25 PM  Result Value Ref Range Status   Specimen Description ABSCESS RIGHT ANKLE  Final   Special Requests NONE  Final   Gram Stain   Final    MODERATE WBC PRESENT,BOTH PMN AND MONONUCLEAR NO ORGANISMS SEEN    Culture   Final    RARE STAPHYLOCOCCUS AUREUS NO ANAEROBES ISOLATED    Report Status 03/17/2017 FINAL  Final   Organism ID, Bacteria STAPHYLOCOCCUS AUREUS  Final      Susceptibility   Staphylococcus aureus - MIC*    CIPROFLOXACIN <=0.5 SENSITIVE Sensitive     ERYTHROMYCIN  <=0.25 SENSITIVE Sensitive     GENTAMICIN <=0.5 SENSITIVE Sensitive     OXACILLIN 0.5 SENSITIVE Sensitive     TETRACYCLINE <=1 SENSITIVE Sensitive     VANCOMYCIN <=0.5 SENSITIVE Sensitive     TRIMETH/SULFA <=10 SENSITIVE Sensitive     CLINDAMYCIN <=0.25 SENSITIVE Sensitive     RIFAMPIN <=0.5 SENSITIVE Sensitive     Inducible Clindamycin NEGATIVE Sensitive     * RARE STAPHYLOCOCCUS AUREUS         Radiology Studies: No results found.      Scheduled Meds: . aspirin EC  81 mg Oral Daily  . enoxaparin (LOVENOX)  injection  40 mg Subcutaneous Daily  . ferrous sulfate  325 mg Oral TID WC  . gabapentin  1,800 mg Oral TID  . insulin aspart  0-15 Units Subcutaneous TID WC  . insulin aspart  0-5 Units Subcutaneous QHS  . insulin aspart  10 Units Subcutaneous TID WC  . insulin glargine  80 Units Subcutaneous QHS  . PARoxetine  20 mg Oral Daily  . povidone-iodine   Topical Once  . rifampin  300 mg Oral Q12H  . sodium chloride flush  3 mL Intravenous Q12H  . tamsulosin  0.4 mg Oral Daily   Continuous Infusions: . sodium chloride    .  ceFAZolin (ANCEF) IV 2 g (03/19/17 0627)     LOS: 9 days    Time spent: 35 mins    Cherith Tewell, MD [facility] Pager [phone]  If 7PM-7AM, please contact night-coverage [email or web address] Password [facility] 03/19/2017, 12:34 PM

## 2017-03-19 NOTE — Progress Notes (Addendum)
CM received consult: Check patient's pharmacy to see if they carry rifampin for patient on discharge.  [facility]  [facility] [address]  [place], [place] [address] [place] Phone: [phone] Fax# [phone]  CM made pt aware.Pt requesting MD to electronically send script to above location. Gae GallopAngela Fredonia Casalino RN,BSN,CM

## 2017-03-19 NOTE — Transfer of Care (Signed)
Immediate Anesthesia Transfer of Care Note  Patient: Keith [name]  Procedure(s) Performed: Procedure(s): IRRIGATION AND DEBRIDEMENT RIGHT SEPTIC ANKLE (Right) BONE BIOPSY (Right)  Patient Location: PACU  Anesthesia Type:General  Level of Consciousness: awake, alert , oriented and patient cooperative  Airway & Oxygen Therapy: Patient Spontanous Breathing and Patient connected to nasal cannula oxygen  Post-op Assessment: Report given to RN, Post -op Vital signs reviewed and stable and Patient moving all extremities  Post vital signs: Reviewed and stable  Last Vitals:  Vitals:   03/19/17 1339 03/19/17 1918  BP: 107/71   Pulse: 87 85  Resp: 18 (!) 8  Temp: 37.1 C 36.4 C  SpO2: 97% 100%    Last Pain:  Vitals:   03/19/17 1339  TempSrc: Oral  PainSc:       Patients Stated Pain Goal: 4 (03/19/17 0003)  Complications: No apparent anesthesia complications

## 2017-03-19 NOTE — Op Note (Signed)
OPERATIVE REPORT Patient name: Keith [name] MRN: [id number] DOB: [birth year]  DOS:  03/19/2017  Preop Dx: Septic ankle and subtalar joint right. Osteomyelitis right foot Postop Dx: same  Procedure:  1. Irrigation and debridement right ankle with bone biopsy  Surgeon: Felecia Shelling DPM  Anesthesia: General anesthesia  Hemostasis: Calf tourniquet without exsanguination   EBL: 50 mL Materials: none Injectables: none Pathology: bone biopsy medial malleolus. Bone biopsy talus.  Condition: The patient tolerated the procedure and anesthesia well. No complications noted or reported   Justification for procedure: The patient is a 46 y.o. male with a history of DM and degenerative changes to the foot and ankle presents today for surgical correction of septic ankle arthritis with cellulitis to the right lower extremity bilateral. Initial irrigation and debridement surgery was performed on 03/12/2017 at which time a large incisions were made to the medial and lateral aspects of the foot and ankle and heavy amounts of purulent drainage was expressed. The patient has been on IV antibiotic treatments as per infectious disease and dressing changes every other day wet-to-dry. The patient consented for surgical correction. The patient consent form was reviewed. All patient questions were answered. No guarantees were expressed or implied. The patient and the surgeon boson the patient consent form with the witness present and placed in the patient's chart.   Procedure in Detail: The patient was brought to the operating room, placed in the operating table in the supine position at which time an aseptic scrub and drape were performed about the patient's respective lower extremity after anesthesia was induced as described above. The right calf tourniquet was inflated without Esmarch bandage examination and Attention was then directed to the surgical area where procedure number one commenced.  Procedure #1:  Irrigation and debridement right foot and ankle  Excisional debridement and exploration of all compartments of the foot and ankle joints were explored and curettage was performed of all necrotic nonviable tissue. Exploration of the compartments of the foot and ankle was performed through the medial and lateral incision sites made from the previous incision and drainage. There continues to remain a significant portion of necrotic tissue however the purulent drainage hasn't improved greatly. Upon debridement and exploration of all compartments there was only a small area of abscess that was identified and drained. Minimal purulent drainage noted.  Due to the additional amount of purulent drainage and the necrotic tissue within the foot and ankle structures of the foot, the intraoperative decision was made not to apply negative pressure wound VAC therapy at this time.  Intraoperative evaluation of the osseous structures of the foot and ankle were concerning due to degenerative changes. The osseous structures of the foot and ankle appeared to be somewhat necrotic with significant amount of degeneration. Intraoperative decision was made to perform bone biopsies and send to pathology.  Procedure #2: Bone biopsy right foot and ankle Careful dissection and irrigation of the sites was performed using a #15 blade and copious irrigation of the bone biopsy sites was performed to clear all contamination from soft tissue structures. Bone biopsy was achieved using a bone marrow aspirate needle and a cylindrical core bone biopsy approximately 3 mm in diameter and 1 cm in length was obtained from the medial malleolus of the right ankle as well as the talar body right lower extremity. Bone biopsy was placed in sterile specimen container and sent to pathology for microscopic exam.  3 L of normal saline with pulse lavage was utilized for  copious irrigation of all incision sites and compartments of the foot and ankle right  lower extremity.  Dry sterile compressive dressings were then applied to all previously mentioned incision sites about the patient's lower extremity. The tourniquet which was used for hemostasis was deflated. All normal neurovascular responses including pink color and warmth returned all the digits of patient's lower extremity.  The patient was then transferred from the operating room to the recovery room having tolerated the procedure and anesthesia well. All vital signs are stable. After a brief stay in the recovery room the patient was readmitted as inpatient to his room with adequate prescriptions for analgesia. Verbal as well as written instructions were provided for wound care. Resume dressing changes every other day consisting of wet-to-dry dressings.   IMPRESSION: Intraoperative visualization of the osseous structures of the foot and ankle are concerning due to significant amount of degenerative changes and osteolytic process concerning for diffuse osteomyelitis. Bone biopsies were taken today and sent to pathology.  Postoperatively had a very frank discussion with the spouse regarding the possibility of below-knee amputation. At this time below-knee amputation may be a viable option for the patient regarding quality of life and recovery. I Informed the spouse that if the patient is adamant about maintaining and preserving his limb it will be a very long recovery which would likely include long-term wound care, long-term antibiotic use, and long-term nonweightbearing in an immobilization cast or CAM boot. Plan to have the same discussion with the patient on next inpatient visit.  Felecia ShellingBrent M. Yehia Mcbain, DPM [facility]  Dr. Felecia ShellingBrent M. Tayjon Halladay, DPM    [address]                                        [place], [place] [address]                Office [phone]  Fax [phone]

## 2017-03-19 NOTE — Anesthesia Preprocedure Evaluation (Addendum)
Anesthesia Evaluation  Patient identified by MRN, date of birth, ID band Patient awake    Reviewed: Allergy & Precautions, NPO status , Patient's Chart, lab work & pertinent test results  History of Anesthesia Complications Negative for: history of anesthetic complications  Airway Mallampati: II  TM Distance: >3 FB Neck ROM: Full    Dental  (+) Poor Dentition, Chipped, Missing, Loose, Dental Advisory Given   Pulmonary neg pulmonary ROS,    breath sounds clear to auscultation       Cardiovascular hypertension, Pt. on medications + Peripheral Vascular Disease   Rhythm:Regular Rate:Normal     Neuro/Psych negative neurological ROS     GI/Hepatic Neg liver ROS, GERD  Controlled,  Endo/Other  diabetes (glu 123), Insulin Dependent  Renal/GU negative Renal ROS     Musculoskeletal  (+) Arthritis ,   Abdominal (+) + obese,   Peds  Hematology  (+) Blood dyscrasia (Hb 10.0), anemia ,   Anesthesia Other Findings   Reproductive/Obstetrics                            Anesthesia Physical Anesthesia Plan  ASA: III  Anesthesia Plan: General   Post-op Pain Management:    Induction: Intravenous  PONV Risk Score and Plan: 2 and Ondansetron and Dexamethasone  Airway Management Planned: LMA  Additional Equipment:   Intra-op Plan:   Post-operative Plan:   Informed Consent: I have reviewed the patients History and Physical, chart, labs and discussed the procedure including the risks, benefits and alternatives for the proposed anesthesia with the patient or authorized representative who has indicated his/her understanding and acceptance.   Dental advisory given  Plan Discussed with: CRNA and Surgeon  Anesthesia Plan Comments: (Plan routine monitors, GA- LMA OK)        Anesthesia Quick Evaluation

## 2017-03-20 ENCOUNTER — Encounter (HOSPITAL_COMMUNITY): Payer: Self-pay | Admitting: Podiatry

## 2017-03-20 LAB — BASIC METABOLIC PANEL WITH GFR
Anion gap: 8 (ref 5–15)
BUN: 24 mg/dL — ABNORMAL HIGH (ref 6–20)
CO2: 26 mmol/L (ref 22–32)
Calcium: 9.2 mg/dL (ref 8.9–10.3)
Chloride: 101 mmol/L (ref 101–111)
Creatinine, Ser: 0.98 mg/dL (ref 0.61–1.24)
GFR calc Af Amer: >60 mL/min (ref >60)
GFR calc non Af Amer: >60 mL/min (ref >60)
Glucose, Bld: 214 mg/dL — ABNORMAL HIGH (ref 65–99)
Potassium: 4.4 mmol/L (ref 3.5–5.1)
Sodium: 135 mmol/L (ref 135–145)

## 2017-03-20 LAB — GLUCOSE, CAPILLARY
Glucose-Capillary: 191 mg/dL — ABNORMAL HIGH (ref 65–99)
Glucose-Capillary: 227 mg/dL — ABNORMAL HIGH (ref 65–99)
Glucose-Capillary: 160 mg/dL — ABNORMAL HIGH (ref 65–99)
Glucose-Capillary: 149 mg/dL — ABNORMAL HIGH (ref 65–99)

## 2017-03-20 LAB — CBC
HCT: 30.8 % — ABNORMAL LOW (ref 39.0–52.0)
Hemoglobin: 9.4 g/dL — ABNORMAL LOW (ref 13.0–17.0)
MCH: 24.8 pg — ABNORMAL LOW (ref 26.0–34.0)
MCHC: 30.5 g/dL (ref 30.0–36.0)
MCV: 81.3 fL (ref 78.0–100.0)
Platelets: 396 K/uL (ref 150–400)
RBC: 3.79 MIL/uL — ABNORMAL LOW (ref 4.22–5.81)
RDW: 16.6 % — ABNORMAL HIGH (ref 11.5–15.5)
WBC: 6.9 K/uL (ref 4.0–10.5)

## 2017-03-20 LAB — C-REACTIVE PROTEIN: CRP: 10.3 mg/dL — AB (ref <1.0)

## 2017-03-20 LAB — SEDIMENTATION RATE: SED RATE: 107 mm/h — AB (ref 0–16)

## 2017-03-20 NOTE — Progress Notes (Signed)
PROGRESS NOTE    Keith E Pae  ZOX:[id number] DOB: [birth year] DOA: 03/10/2017 PCP: [facility]    Brief Narrative:  Keith E Wrightis a 46 y.o.malewith medical history significant for diabetes mellitus 2 on insulin, obesity, BPH on Flomax, hypertension, dyslipidemia, peripheral neuropathy, and septic arthritis of right ankle. [facility]. At that time there was no open wound. Podiatrist recommended to discontinue antibiotics given during the hospitalization. He had previously been treated with Bactrim for 2 weeks. Unfortunately the ankle became worse and hedeveloped an open wound.He also underwent intraoperative I&D on 6/14 with cultures being positive for MSSA and he was started on amoxicillin. He was evaluated on 6/29 by ID and changedto IV cefazolin viaPICC line with this drug actually initiated on 7/3. He completed 6 weeks of antibiotics on 8/14. Shortly after completing these antibiotics he noticed increased swelling and drainage from the ankle and increasing severe shooting type pains. He followed up with his podiatrist on 8/20 and wound cultures again were positive for MSSA. He was started on Bactrim. Oral ciprofloxacin was added on 8/22. Today he followed up with the ID clinic/Dr. Orvan Falconer complaining of extreme weakness, no appetite, andweight loss of 20 pounds. Clinically he appeared pale and was tachycardic with SBPin the 90s. He was sent directly from the office to the hospital for admission. Of note the ankle wound which had nearly healed from the previous surgery and antibiotic treatment has now opened and is at least 3 x 4 cm.   Assessment & Plan:   Principal Problem:   Osteomyelitis of ankle, right, acute (HCC) Active Problems:   Septic arthritis of ankle or foot (HCC)   Type II diabetes mellitus (HCC)   HTN (hypertension)   BPH (benign prostatic hyperplasia)   Diabetic polyneuropathy associated with type 1  diabetes mellitus (HCC)   Hypercholesterolemia   Hypertension   Acute kidney injury (HCC)   Acute hyperkalemia   Anemia, chronic disease  #1 septic arthritis of the right foot/osteomyelitis of the right ankle and large medial abscess Patient presented with recurrent MSSA infection of the right ankle joint which had worsened with abscess formation after recent completion of IV antibiotics. MRI of the right foot consistent with osteomyelitis involving all bones of the hindfoot, midfoot, base of second through fifth metatarsals,talar, subtalar joint with large loculated medial abscess and lateral abscess communicating with tibiotalar joint, 6 cm. -Patient status post I and D per podiatry, Dr. Logan Bores 03/13/2017. Patient noted to be at increased risk for BKA. Long-term IV antibiotics recommended per ID unless patient undergoes BKA. - Wound cultures consistent with MSSA. - Continue rifampin and IV Ancef per ID recommendations. - Patient s/p repeat I and D and application of negative pressure wound VAC per podiatry 03/19/2017. - Antibiotics management per ID. - Per podiatry.  #2 anemia Likely secondary to postop blood loss versus hemodilution. Status post transfusion of 2 units packed red blood cells. H&H stable.  #3 well-controlled type 2 diabetes mellitus Hemoglobin A1c 6.2. CBGs ranging from 160-227. Continue Lantus and sliding scale insulin.  #4 hypertension Continue to hold antihypertensive medications. Outpatient follow-up.   #5 acute kidney injury/hyperkalemia/metabolic acidosis Secondary to prerenal azotemia secondary to dehydration and hypoperfusion, NSAIDs and ARB. Resolved. Continue to hold nephrotoxic agents.  #6 acute hyperkalemia Resolved.  #7 hyperlipidemia Continue home regimen Lipitor.  #8 BPH Continue Flomax.     DVT prophylaxis: Lovenox Code Status: Full Family Communication: Updated patient. No family at  bedside.  Disposition Plan: Home with home health when  cleared by podiatry, hopefully in the next one to 2 days..   Consultants:   Podiatry: Dr. Logan Bores 03/11/2017  Infectious disease: Dr. Orvan Falconer 03/10/2017  Procedures:   MRI right ankle 03/11/2017  Irrigation and debridment / incision and drainage right foot and ankle abscesses--- per Dr. Logan Bores 03/12/2017 2 units packed red blood cells 03/14/2017 I and D right ankle with bone biopsy per Dr. Logan Bores 03/19/2017  Antimicrobials:   Rifampin 03/17/2017  IV Ancef 03/10/2017   Subjective: Patient status post  I and D 03/19/2017. Patient denies chest pain. No shortness of breath. Patient states he's awaiting to speak with podiatrist concerning his septic arthritis. Patient concerned podiatrist might recommend amputation which she's not ready for.  Objective: Vitals:   03/19/17 2000 03/19/17 2018 03/20/17 0303 03/20/17 0539  BP: 113/76 114/67 111/67 105/70  Pulse: 79 86 97 97  Resp: Temp: (!) 97.2 F (36.2 C) 98.2 F (36.8 C) 98.7 F (37.1 C) 99.2 F (37.3 C)  TempSrc:  Oral Oral Oral  SpO2: 97% 98% 96% 100%  Weight:      Height:        Intake/Output Summary (Last 24 hours) at 03/20/17 1321 Last data filed at 03/20/17 1025  Gross per 24 hour  Intake           909.33 ml  Output             1905 ml  Net          -995.67 ml   Filed Weights   03/11/17 0612 03/12/17 1610 03/19/17 1713  Weight: 105.4 kg (232 lb 6.4 oz) 105.4 kg (232 lb 6.4 oz) 105.4 kg (232 lb 6.4 oz)    Examination:  General exam: Appears calm and comfortable. Respiratory system: CTAB. No wheezes, no crackles, no rhonchi. Respiratory effort normal. Cardiovascular system: S1 & S2 heard, RRR. No JVD, murmurs, rubs, gallops or clicks. No pedal edema. Gastrointestinal system: Abdomen is soft, nontender, nondistended, positive bowel sounds. No rebound. No guarding.  Central nervous system: Alert and oriented. No focal neurological deficits. Extremities: Right lower extremity with dressing without  any drainage. Skin: No rashes, lesions or ulcers Psychiatry: Judgement and insight appear normal. Mood & affect appropriate.     Data Reviewed: I have personally reviewed following labs and imaging studies  CBC:  Recent Labs Lab 03/16/17 0349 03/17/17 0457 03/18/17 0344 03/19/17 0557 03/20/17 0448  WBC 6.6 7.7 7.4 6.3 6.9  HGB 9.6* 9.7* 9.8* 10.0* 9.4*  HCT 31.0* 31.6* 31.6* 32.2* 30.8*  MCV 80.7 80.8 80.6 81.3 81.3  PLT 367 402* 422* 410* 396   Basic Metabolic Panel:  Recent Labs Lab 03/16/17 0349 03/17/17 0457 03/18/17 0344 03/19/17 0557 03/20/17 0448  NA 133* 134* 134* 136 135  K 4.4 4.2 4.3 4.3 4.4  CL 102 101 101 101 101  CO2 GLUCOSE 241* 152* 168* 147* 214*  BUN 19 23* 25* 26* 24*  CREATININE 0.85 0.84 0.89 0.87 0.98  CALCIUM 9.3 9.5 9.4 9.6 9.2   GFR: Estimated Creatinine Clearance: 118.2 mL/min (by C-G formula based on SCr of 0.98 mg/dL). Liver Function Tests:  Recent Labs Lab 03/18/17 0344  AST 33  ALT 40  ALKPHOS 58  BILITOT 0.5  PROT 7.2  ALBUMIN 2.5*   No results for input(s): LIPASE, AMYLASE in the last 168 hours. No results for input(s): AMMONIA in the last  168 hours. Coagulation Profile: No results for input(s): INR, PROTIME in the last 168 hours. Cardiac Enzymes: No results for input(s): CKTOTAL, CKMB, CKMBINDEX, TROPONINI in the last 168 hours. BNP (last 3 results) No results for input(s): PROBNP in the last 8760 hours. HbA1C: No results for input(s): HGBA1C in the last 72 hours. CBG:  Recent Labs Lab 03/19/17 1643 03/19/17 1920 03/19/17 2159 03/20/17 0747 03/20/17 1143  GLUCAP 120* 113* 195* 191* 227*   Lipid Profile: No results for input(s): CHOL, HDL, LDLCALC, TRIG, CHOLHDL, LDLDIRECT in the last 72 hours. Thyroid Function Tests: No results for input(s): TSH, T4TOTAL, FREET4, T3FREE, THYROIDAB in the last 72 hours. Anemia Panel: No results for input(s): VITAMINB12, FOLATE, FERRITIN, TIBC, IRON,  RETICCTPCT in the last 72 hours. Sepsis Labs: No results for input(s): PROCALCITON, LATICACIDVEN in the last 168 hours.  Recent Results (from the past 240 hour(s))  Surgical pcr screen     Status: None   Collection Time: 03/11/17 10:02 PM  Result Value Ref Range Status   MRSA, PCR NEGATIVE NEGATIVE Final   Staphylococcus aureus NEGATIVE NEGATIVE Final    Comment: (NOTE) The Xpert SA Assay (FDA approved for NASAL specimens in patients 45 years of age and older), is one component of a comprehensive surveillance program. It is not intended to diagnose infection nor to guide or monitor treatment.   Aerobic/Anaerobic Culture (surgical/deep wound)     Status: None   Collection Time: 03/12/17  5:25 PM  Result Value Ref Range Status   Specimen Description ABSCESS RIGHT ANKLE  Final   Special Requests NONE  Final   Gram Stain   Final    MODERATE WBC PRESENT,BOTH PMN AND MONONUCLEAR NO ORGANISMS SEEN    Culture   Final    RARE STAPHYLOCOCCUS AUREUS NO ANAEROBES ISOLATED    Report Status 03/17/2017 FINAL  Final   Organism ID, Bacteria STAPHYLOCOCCUS AUREUS  Final      Susceptibility   Staphylococcus aureus - MIC*    CIPROFLOXACIN <=0.5 SENSITIVE Sensitive     ERYTHROMYCIN <=0.25 SENSITIVE Sensitive     GENTAMICIN <=0.5 SENSITIVE Sensitive     OXACILLIN 0.5 SENSITIVE Sensitive     TETRACYCLINE <=1 SENSITIVE Sensitive     VANCOMYCIN <=0.5 SENSITIVE Sensitive     TRIMETH/SULFA <=10 SENSITIVE Sensitive     CLINDAMYCIN <=0.25 SENSITIVE Sensitive     RIFAMPIN <=0.5 SENSITIVE Sensitive     Inducible Clindamycin NEGATIVE Sensitive     * RARE STAPHYLOCOCCUS AUREUS         Radiology Studies: No results found.      Scheduled Meds: . aspirin EC  81 mg Oral Daily  . clotrimazole   Topical BID  . enoxaparin (LOVENOX) injection  40 mg Subcutaneous Daily  . ferrous sulfate  325 mg Oral TID WC  . fluconazole  150 mg Oral Once  . gabapentin  1,800 mg Oral TID  . insulin aspart   0-15 Units Subcutaneous TID WC  . insulin aspart  0-5 Units Subcutaneous QHS  . insulin aspart  10 Units Subcutaneous TID WC  . insulin glargine  80 Units Subcutaneous QHS  . PARoxetine  20 mg Oral Daily  . rifampin  300 mg Oral Q12H  . sodium chloride flush  3 mL Intravenous Q12H  . sodium chloride flush  3 mL Intravenous Q12H  . tamsulosin  0.4 mg Oral Daily   Continuous Infusions: . sodium chloride 250 mL (03/19/17 1400)  . sodium chloride    .  ceFAZolin (ANCEF) IV Stopped (03/20/17 1241)  . lactated ringers 10 mL/hr at 03/19/17 1716     LOS: 10 days    Time spent: 35 mins    THOMPSON,DANIEL, MD [facility] Pager [phone]  If 7PM-7AM, please contact night-coverage [email or web address] Password [email or web address] 03/20/2017, 1:21 PM

## 2017-03-21 LAB — GLUCOSE, CAPILLARY
Glucose-Capillary: 74 mg/dL (ref 65–99)
Glucose-Capillary: 154 mg/dL — ABNORMAL HIGH (ref 65–99)

## 2017-03-21 MED ORDER — TRAZODONE HCL 50 MG PO TABS: 25.0000 mg | ORAL_TABLET | Freq: Every evening | ORAL | 0 refills | Status: DC | PRN

## 2017-03-21 MED ORDER — CEFAZOLIN IV (FOR PTA / DISCHARGE USE ONLY): 2.0000 g | Freq: Three times a day (TID) | INTRAVENOUS | 0 refills | Status: AC

## 2017-03-21 MED ORDER — BASAGLAR KWIKPEN 100 UNIT/ML ~~LOC~~ SOPN: 80.0000 [IU] | PEN_INJECTOR | Freq: Every day | SUBCUTANEOUS | 0 refills | Status: AC

## 2017-03-21 MED ORDER — RIFAMPIN 300 MG PO CAPS: 300.0000 mg | ORAL_CAPSULE | Freq: Two times a day (BID) | ORAL | 1 refills | Status: DC

## 2017-03-21 MED ORDER — HEPARIN SOD (PORK) LOCK FLUSH 100 UNIT/ML IV SOLN
250.0000 [IU] | INTRAVENOUS | Status: AC | PRN
  Administered 2017-03-21: 250 [IU]

## 2017-03-21 MED ORDER — OXYCODONE-ACETAMINOPHEN 5-325 MG PO TABS: 1.0000 | ORAL_TABLET | ORAL | 0 refills | Status: DC | PRN

## 2017-03-21 MED ORDER — RIFAMPIN 300 MG PO CAPS: 300.0000 mg | ORAL_CAPSULE | Freq: Two times a day (BID) | ORAL | 0 refills | Status: DC

## 2017-03-21 MED ORDER — CLOTRIMAZOLE 1 % EX CREA: TOPICAL_CREAM | Freq: Two times a day (BID) | CUTANEOUS | 0 refills | Status: DC

## 2017-03-21 NOTE — Progress Notes (Signed)
Text paged care management to assure home health is in place and IV medications and be delivered today.

## 2017-03-21 NOTE — Progress Notes (Signed)
745 cbg 74 juice given

## 2017-03-21 NOTE — Progress Notes (Signed)
[facility] to home around 6pm tonight. [facility] and [facility] they have agreed patient's wife who is a Charity fundraiserN will administer first dose tonight and RN SOC will be in AM.

## 2017-03-21 NOTE — Progress Notes (Signed)
   HPI: 46 year old male who recently underwent incision and drainage with serial debridements last performed in the OR on 03/19/2017 presents postoperatively for follow-up evaluation. Patient states that he wants to do everything in his power to salvage his right lower extremity and prevent amputation. He states that is feeling well and ready to go home. He is currently awaiting discharge   Past Medical History:  Diagnosis Date  . Diabetic peripheral neuropathy (HCC)   . Hyperlipidemia   . Hypertension   . Type I diabetes mellitus (HCC)      Physical Exam: General: The patient is alert and oriented x3 in no acute distress.  Dressings applied are clean dry and intact. Minimal strikethrough noted. Capillary refill to the digits is within normal limits to the right lower extremity. Negative for any erythema or heavy edema proximal to or distal to the dressings.  Neurovascular status also intact with sensation to the digits 1-5 right lower extremity.  Assessment: -  Status post irrigation and debridement right ankle and foot cellulitis  - Likely osteomyelitis right foot  - DMtypeI w/ polyneuropathy  Type 2 diabetes mellitus with other circulatory complication, unspecified whether long term insulin use (HCC) - Plan: Diet Carb Modified  Osteomyelitis of ankle, right, acute (HCC)  Plan of Care:  - Patient was evaluated this afternoon. - From a surgical standpoint the patient is okay to be discharged. Management from this point going forward will consist of ongoing conservative wound care, long-term IV antibiotics as per infectious disease, and immobilization and nonweightbearing in a CAM boot.  -  home health care through [facility] is already arranged according to patient. Dressings should consist of wet-to-dry with saline soaked gauze, dry 4 x 4 gauze, large Kerlix, and Ace wrap every other day.  - A did have a conversation with the patient regarding findings concerning for  degenerative osteomyelitis throughout the osseous structures of the foot intraoperatively. I discussed also below-knee amputation versus additional conservative limb salvage. The patient would like to continue treatment to save his limb and prevent below-knee amputation at this point.  - Follow-up in office upon discharge   Felecia ShellingBrent M. Evans, DPM [facility]  Dr. Felecia ShellingBrent M. Evans, DPM    [address]                                        [place], [place] [address]                Office [phone]  Fax [phone]

## 2017-03-21 NOTE — Discharge Summary (Signed)
Physician Discharge Summary  Keith [name] VPX:[id number] DOB: 07-06-1971 DOA: 03/10/2017  PCP: [facility]  Admit date: 03/10/2017 Discharge date: 03/21/2017  Time spent: 60 minutes  Recommendations for Outpatient Follow-up:  1. Follow-up with [facility] as scheduled. 2. Follow-up with Dr. Megan Salon, ID in 4 weeks. 3. Follow-up with Dr. Amalia Hailey, podiatry next week.   Discharge Diagnoses:  Principal Problem:   Osteomyelitis of ankle, right, acute (HCC) Active Problems:   Septic arthritis of ankle or foot ([place])   Type II diabetes mellitus (HCC)   HTN (hypertension)   BPH (benign prostatic hyperplasia)   Diabetic polyneuropathy associated with type 1 diabetes mellitus (HCC)   Hypercholesterolemia   Hypertension   Acute kidney injury ([place])   Acute hyperkalemia   Anemia, chronic disease   Discharge Condition: Stable and improved  Diet recommendation: Carb modified  Filed Weights   03/11/17 0612 03/12/17 1610 03/19/17 1713  Weight: 105.4 kg (232 lb 6.4 oz) 105.4 kg (232 lb 6.4 oz) 105.4 kg (232 lb 6.4 oz)    History of present illness:  Per Dr.Merrell Keith E Tidwell is a 46 y.o. male with medical history significant for diabetes mellitus 2 on insulin, obesity, BPH on Flomax, hypertension, dyslipidemia, peripheral neuropathy, and septic arthritis of right ankle. The patient was hospitalized at [facility] May 2018 for septic right ankle. At that time there was no open wound. Podiatrist recommended to discontinue antibiotics given during the hospitalization. He had previously been treated with Bactrim for 2 weeks. Unfortunately the ankle became worse and he developed an open wound. He also underwent intraoperative I&D on 6/14 with cultures being positive for MSSA and he was started on amoxicillin. He was evaluated on 6/29 by ID and changed to IV cefazolin via PICC line with this drug actually initiated on 7/3. He completed 6 weeks of antibiotics on 8/14. Shortly after completing  these antibiotics he noticed increased swelling and drainage from the ankle and increasing severe shooting type pains. He followed up with his podiatrist on 8/20 and wound cultures again were positive for MSSA. He was started on Bactrim. Oral ciprofloxacin was added on 8/22. On day of admission, he followed up with the ID clinic/Dr. Megan Salon complaining of extreme weakness, no appetite, and weight loss of 20 pounds. Clinically he appeared pale and was tachycardic with SBP in the 90s. He was sent directly from the office to the hospital for admission. Of note the ankle wound which had nearly healed from the previous surgery and antibiotic treatment has now opened and is at least 3 x 4 cm.  Hospital Course:  #1 septic arthritis of the right foot/osteomyelitis of the right ankle and large medial abscess Patient presented with recurrent MSSA infection of the right ankle joint which had worsened with abscess formation after recent completion of IV antibiotics. MRI of the right foot consistent with osteomyelitis involving all bones of the hindfoot, midfoot, base of second through fifth metatarsals,talar, subtalar joint with large loculated medial abscess and lateral abscess communicating with tibiotalar joint, 6 cm. -Patient status post I and D per podiatry, Dr. Amalia Hailey 03/13/2017. Patient noted to be at increased risk for BKA. Long-term IV antibiotics recommended per ID unless patient undergoes BKA. - Wound cultures consistent with MSSA. - Patient was placed on during the hospitalization rifampin and IV Ancef per ID recommendations. - Patient s/p repeat I and D and application of negative pressure wound VAC per podiatry 03/19/2017. - Antibiotics management per ID. patient was discharged home on rifampin  and IV Ancef and will follow-up with ID and podiatry in the outpatient setting.  #2 anemia Likely secondary to postop blood loss versus hemodilution. Status post transfusion of 2 units packed red blood cells.  H&H stablized.  #3 well-controlled type 2 diabetes mellitus Hemoglobin A1c 6.2. Patient maintained on Lantus and sliding scale insulin.  #4 hypertension Patient's antihypertensive medications were held as blood pressure remained stable. Outpatient follow-up.   #5 acute kidney injury/hyperkalemia/metabolic acidosis Secondary to prerenal azotemia secondary to dehydration and hypoperfusion, NSAIDs and ARB. Resolved with hydration. Nephrotoxic agents were held throughout the hospitalization.  #6 acute hyperkalemia Resolved.  #7 hyperlipidemia Continued on home regimen Lipitor.  #8 BPH Continued on home regimen of Flomax.   Procedures:  MRI right ankle 03/11/2017 Irrigation and debridment / incision and drainage right foot and ankle abscesses--- per Dr. Amalia Hailey 03/12/2017 2 units packed red blood cells 03/14/2017 I and D right ankle with bone biopsy per Dr. Amalia Hailey 03/19/2017  Consultations:  Podiatry: Dr. Amalia Hailey 03/11/2017  Infectious disease: Dr. Megan Salon 03/10/2017   Discharge Exam: Vitals:   03/21/17 0532 03/21/17 1436  BP: 125/68 117/65  Pulse: 89 97  Resp: 18 18  Temp: 98.2 F (36.8 C) 98.3 F (36.8 C)  SpO2: 97% 98%    General: NAD Cardiovascular: RRR Respiratory: CTAB  Discharge Instructions   Discharge Instructions    Diet Carb Modified    Complete by:  As directed    Home infusion instructions [facility] May follow [place] Dosing Protocol; May administer Cathflo as needed to maintain patency of vascular access device.; Flushing of vascular access device: per [facility] Protocol: 0.9% NaCl pre/post medica...    Complete by:  As directed    Instructions:  May follow [place] Dosing Protocol   Instructions:  May administer Cathflo as needed to maintain patency of vascular access device.   Instructions:  Flushing of vascular access device: per [facility] Protocol: 0.9% NaCl pre/post medication administration and prn patency; Heparin 100 u/ml, 66m  for implanted ports and Heparin 10u/ml, 521mfor all other central venous catheters.   Instructions:  May follow [facility] Anaphylaxis Protocol for First Dose Administration in the home: 0.9% NaCl at 25-50 ml/hr to maintain IV access for protocol meds. Epinephrine 0.3 ml IV/IM PRN and Benadryl 25-50 IV/IM PRN s/s of anaphylaxis.   Instructions:  [facility] Coordinator (RN) to assist per patient IV care needs in the home PRN.   Increase activity slowly    Complete by:  As directed      Current Discharge Medication List    START taking these medications   Details  ceFAZolin (ANCEF) IVPB Inject 2 g into the vein every 8 (eight) hours. Indication:  Osteomyelitis/septic joint infection with abscess Last Day of Therapy:  05/15/2017 Labs - Once weekly:  CBC/D and CMP, Labs - Every other week:  ESR and CRP Qty: 171 Units, Refills: 0    clotrimazole (LOTRIMIN) 1 % cream Apply topically 2 (two) times daily. Qty: 30 g, Refills: 0    oxyCODONE-acetaminophen (PERCOCET/ROXICET) 5-325 MG tablet Take 1-2 tablets by mouth every 4 (four) hours as needed for severe pain. Qty: 20 tablet, Refills: 0    !! rifampin (RIFADIN) 300 MG capsule Take 1 capsule (300 mg total) by mouth 2 (two) times daily. Qty: 114 capsule, Refills: 0    !! rifampin (RIFADIN) 300 MG capsule Take 1 capsule (300 mg total) by mouth every 12 (twelve) hours. Qty: 60 capsule, Refills: 1  traZODone (DESYREL) 50 MG tablet Take 0.5 tablets (25 mg total) by mouth at bedtime as needed for sleep. Qty: 15 tablet, Refills: 0     !! - Potential duplicate medications found. Please discuss with provider.    CONTINUE these medications which have CHANGED   Details  Insulin Glargine (BASAGLAR KWIKPEN) 100 UNIT/ML SOPN Inject 0.8 mLs (80 Units total) into the skin daily at 10 pm. Qty: 5 pen, Refills: 0      CONTINUE these medications which have NOT CHANGED   Details  acetaminophen (TYLENOL) 500 MG tablet Take 500 mg by mouth every  6 (six) hours as needed.    aspirin 81 MG tablet Take 81 mg by mouth daily.    atorvastatin (LIPITOR) 40 MG tablet Take 40 mg by mouth daily.    DOXYLAMINE-DM PO Take 2 tablets by mouth at bedtime.    ferrous sulfate 325 (65 FE) MG tablet Take 325 mg by mouth 2 (two) times daily with a meal.     gabapentin (NEURONTIN) 600 MG tablet Take 1,800 mg by mouth 3 (three) times daily.     ibuprofen (ADVIL,MOTRIN) 800 MG tablet Take 1 tablet (800 mg total) by mouth every 8 (eight) hours as needed. Qty: 30 tablet, Refills: 1    insulin aspart (NOVOLOG) 100 UNIT/ML injection Inject 10 Units into the skin 3 (three) times daily before meals. Per sliding scale Qty: 10 mL, Refills: 1    losartan (COZAAR) 50 MG tablet Take 50 mg by mouth daily.    PARoxetine (PAXIL) 20 MG tablet Take 20 mg by mouth daily.     polyethylene glycol (MIRALAX / GLYCOLAX) packet Take 17 g by mouth daily. Mix one tablespoon with 8oz of your favorite juice or water every day until you are having soft formed stools. Then start taking once daily if you didn't have a stool the day before. Qty: 30 each, Refills: 0    Probiotic Product (ALIGN PO) Take 1 capsule by mouth daily.    Psyllium-Calcium (METAMUCIL PLUS CALCIUM) CAPS Take 1 capsule by mouth daily.    tamsulosin (FLOMAX) 0.4 MG CAPS capsule TAKE ONE CAPSULE BY MOUTH DAILY Qty: 30 capsule, Refills: 0      STOP taking these medications     ciprofloxacin (CIPRO) 500 MG tablet      sulfamethoxazole-trimethoprim (BACTRIM DS,SEPTRA DS) 800-160 MG tablet      traMADol (ULTRAM) 50 MG tablet      ceFAZolin (ANCEF) 1 g injection        Allergies  Allergen Reactions  . Daptomycin Anaphylaxis and Shortness Of Breath    Breathing issues  . Lisinopril Other (See Comments)    Cough   Follow-up Information    [facility] Follow up.   Specialty:  Family Medicine Why:  f/u as scheduled. Contact information: [address] [place]  [address] [phone]        Michel Bickers, MD. Schedule an appointment as soon as possible for a visit in 4 week(s).   Specialty:  Infectious Diseases Contact information: [address] [place] [address] [phone]        Edrick Kins, DPM Follow up.   Specialty:  Podiatry Why:  f/u next week. Contact information: [address] [place] [address] [phone]            The results of significant diagnostics from this hospitalization (including imaging, microbiology, ancillary and laboratory) are listed below for reference.  Significant Diagnostic Studies: Mr Ankle Right W Wo Contrast  Result Date: 03/11/2017 CLINICAL DATA:  Right ankle osteomyelitis. EXAM: MRI OF THE RIGHT ANKLE WITHOUT AND WITH CONTRAST TECHNIQUE: Multiplanar, multisequence MR imaging of the ankle was performed before and after the administration of intravenous contrast. CONTRAST:  56m MULTIHANCE GADOBENATE DIMEGLUMINE 529 MG/ML IV SOLN COMPARISON:  Right ankle x-rays dated December 12, 2016. Right foot MRI dated Nov 27, 2016. FINDINGS: TENDONS Peroneal: Mildly thickened. Small amount of fluid in the tendon sheath at the retromalleolar groove. Additional small amount of fluid in the peroneus brevis tendon sheath near its insertion on the base of fifth metatarsal. Posteromedial: Grossly intact. Anterior: Intact. Achilles: Intact. Plantar Fascia: Mild thickening of the plantar fascia. LIGAMENTS Lateral: The anterior talofibular and calcaneofibular ligaments are attenuated and likely chronically torn. Medial: Grossly intact. CARTILAGE Ankle Joint: There is a large ankle joint effusion with enhancing synovium. Subtalar Joints/Sinus Tarsi: Moderate subtalar joint effusion with enhancing synovium. Abnormal signal within the sinus tarsi. Bones: There is prominent, enhancing marrow edema with corresponding T1 hypointensity and cortical destruction involving the distal tibia, distal  fibula, talus, calcaneus, cuboid, navicular, all 3 cuneiforms, and the bases of the second through fifth metatarsals. There is a chronic fracture through the talar neck. Other: There is an open soft tissue defect along the medial ankle. There is a large multiloculated, rim enhancing fluid collection along the medial ankle, measuring approximately 6.2 x 2.4 x 6.6 cm (AP by transverse by CC) . This fluid collection likely communicates with the ankle joint and courses around the flexor hallucis longus muscle and tendon. There is an additional multiloculated 4.8 x 1.6 x 3.7 cm (AP by transverse by CC) rim enhancing fluid collection along the lateral ankle which also communicates with the tibiotalar joint. IMPRESSION: IMPRESSION 1. Diffuse osteomyelitis involving all of the bones of the the hindfoot and midfoot, as well as the bases of the second through fifth metatarsals, as described above, with chronic fracture through the talar neck. 2. Tibiotalar and subtalar joint septic arthritis with large, loculated abscesses along the medial and lateral ankle, which communicate with the tibiotalar joint. The larger fluid collection along the medial ankle measures up to 6.6 cm. 3. Small amount of fluid in the peroneal tendon sheath and distal peroneus brevis tendon sheath, concerning for infectious tenosynovitis. Likely infectious tenosynovitis of the flexor hallucis longus as well. Electronically Signed   By: WTitus DubinM.D.   On: 03/11/2017 16:49    Microbiology: Recent Results (from the past 240 hour(s))  Surgical pcr screen     Status: None   Collection Time: 03/11/17 10:02 PM  Result Value Ref Range Status   MRSA, PCR NEGATIVE NEGATIVE Final   Staphylococcus aureus NEGATIVE NEGATIVE Final    Comment: (NOTE) The Xpert SA Assay (FDA approved for NASAL specimens in patients 249years of age and older), is one component of a comprehensive surveillance program. It is not intended to diagnose infection nor  to guide or monitor treatment.   Aerobic/Anaerobic Culture (surgical/deep wound)     Status: None   Collection Time: 03/12/17  5:25 PM  Result Value Ref Range Status   Specimen Description ABSCESS RIGHT ANKLE  Final   Special Requests NONE  Final   Gram Stain   Final    MODERATE WBC PRESENT,BOTH PMN AND MONONUCLEAR NO ORGANISMS SEEN    Culture   Final    RARE STAPHYLOCOCCUS AUREUS NO ANAEROBES ISOLATED    Report Status 03/17/2017 FINAL  Final   Organism ID, Bacteria STAPHYLOCOCCUS AUREUS  Final      Susceptibility   Staphylococcus aureus - MIC*    CIPROFLOXACIN <=0.5 SENSITIVE Sensitive     ERYTHROMYCIN <=0.25 SENSITIVE Sensitive     GENTAMICIN <=0.5 SENSITIVE Sensitive     OXACILLIN 0.5 SENSITIVE Sensitive     TETRACYCLINE <=1 SENSITIVE Sensitive     VANCOMYCIN <=0.5 SENSITIVE Sensitive     TRIMETH/SULFA <=10 SENSITIVE Sensitive     CLINDAMYCIN <=0.25 SENSITIVE Sensitive     RIFAMPIN <=0.5 SENSITIVE Sensitive     Inducible Clindamycin NEGATIVE Sensitive     * RARE STAPHYLOCOCCUS AUREUS     Labs: Basic Metabolic Panel:  Recent Labs Lab 03/16/17 0349 03/17/17 0457 03/18/17 0344 03/19/17 0557 03/20/17 0448  NA 133* 134* 134* 136 135  K 4.4 4.2 4.3 4.3 4.4  CL 102 101 101 101 101  CO2 '25 26 26 27 26  ' GLUCOSE 241* 152* 168* 147* 214*  BUN 19 23* 25* 26* 24*  CREATININE 0.85 0.84 0.89 0.87 0.98  CALCIUM 9.3 9.5 9.4 9.6 9.2   Liver Function Tests:  Recent Labs Lab 03/18/17 0344  AST 33  ALT 40  ALKPHOS 58  BILITOT 0.5  PROT 7.2  ALBUMIN 2.5*   No results for input(s): LIPASE, AMYLASE in the last 168 hours. No results for input(s): AMMONIA in the last 168 hours. CBC:  Recent Labs Lab 03/16/17 0349 03/17/17 0457 03/18/17 0344 03/19/17 0557 03/20/17 0448  WBC 6.6 7.7 7.4 6.3 6.9  HGB 9.6* 9.7* 9.8* 10.0* 9.4*  HCT 31.0* 31.6* 31.6* 32.2* 30.8*  MCV 80.7 80.8 80.6 81.3 81.3  PLT 367 402* 422* 410* 396   Cardiac Enzymes: No results for input(s):  CKTOTAL, CKMB, CKMBINDEX, TROPONINI in the last 168 hours. BNP: BNP (last 3 results) No results for input(s): BNP in the last 8760 hours.  ProBNP (last 3 results) No results for input(s): PROBNP in the last 8760 hours.  CBG:  Recent Labs Lab 03/20/17 1143 03/20/17 1718 03/20/17 2130 03/21/17 0744 03/21/17 1136  GLUCAP 227* 160* 149* 74 154*       Signed:  Evianna Chandran MD.  [facility] 03/21/2017, 2:37 PM

## 2017-03-21 NOTE — Progress Notes (Signed)
Keith [name] to be D/C'd Home per MD order.  Discussed with the patient and all questions fully answered.  VSS, Skin clean, dry and intact without evidence of skin break down, no evidence of skin tears noted. IV catheter discontinued intact. Site without signs and symptoms of complications. Dressing and pressure applied.  An After Visit Summary was printed and given to the patient. Patient received prescription.  D/c education completed with patient/family including follow up instructions, medication list, d/c activities limitations if indicated, with other d/c instructions as indicated by MD - patient able to verbalize understanding, all questions fully answered.   Patient instructed to return to ED, call 911, or call MD for any changes in condition.   Patient escorted via [place], and D/C home via private auto. Allergies as of 03/21/2017      Reactions   Daptomycin Anaphylaxis, Shortness Of Breath   Breathing issues   Lisinopril Other (See Comments)   Cough      Medication List    STOP taking these medications   ceFAZolin 1 g injection Commonly known as:  ANCEF Replaced by:  ceFAZolin IVPB   ciprofloxacin 500 MG tablet Commonly known as:  CIPRO   sulfamethoxazole-trimethoprim 800-160 MG tablet Commonly known as:  BACTRIM DS,SEPTRA DS   traMADol 50 MG tablet Commonly known as:  ULTRAM     TAKE these medications   acetaminophen 500 MG tablet Commonly known as:  TYLENOL Take 500 mg by mouth every 6 (six) hours as needed.   ALIGN PO Take 1 capsule by mouth daily.   aspirin 81 MG tablet Take 81 mg by mouth daily.   atorvastatin 40 MG tablet Commonly known as:  LIPITOR Take 40 mg by mouth daily.   BASAGLAR KWIKPEN 100 UNIT/ML Sopn Inject 0.8 mLs (80 Units total) into the skin daily at 10 pm. What changed:  how much to take  when to take this  additional instructions   ceFAZolin IVPB Commonly known as:  ANCEF Inject 2 g into the vein every 8 (eight) hours.  Indication:  Osteomyelitis/septic joint infection with abscess Last Day of Therapy:  05/15/2017 Labs - Once weekly:  CBC/D and CMP, Labs - Every other week:  ESR and CRP Replaces:  ceFAZolin 1 g injection   clotrimazole 1 % cream Commonly known as:  LOTRIMIN Apply topically 2 (two) times daily.   DOXYLAMINE-DM PO Take 2 tablets by mouth at bedtime.   ferrous sulfate 325 (65 FE) MG tablet Take 325 mg by mouth 2 (two) times daily with a meal.   gabapentin 600 MG tablet Commonly known as:  NEURONTIN Take 1,800 mg by mouth 3 (three) times daily.   ibuprofen 800 MG tablet Commonly known as:  ADVIL,MOTRIN Take 1 tablet (800 mg total) by mouth every 8 (eight) hours as needed.   insulin aspart 100 UNIT/ML injection Commonly known as:  novoLOG Inject 10 Units into the skin 3 (three) times daily before meals. Per sliding scale What changed:  how much to take  additional instructions   losartan 50 MG tablet Commonly known as:  COZAAR Take 50 mg by mouth daily.   METAMUCIL PLUS CALCIUM Caps Take 1 capsule by mouth daily.   oxyCODONE-acetaminophen 5-325 MG tablet Commonly known as:  PERCOCET/ROXICET Take 1-2 tablets by mouth every 4 (four) hours as needed for severe pain.   PARoxetine 20 MG tablet Commonly known as:  PAXIL Take 20 mg by mouth daily.   polyethylene glycol packet Commonly known as:  MIRALAX /  GLYCOLAX Take 17 g by mouth daily. Mix one tablespoon with 8oz of your favorite juice or water every day until you are having soft formed stools. Then start taking once daily if you didn't have a stool the day before. What changed:  when to take this  reasons to take this  additional instructions   rifampin 300 MG capsule Commonly known as:  RIFADIN Take 1 capsule (300 mg total) by mouth 2 (two) times daily.   rifampin 300 MG capsule Commonly known as:  RIFADIN Take 1 capsule (300 mg total) by mouth every 12 (twelve) hours.   tamsulosin 0.4 MG Caps  capsule Commonly known as:  FLOMAX TAKE ONE CAPSULE BY MOUTH DAILY   traZODone 50 MG tablet Commonly known as:  DESYREL Take 0.5 tablets (25 mg total) by mouth at bedtime as needed for sleep.            Home Infusion Instuctions        Start     Ordered   03/21/17 0000  Home infusion instructions [facility] May follow [place] Dosing Protocol; May administer Cathflo as needed to maintain patency of vascular access device.; Flushing of vascular access device: per [facility] Protocol: 0.9% NaCl pre/post medica...    Question Answer Comment  Instructions May follow [place] Dosing Protocol   Instructions May administer Cathflo as needed to maintain patency of vascular access device.   Instructions Flushing of vascular access device: per [facility] Protocol: 0.9% NaCl pre/post medication administration and prn patency; Heparin 100 u/ml, 58m for implanted ports and Heparin 10u/ml, 578mfor all other central venous catheters.   Instructions May follow [facility] Anaphylaxis Protocol for First Dose Administration in the home: 0.9% NaCl at 25-50 ml/hr to maintain IV access for protocol meds. Epinephrine 0.3 ml IV/IM PRN and Benadryl 25-50 IV/IM PRN s/s of anaphylaxis.   Instructions [facility] Infusion Coordinator (RN) to assist per patient IV care needs in the home PRN.      03/21/17 14[place]      Start     Ordered   03/19/17 1236  For home use only DME Walker rolling  Once    Question:  Patient needs a walker to treat with the following condition  Answer:  Septic arthritis of ankle ([place]  03/19/17 1235       Discharge Care Instructions        Start     Ordered   03/21/17 0000  rifampin (RIFADIN) 300 MG capsule  2 times daily     03/21/17 1436   03/21/17 0000  Home infusion instructions [facility] May follow [place]; May administer Cathflo as needed to maintain patency of vascular access device.; Flushing of  vascular access device[facility]: 0.9% NaCl pre/post medica...    Question Answer Comment  Instructions May follow [place]   Instructions May administer Cathflo as needed to maintain patency of vascular access device.   Instructions Flushing of vascular access device: per [facility]: 0.9% NaCl pre/post medication administration and prn patency; Heparin 100 u/ml, 16m28mor implanted ports and Heparin 10u/ml, 16ml21mr all other central venous catheters.   Instructions May follow [facility] Anaphylaxis Protocol for First Dose Administration in the home: 0.9% NaCl at 25-50 ml/hr to maintain IV access for protocol meds. Epinephrine 0.3 ml IV/IM PRN and Benadryl 25-50 IV/IM PRN s/s of anaphylaxis.  Instructions [facility] Infusion Coordinator (RN) to assist per patient IV care needs in the home PRN.      03/21/17 1436   03/21/17 0000  ceFAZolin (ANCEF) IVPB  Every 8 hours     03/21/17 1436   03/21/17 0000  clotrimazole (LOTRIMIN) 1 % cream  2 times daily     03/21/17 1436   03/21/17 0000  Insulin Glargine (BASAGLAR KWIKPEN) 100 UNIT/ML SOPN  Daily at 10 pm     03/21/17 1436   03/21/17 0000  oxyCODONE-acetaminophen (PERCOCET/ROXICET) 5-325 MG tablet  Every 4 hours PRN     03/21/17 1436   03/21/17 0000  rifampin (RIFADIN) 300 MG capsule  Every 12 hours     03/21/17 1436   03/21/17 0000  traZODone (DESYREL) 50 MG tablet  At bedtime PRN     03/21/17 1436   03/21/17 0000  Increase activity slowly     03/21/17 1436   03/21/17 0000  Diet Carb Modified     03/21/17 1436     Jerimy Johanson K July Nickson 03/21/2017 4:01 PM

## 2017-03-23 ENCOUNTER — Encounter: Payer: Self-pay | Admitting: Internal Medicine

## 2017-03-24 ENCOUNTER — Ambulatory Visit (INDEPENDENT_AMBULATORY_CARE_PROVIDER_SITE_OTHER): Payer: BC Managed Care – PPO | Admitting: Podiatry

## 2017-03-24 VITALS — BP 125/89 | HR 105 | Temp 98.0°F

## 2017-03-24 DIAGNOSIS — L97312 Non-pressure chronic ulcer of right ankle with fat layer exposed: Secondary | ICD-10-CM

## 2017-03-24 DIAGNOSIS — E0842 Diabetes mellitus due to underlying condition with diabetic polyneuropathy: Secondary | ICD-10-CM

## 2017-03-24 DIAGNOSIS — I70235 Atherosclerosis of native arteries of right leg with ulceration of other part of foot: Secondary | ICD-10-CM

## 2017-03-24 NOTE — Progress Notes (Signed)
   HPI: 46 year old male who recently underwent incision and drainage with serial debridements last performed in the OR on 03/19/2017 presents postoperatively for follow-up evaluation. Patient was discharged on 03/21/2017 with active orders for wound care wet-to-dry dressings and PICC line antibiotics. Patient states that he is feeling much better although he still experiences a heavy amount of drainage to the surgical extremity.       Past Medical History:  Diagnosis Date  . Diabetic peripheral neuropathy (HCC)   . Hyperlipidemia   . Hypertension   . Type I diabetes mellitus (HCC)      Physical Exam: General: The patient is alert and oriented x3 in no acute distress.  Dermatology: Skin is cool, dry and supple bilateral lower extremities. Large incisions both medial and lateral noted to the right ankle joint. Medial incision measured approximately 10.0 3.02.0 cm with lateral incision approximately 8.0 2.52.0 cm. To the noted ulcerations there is no eschar. There is a moderate amount of slough fibrin and necrotic tissue noted. Granulation tissue and wound base is red. There is exposed bone tendon and joint noted to both medial and lateral incision sites. No malodor. There is a heavy amount of serosanguineous drainage noted.  Vascular: Neurovascular status intact Palpable pedal pulses bilaterally.   Neurological: Epicritic and protective threshold absent bilaterally.   Musculoskeletal Exam: All pedal and ankle joints range of motion within normal limits bilateral. Muscle strength 5/5 in all groups bilateral.   Assessment: -  Status post irrigation and debridement right ankle and foot cellulitis  - Likely osteomyelitis right foot  - DMtypeI w/ polyneuropathy - Iatrogenic ulcers medial and lateral aspect of the right ankle secondary to diabetes mellitus  Type 2 diabetes mellitus with other circulatory complication, unspecified whether long term insulin use (HCC) - Plan: Diet  Carb Modified  Osteomyelitis of ankle, right, acute (HCC)  Plan of Care:  - Patient was evaluated this afternoon. Bone biopsy results performed intraoperatively were reviewed today. -The patient would like to continue with conservative management at an attempt for limb salvage. - Wet-to-dry saline soaked gauze dressings were applied today. Prior to application medically necessary excisional debridement including muscle and deep fascial tissues performed. Excisional debridement of all the necrotic nonviable tissue down to healthy bleeding viable tissue was performed with post-debridement measurements same as pre-. - Continue home health dressing changes every other day, or daily if drainage is heavy and strikethrough is noted - Continue IV antibiotics as per infectious disease - Continue strict nonweightbearing in the immobilization cam boot - Return to clinic in 1 week  Felecia ShellingBrent M. Floetta Brickey, DPM [facility]  Dr. Felecia ShellingBrent M. Oscar Forman, DPM    [address]                                                [place], [place] [address]                Office [phone]  Fax [phone]

## 2017-03-27 ENCOUNTER — Other Ambulatory Visit: Payer: Self-pay | Admitting: Pharmacist

## 2017-03-30 NOTE — Telephone Encounter (Signed)
Sure, Please order wound vac with dressing changes 3x/week.  Thanks, Dr. Logan Bores

## 2017-03-31 ENCOUNTER — Encounter: Payer: Self-pay | Admitting: Podiatry

## 2017-03-31 ENCOUNTER — Encounter: Payer: BC Managed Care – PPO | Admitting: Podiatry

## 2017-03-31 ENCOUNTER — Ambulatory Visit (INDEPENDENT_AMBULATORY_CARE_PROVIDER_SITE_OTHER): Payer: BC Managed Care – PPO | Admitting: Podiatry

## 2017-03-31 DIAGNOSIS — L97312 Non-pressure chronic ulcer of right ankle with fat layer exposed: Secondary | ICD-10-CM

## 2017-03-31 DIAGNOSIS — I70235 Atherosclerosis of native arteries of right leg with ulceration of other part of foot: Secondary | ICD-10-CM

## 2017-03-31 DIAGNOSIS — E0842 Diabetes mellitus due to underlying condition with diabetic polyneuropathy: Secondary | ICD-10-CM

## 2017-03-31 NOTE — Telephone Encounter (Signed)
Patient was seen in office today and per Dr. Logan Bores, we are going to hold off on the wound vac.  He will return in 2 weeks to discuss further treatment with wound vac.

## 2017-03-31 NOTE — Progress Notes (Signed)
   HPI: 46 year old male who recently underwent incision and drainage with serial debridements last performed in the OR on 03/19/2017 presents postoperatively for follow-up evaluation. Patient is currently changing the dressings daily wet-to-dry. Home healthcare comes to the home once per week for antibiotic dispensed through the PICC line and dressing change. Patient states that overall he is doing much better.      Past Medical History:  Diagnosis Date  . Diabetic peripheral neuropathy (HCC)   . Hyperlipidemia   . Hypertension   . Type I diabetes mellitus (HCC)      Physical Exam: General: The patient is alert and oriented x3 in no acute distress.  Dermatology: Skin is cool, dry and supple bilateral lower extremities. Large incisions both medial and lateral noted to the right ankle joint. Medial incision measured approximately 10.0 3.02.0 cm with lateral incision approximately 8.0 2.52.0 cm. To the noted ulcerations there is no eschar.Necrotic tissue as well as slough appears to be decreased today. Granulation tissue appears healthy and viable and encompasses approximately 90% of the incision sites.  There is exposed bone tendon and joint noted to both medial and lateral incision sites. No malodor. There continues to be heavy drainage noted to the incision sites which is expected at this point.   Vascular: Neurovascular status intact Palpable pedal pulses bilaterally.   Neurological: Epicritic and protective threshold absent bilaterally.   Musculoskeletal Exam: All pedal and ankle joints range of motion within normal limits bilateral. Muscle strength 5/5 in all groups bilateral.   Assessment: -  Status post irrigation and debridement right ankle and foot cellulitis  - Likely osteomyelitis right foot  - DMtypeI w/ polyneuropathy - Iatrogenic ulcers medial and lateral aspect of the right ankle secondary to diabetes mellitus  Type 2 diabetes mellitus with other circulatory  complication, unspecified whether long term insulin use (HCC) - Plan: Diet Carb Modified  Osteomyelitis of ankle, right, acute (HCC)  Plan of Care:  - Patient was evaluated today - At the moment we are going to continue dressing changes 2 times per day wet-to-dry - Continue IV antibiotics as per infectious disease through the PICC line - Continue strict nonweightbearing in the immobilization cam boot - Return to clinic in 2 weeks. At that time if the wounds appear healthy and viable we will proceed to wound VAC application - Return to clinic in 2 weeks   Felecia Shelling, DPM [facility]  Dr. Felecia Shelling, DPM    [address] [place], [place] [address]                Office [phone]  Fax [phone]

## 2017-04-03 ENCOUNTER — Other Ambulatory Visit: Payer: Self-pay

## 2017-04-03 ENCOUNTER — Other Ambulatory Visit: Payer: Self-pay | Admitting: Pharmacist

## 2017-04-03 MED ORDER — GENTAMICIN SULFATE 0.1 % EX CREA: 1.0000 "application " | TOPICAL_CREAM | Freq: Three times a day (TID) | CUTANEOUS | 1 refills | Status: DC

## 2017-04-03 NOTE — Progress Notes (Signed)
Per Dr. Logan Bores, send in Gentamycin Cream for possible infection of wound site.  Patient has been notified of medication and informed to contact infectious disease physician if concerned about infections.   Patient verbalized understanding

## 2017-04-06 ENCOUNTER — Telehealth: Payer: Self-pay | Admitting: Podiatry

## 2017-04-06 NOTE — Telephone Encounter (Signed)
Yes, wound cleanser is okay. Yes, please order aquacel ag. Thanks, Dr. Logan Bores

## 2017-04-06 NOTE — Telephone Encounter (Signed)
Verbal order given to [name] at [facility].

## 2017-04-06 NOTE — Telephone Encounter (Signed)
[name] with [facility]. Calling to see if I can use a wound cleanser on his ankle. Also, I wanted to see if he would order silver alginate as his wound is still draining a tremendous amount. If you would, please call me back at [phone]. Thank you.

## 2017-04-07 ENCOUNTER — Ambulatory Visit: Payer: BC Managed Care – PPO | Admitting: Internal Medicine

## 2017-04-14 ENCOUNTER — Ambulatory Visit (INDEPENDENT_AMBULATORY_CARE_PROVIDER_SITE_OTHER): Payer: BC Managed Care – PPO | Admitting: Podiatry

## 2017-04-14 ENCOUNTER — Encounter: Payer: Self-pay | Admitting: Podiatry

## 2017-04-14 DIAGNOSIS — L97312 Non-pressure chronic ulcer of right ankle with fat layer exposed: Secondary | ICD-10-CM | POA: Diagnosis not present

## 2017-04-14 DIAGNOSIS — I70235 Atherosclerosis of native arteries of right leg with ulceration of other part of foot: Secondary | ICD-10-CM

## 2017-04-14 DIAGNOSIS — E0842 Diabetes mellitus due to underlying condition with diabetic polyneuropathy: Secondary | ICD-10-CM

## 2017-04-14 MED ORDER — OXYCODONE-ACETAMINOPHEN 5-325 MG PO TABS: 1.0000 | ORAL_TABLET | Freq: Three times a day (TID) | ORAL | 0 refills | Status: DC | PRN

## 2017-04-14 NOTE — Progress Notes (Signed)
HPI: 46 year old male who recently underwent incision and drainage with serial debridements last performed in the OR on 03/19/2017 presents postoperatively for follow-up evaluation. Patient is currently changing the dressings daily wet-to-dry. Patient's wife complains of a malodor coming from the dressings. She believes the antibiotics which the patient is having IV through PICC line are not alleviating symptoms. The patient has no appointment coming up with infectious disease. Patient otherwise has no complaints. He states that he is getting some motion and flexion in his digits again. He has been nonweightbearing and immobilization cam boot.      Past Medical History:  Diagnosis Date  . Diabetic peripheral neuropathy (HCC)   . Hyperlipidemia   . Hypertension   . Type I diabetes mellitus (HCC)      Physical Exam: General: The patient is alert and oriented x3 in no acute distress.  Dermatology: Skin is cool, dry and supple bilateral lower extremities. Large incisions both medial and lateral noted to the right ankle joint. Medial incision measured approximately 9.0 3.02.0 cm with lateral incision approximately 8.0 2.52.0 cm. To the noted ulcerations there is no eschar.Necrotic tissue as well as slough appears to be decreased today. Granulation tissue appears healthy and viable and encompasses approximately 90% of the incision sites.  There is exposed bone tendon and joint noted to both medial and lateral incision sites. No malodor. There continues to be heavy drainage noted to the incision sites which is expected at this point.   Vascular: Neurovascular status intact Palpable pedal pulses bilaterally.   Neurological: Epicritic and protective threshold absent bilaterally.   Musculoskeletal Exam: All pedal and ankle joints range of motion within normal limits bilateral. Muscle strength 5/5 in all groups bilateral.   Assessment: -  Status post irrigation and debridement right ankle  and foot cellulitis  - Likely osteomyelitis right foot  - DMtypeI w/ polyneuropathy - Iatrogenic ulcers medial and lateral aspect of the right ankle secondary to diabetes mellitus - Ulcer right ankle secondary diabetes mellitus  Type 2 diabetes mellitus with other circulatory complication, unspecified whether long term insulin use (HCC) - Plan: Diet Carb Modified  Osteomyelitis of ankle, right, acute (HCC)  Plan of Care:  - Patient was evaluated today - Medically necessary excisional debridement including muscle and deep fascial tissue was performed using a tissue nipper. Excisional debridement of all the necrotic nonviable tissue down to healthy bleeding viable tissue was performed with post-debridement measurements same as pre- - Wet-to-dry saline gauze dressings were applied today followed by compressive Ace wrap and cam boot was reapplied  - continued immobilization cam boot nonweightbearing - Continue IV antibiotics as per infectious disease  - Today we are going to attempt to authorize negative pressure wound VAC therapy. There continues to be heavy drainage from the ulceration sites for the past 4 weeks. At this time wound VAC may assist in wound closure. - Return to clinic in 2 weeks   Felecia Shelling, DPM [facility]  Dr. Felecia Shelling, DPM    [address] [place], [place] [address]                Office [phone]  Fax [phone]

## 2017-04-16 ENCOUNTER — Telehealth: Payer: Self-pay

## 2017-04-16 ENCOUNTER — Ambulatory Visit (INDEPENDENT_AMBULATORY_CARE_PROVIDER_SITE_OTHER): Payer: BC Managed Care – PPO | Admitting: Internal Medicine

## 2017-04-16 DIAGNOSIS — M86171 Other acute osteomyelitis, right ankle and foot: Secondary | ICD-10-CM

## 2017-04-16 NOTE — Progress Notes (Signed)
[facility] for Infectious Disease  Patient Active Problem List   Diagnosis Date Noted  . Septic arthritis of ankle or foot ([place]) 01/09/2017    Priority: High  . Osteomyelitis of ankle, right, acute ([place]) 11/27/2016    Priority: High  . Hypertension 03/10/2017  . Acute kidney injury ([place]) 03/10/2017  . Acute hyperkalemia 03/10/2017  . Anemia, chronic disease 03/10/2017  . Age-related nuclear cataract of both eyes 02/16/2017  . Moderate nonproliferative diabetic retinopathy of left eye without macular edema associated with type 1 diabetes mellitus ([place]) 02/16/2017  . 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 Proliferative diabetic retinopathy of right eye without macular edema associated with type 1 diabetes mellitus ([place]) 02/16/2017  . Type II diabetes mellitus ([place]) 01/09/2017  . HTN (hypertension) 01/09/2017  . Neuropathy 01/09/2017  . BPH (benign prostatic hyperplasia) 01/09/2017  . Anemia 12/26/2016  . Diabetic ulcer of right foot associated with diabetes mellitus due to underlying condition ([place]) 03/31/2016  . LPRD (laryngopharyngeal reflux disease) 09/19/2014  . Hypercholesterolemia 08/01/2014  . Diabetic polyneuropathy associated with type 1 diabetes mellitus ([place]) 07/24/2014    Patient's Medications  New Prescriptions   No medications on file  Previous Medications   ACETAMINOPHEN (TYLENOL) 500 MG TABLET    Take 500 mg by mouth every 6 (six) hours as needed.   ASPIRIN 81 MG TABLET    Take 81 mg by mouth daily.   ATORVASTATIN (LIPITOR) 40 MG TABLET    Take 40 mg by mouth daily.   CEFAZOLIN (ANCEF) 1 G INJECTION    Inject into the vein.   CEFAZOLIN (ANCEF) IVPB    Inject 2 g into the vein every 8 (eight) hours. Indication:  Osteomyelitis/septic joint infection with abscess Last Day of Therapy:  05/15/2017 Labs - Once weekly:  CBC/D and CMP, Labs - Every other week:  ESR and CRP   CLOTRIMAZOLE (LOTRIMIN) 1 % CREAM    Apply topically 2 (two) times daily.   DOCUSATE SODIUM (COLACE) 100 MG CAPSULE    Take 100 mg by mouth.   DOXYLAMINE-DM PO    Take 2 tablets by mouth at bedtime.   FERROUS SULFATE 325 (65 FE) MG TABLET    Take 325 mg by mouth 2 (two) times daily with a meal.    GABAPENTIN (NEURONTIN) 600 MG TABLET    Take 1,800 mg by mouth 3 (three) times daily.    GENTAMICIN CREAM (GARAMYCIN) 0.1 %    Apply 1 application topically 3 (three) times daily.   IBUPROFEN (ADVIL,MOTRIN) 800 MG TABLET    Take 1 tablet (800 mg total) by mouth every 8 (eight) hours as needed.   INSULIN ASPART (NOVOLOG) 100 UNIT/ML INJECTION    Inject 10 Units into the skin 3 (three) times daily before meals. Per sliding scale   INSULIN GLARGINE (BASAGLAR KWIKPEN) 100 UNIT/ML SOPN    Inject 0.8 mLs (80 Units total) into the skin daily at 10 pm.   INSULIN GLARGINE (BASAGLAR KWIKPEN) 100 UNIT/ML SOPN    Inject 24 units each morning and 94 units each night, or as directed.   LOSARTAN (COZAAR) 50 MG TABLET    Take 50 mg by mouth daily.   OXYCODONE-ACETAMINOPHEN (PERCOCET/ROXICET) 5-325 MG TABLET    Take 1-2 tablets by mouth every 4 (four) hours as needed for severe pain.   OXYCODONE-ACETAMINOPHEN (PERCOCET/ROXICET) 5-325 MG TABLET    Take 1 tablet by mouth every 8 (eight) hours as needed for severe  pain.   PAROXETINE (PAXIL) 20 MG TABLET    Take 20 mg by mouth daily.    PAROXETINE (PAXIL) 20 MG TABLET    Take 20 mg by mouth.   POLYETHYLENE GLYCOL (MIRALAX / GLYCOLAX) PACKET    Take 17 g by mouth daily. Mix one tablespoon with 8oz of your favorite juice or water every day until you are having soft formed stools. Then start taking once daily if you didn't have a stool the day before.   POLYETHYLENE GLYCOL (MIRALAX / GLYCOLAX) PACKET    Take by mouth.   PROBIOTIC PRODUCT (ALIGN PO)    Take 1 capsule by mouth daily.   PSYLLIUM-CALCIUM (METAMUCIL PLUS CALCIUM) CAPS    Take 1 capsule by mouth daily.   RIFAMPIN (RIFADIN) 300 MG CAPSULE    Take 1 capsule (300 mg total) by mouth 2 (two) times  daily.   RIFAMPIN (RIFADIN) 300 MG CAPSULE    Take 1 capsule (300 mg total) by mouth every 12 (twelve) hours.   RIFAMPIN (RIFADIN) 300 MG CAPSULE    Take 300 mg by mouth.   TAMSULOSIN (FLOMAX) 0.4 MG CAPS CAPSULE    TAKE ONE CAPSULE BY MOUTH DAILY   TRAMADOL (ULTRAM) 50 MG TABLET    Take 50 mg by mouth.   TRAZODONE (DESYREL) 50 MG TABLET    Take 0.5 tablets (25 mg total) by mouth at bedtime as needed for sleep.  Modified Medications   No medications on file  Discontinued Medications   No medications on file    Subjective: Keith [name] is in with his wife, Keith [name], for his hospital follow-up visit. He developed MSSA septic arthritis of his right ankle and underwent incision and drainage on 12/25/2016. He received some oral antibiotics postop until I saw him on 01/13/2017. I started him on IV cefazolin which she took first 6 weeks completing therapy on 02/24/2017. He was doing much better at that time but relapsed very quickly and was admitted to the hospital on 03/10/2017.  MRI showed diffuse osteomyelitis involving all of the bones of the hindfoot and midfoot as well as the basis of the second through fifth metatarsals. There was a chronic fracture through the talar neck. There was a large loculated abscess along the medial and lateral ankle which communicated with the tibiotalar joint. He underwent incision and drainage on 03/12/2017 and again on 03/19/2017. Operative cultures grew MSSA. Biopsy of the talus showed osteomyelitis. Biopsy of the malleolus was normal. He was discharged on IV cefazolin and oral rifampin. He has now completed 37 days of antibiotic therapy. He has not had any problems tolerating his antibiotics or PICC.  He is feeling a little bit better. He is out of work and nonweightbearing. His pain has improved. He and his wife feel like his lateral and medial ankle wounds are improving. He is having a tremendous amount of drainage and has been requiring at least 2 dressing changes per day.  His wife is concerned about some ongoing malodor to the wounds. The drainage and odor have not changed since he left the hospital. He is scheduled to have a wound VAC placed tomorrow.  Review of Systems: Review of Systems  Constitutional: Negative for chills, diaphoresis and fever.  Gastrointestinal: Negative for abdominal pain, diarrhea, nausea and vomiting.  Musculoskeletal: Positive for joint pain.    Past Medical History:  Diagnosis Date  . Diabetic peripheral neuropathy ([place])   . Hyperlipidemia   . Hypertension   . Type I diabetes mellitus ([place])  Social History  Substance Use Topics  . Smoking status: Never Smoker  . Smokeless tobacco: Never Used  . Alcohol use No    Family History  Problem Relation Age of Onset  . Prostate cancer Father   . Bladder Cancer Neg Hx   . Kidney cancer Neg Hx     Allergies  Allergen Reactions  . Daptomycin Anaphylaxis and Shortness Of Breath    Breathing issues  . Lisinopril Other (See Comments)    Cough    Objective: Vitals:   04/16/17 1502  Temp: 97.8 F (36.6 C)  TempSrc: Oral   There is no height or weight on file to calculate BMI.  Physical Exam  Constitutional: He is oriented to person, place, and time.  He is in good spirits.  Musculoskeletal:  He has large open wounds medially and laterally. They show me pictures of his wounds when he left the hospital and both are looking much better. There is good red granulation tissue and I do not see any exposed bone. He has some bleeding from the wounds. I do not see any purulent drainage and I do not detect any malodor.  Neurological: He is alert and oriented to person, place, and time.  Skin: No rash noted.  Right arm PICC site looks good.  Psychiatric: Mood and affect normal.    Lab Results    Problem List Items Addressed This Visit      High   Osteomyelitis of ankle, right, acute ([place])    He has a very severe right foot infection that will be very difficult to  cure. I plan on continuing IV cefazolin and oral rifampin for at least 1 more month. He will follow-up with me on 05/14/2017.          Michel Bickers, MD [facility] [phone] pager   [phone] cell 04/16/2017, 4:48 PM

## 2017-04-16 NOTE — Assessment & Plan Note (Signed)
He has a very severe right foot infection that will be very difficult to cure. I plan on continuing IV cefazolin and oral rifampin for at least 1 more month. He will follow-up with me on 05/14/2017.

## 2017-04-16 NOTE — Telephone Encounter (Signed)
[name] with [facility] called wanted to know if it is ok to use Versatile contact layer on the lateral wound under foam?  He will have wound vac placed tomorrow.  Please advise

## 2017-04-17 ENCOUNTER — Telehealth: Payer: Self-pay | Admitting: *Deleted

## 2017-04-17 NOTE — Telephone Encounter (Signed)
Patient called to report that his [facility] nurse was out today to place a wound vac on his wound and reported to him she was concerned that his Hemogloin is 8.1 from 04/13/17 lab draw. She told him it may get lower with the wound vac on as he is still actively bleeding at the site. He wants to know why he was not told this at his visit. And if we are aware and watching his levels to make sure they do not go to low and he needs a blood transfusion. Advised patient will ask his doctor and give him a call back as soon as possible.

## 2017-04-17 NOTE — Telephone Encounter (Signed)
Sure - that's fine

## 2017-04-20 ENCOUNTER — Encounter: Payer: Self-pay | Admitting: Internal Medicine

## 2017-04-20 NOTE — Telephone Encounter (Signed)
Patient returned call re: low hemoglobin, worried about possibly needing blood transfusion due to wound vac bilaterally on his ankle.  RN spoke with Cassie, [facility] pharmacist regarding patient's hemoglobin level.  Cassie, [facility] pharmacist, stated that transfusion would be considered when the hemoglobin drops to 7 and that the patient's podiatrist/PCP would order the transfusion.  RN called the patient back to share this information.  Patient verbalized understanding.  Patient's podiatrist in in the EPIC.  RN will route the message to Dr. Orvan Falconer.

## 2017-04-22 ENCOUNTER — Other Ambulatory Visit: Payer: Self-pay | Admitting: Pharmacist

## 2017-04-27 ENCOUNTER — Encounter: Payer: Self-pay | Admitting: Internal Medicine

## 2017-04-28 ENCOUNTER — Ambulatory Visit (INDEPENDENT_AMBULATORY_CARE_PROVIDER_SITE_OTHER): Payer: BC Managed Care – PPO | Admitting: Podiatry

## 2017-04-28 ENCOUNTER — Encounter: Payer: Self-pay | Admitting: Podiatry

## 2017-04-28 DIAGNOSIS — L97312 Non-pressure chronic ulcer of right ankle with fat layer exposed: Secondary | ICD-10-CM

## 2017-04-28 DIAGNOSIS — E0842 Diabetes mellitus due to underlying condition with diabetic polyneuropathy: Secondary | ICD-10-CM

## 2017-04-28 DIAGNOSIS — I70235 Atherosclerosis of native arteries of right leg with ulceration of other part of foot: Secondary | ICD-10-CM

## 2017-04-29 NOTE — Progress Notes (Signed)
   HPI: 46 year old male who recently underwent incision and drainage with serial debridements last performed in the OR on 03/19/2017 presents postoperatively for follow-up evaluation. He states the area is slowly healing. A wound VAC was placed yesterday. He has been taking tramadol during the day and oxycodone at night with some relief. He is here for further evaluation and treatment.      Past Medical History:  Diagnosis Date  . Diabetic peripheral neuropathy (HCC)   . Hyperlipidemia   . Hypertension   . Type I diabetes mellitus (HCC)      Physical Exam: General: The patient is alert and oriented x3 in no acute distress.  Dermatology: Skin is cool, dry and supple bilateral lower extremities. Wound VAC left intact today.  Vascular: Neurovascular status intact Palpable pedal pulses bilaterally.   Neurological: Epicritic and protective threshold absent bilaterally.   Musculoskeletal Exam: All pedal and ankle joints range of motion within normal limits bilateral. Muscle strength 5/5 in all groups bilateral.   Assessment: - Status post irrigation and debridement right ankle and foot cellulitis DOS:03/19/17 - Likely osteomyelitis right foot  - DM type I w/ polyneuropathy - Iatrogenic ulcers medial and lateral aspect of the right ankle secondary to diabetes mellitus - Ulcer right ankle secondary diabetes mellitus  Plan of Care:  - Patient was evaluated today - Continue IV antibiotics by Dr. Orvan Falconerampbell, infectious disease. Due to be finished on November 4. - Continue wound VAC applications at home. - Order MRI next visit. - Tentatively plan for partial weightbearing in cam boot in 4 weeks. - Return to clinic in 2 weeks.   Felecia ShellingBrent M. Georgianne Gritz, DPM [facility]  Dr. Felecia ShellingBrent M. Junetta Hearn, DPM    [address]                                                [place], [place] [address]                Office [phone]  Fax [phone]

## 2017-04-30 ENCOUNTER — Other Ambulatory Visit: Payer: Self-pay | Admitting: Pharmacist

## 2017-05-04 ENCOUNTER — Encounter: Payer: Self-pay | Admitting: Internal Medicine

## 2017-05-06 ENCOUNTER — Encounter: Payer: Self-pay | Admitting: Internal Medicine

## 2017-05-07 ENCOUNTER — Other Ambulatory Visit: Payer: Self-pay | Admitting: Pharmacist

## 2017-05-08 ENCOUNTER — Ambulatory Visit (INDEPENDENT_AMBULATORY_CARE_PROVIDER_SITE_OTHER): Payer: BC Managed Care – PPO | Admitting: Podiatry

## 2017-05-08 ENCOUNTER — Encounter: Payer: Self-pay | Admitting: Podiatry

## 2017-05-08 VITALS — BP 158/93 | HR 80 | Temp 98.6°F | Resp 16

## 2017-05-08 DIAGNOSIS — L97312 Non-pressure chronic ulcer of right ankle with fat layer exposed: Secondary | ICD-10-CM | POA: Diagnosis not present

## 2017-05-08 DIAGNOSIS — E0842 Diabetes mellitus due to underlying condition with diabetic polyneuropathy: Secondary | ICD-10-CM

## 2017-05-08 DIAGNOSIS — I70235 Atherosclerosis of native arteries of right leg with ulceration of other part of foot: Secondary | ICD-10-CM | POA: Diagnosis not present

## 2017-05-08 MED ORDER — TRAMADOL HCL 50 MG PO TABS: 50.0000 mg | ORAL_TABLET | Freq: Three times a day (TID) | ORAL | 0 refills | Status: DC | PRN

## 2017-05-08 MED ORDER — OXYCODONE-ACETAMINOPHEN 5-325 MG PO TABS: 1.0000 | ORAL_TABLET | Freq: Three times a day (TID) | ORAL | 0 refills | Status: DC | PRN

## 2017-05-08 NOTE — Progress Notes (Signed)
   HPI: 46 year old male with a history of diabetes mellitus uncontrolled presents today for follow-up evaluation regarding incision and drainage with serial debridements to the right foot and ankle. Last date of surgery was 03/19/2017. Patient states that he continues to experience a heavy amount of drainage. Home healthcare is been applying negative pressure wound vacs every other day. He also notices significant swelling around his right lower extremity as well.      Past Medical History:  Diagnosis Date  . Diabetic peripheral neuropathy (HCC)   . Hyperlipidemia   . Hypertension   . Type I diabetes mellitus (HCC)      Physical Exam: General: The patient is alert and oriented x3 in no acute distress.  Dermatology: Skin is cool, dry and supple bilateral lower extremities. Ulceration noted to the lateral aspect of the patient's right ankle measuring approximately 4.02.00.2 cm. Ulceration noted to the medial aspect of the right ankle measuring approximately 7.54.00.3 cm. Red granular tissue is noted. Wound base is red. There is a heavy amount of serous drainage noted. No malodor. Periwound integrity is mildly macerated.  Vascular: Neurovascular status intact Palpable pedal pulses bilaterally. Moderate edema noted throughout the right lower extremity  Neurological: Epicritic and protective threshold absent bilaterally.   Musculoskeletal Exam: All pedal and ankle joints range of motion within normal limits bilateral. Muscle strength 5/5 in all groups bilateral.   Assessment: - Status post irrigation and debridement right ankle and foot cellulitis DOS:03/19/17 - Likely osteomyelitis right foot  - DM type I w/ polyneuropathy - Iatrogenic ulcers medial and lateral aspect of the right ankle secondary to diabetes mellitus - Ulcer right ankle secondary diabetes mellitus  Plan of Care:  - Patient was evaluated today - Continue IV antibiotics by Dr. Orvan Falconerampbell, infectious disease. Due  to be finished on November 4. - Continue wound VAC applications at home. - Today we will order an MRI of the right lower extremity to rule out any additional abscess and to visualize the deep structures of the right lower extremity - Continue nonweightbearing in the cam boot   until instructed otherwise - Return to clinic in 3 weeks   Felecia ShellingBrent M. Aquiles Ruffini, DPM [facility]  Dr. Felecia ShellingBrent M. Daquann Merriott, DPM    [address]                                                [place], [place] [address]                Office [phone]  Fax [phone](336) [phone]

## 2017-05-11 ENCOUNTER — Encounter: Payer: Self-pay | Admitting: Internal Medicine

## 2017-05-12 ENCOUNTER — Telehealth: Payer: Self-pay

## 2017-05-12 NOTE — Telephone Encounter (Signed)
-----   Message from Felecia ShellingBrent M Evans, DPM sent at 05/08/2017  6:40 PM EDT ----- Regarding: MRI right ankle Please order MRI with or without contrast right ankle  Thanks, Dr. Logan BoresEvans

## 2017-05-12 NOTE — Addendum Note (Signed)
Addended by: Geraldine ContrasVENABLE, Ramsay Bognar D on: 05/12/2017 10:40 AM   Modules accepted: Orders

## 2017-05-12 NOTE — Telephone Encounter (Signed)
Per [employer] website, MRI has been approved from 05/12/2017 to 06/10/17  Auth # [id number] Patient notified of approval and he will call to schedule his MRI appt

## 2017-05-13 ENCOUNTER — Other Ambulatory Visit: Payer: Self-pay | Admitting: Pharmacist

## 2017-05-14 ENCOUNTER — Ambulatory Visit: Payer: BC Managed Care – PPO | Admitting: Internal Medicine

## 2017-05-14 ENCOUNTER — Ambulatory Visit
Admission: RE | Admit: 2017-05-14 | Discharge: 2017-05-14 | Disposition: A | Discharge status: Home / Self Care | Payer: BC Managed Care – PPO | Source: Ambulatory Visit | Attending: Podiatry | Admitting: Podiatry

## 2017-05-14 ENCOUNTER — Ambulatory Visit (INDEPENDENT_AMBULATORY_CARE_PROVIDER_SITE_OTHER): Payer: BC Managed Care – PPO | Admitting: Internal Medicine

## 2017-05-14 ENCOUNTER — Telehealth: Payer: Self-pay

## 2017-05-14 ENCOUNTER — Encounter: Payer: Self-pay | Admitting: Internal Medicine

## 2017-05-14 DIAGNOSIS — E0842 Diabetes mellitus due to underlying condition with diabetic polyneuropathy: Secondary | ICD-10-CM | POA: Insufficient documentation

## 2017-05-14 DIAGNOSIS — M009 Pyogenic arthritis, unspecified: Secondary | ICD-10-CM | POA: Diagnosis not present

## 2017-05-14 DIAGNOSIS — X58XXXA Exposure to other specified factors, initial encounter: Secondary | ICD-10-CM | POA: Diagnosis not present

## 2017-05-14 DIAGNOSIS — S93491A Sprain of other ligament of right ankle, initial encounter: Secondary | ICD-10-CM | POA: Diagnosis not present

## 2017-05-14 DIAGNOSIS — L97312 Non-pressure chronic ulcer of right ankle with fat layer exposed: Secondary | ICD-10-CM | POA: Diagnosis present

## 2017-05-14 MED ORDER — RIFAMPIN 300 MG PO CAPS: 300.0000 mg | ORAL_CAPSULE | Freq: Two times a day (BID) | ORAL | 1 refills | Status: DC

## 2017-05-14 NOTE — Telephone Encounter (Signed)
Per verbal order from Dr. Orvan Falconerampbell I was told to call [facility] to extend the patients Cefazolin for four weeks. I spoke with Coretta who confirmed the order before ending the call.  Keith CourierJose L Maldonado, [place]

## 2017-05-14 NOTE — Assessment & Plan Note (Signed)
For obvious reasons they're very concerned about whether or not his infection is going to be curable without further surgery or even below knee amputation. 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 They are wondering if he needs a different or additional antibiotics. I told them that I believe this is all due to the same MSSA he has had from the very beginning and that changing or adding antibiotics is not likely to offer much benefit but could put him at risk of greater side effects. He will continue cefazolin and rifampin for now and I will call him tomorrow to review the results of the MRI scan. He sees his podiatrist, Dr. Sanjuana LettersBrett Evans in the morning.

## 2017-05-14 NOTE — Progress Notes (Signed)
[facility] for Infectious Disease  Patient Active Problem List   Diagnosis Date Noted  . Septic arthritis of ankle or foot ([place]) 01/09/2017    Priority: High  . Osteomyelitis of ankle, right, acute ([place]) 11/27/2016    Priority: High  . Hypertension 03/10/2017  . Acute kidney injury ([place]) 03/10/2017  . Acute hyperkalemia 03/10/2017  . Anemia, chronic disease 03/10/2017  . Age-related nuclear cataract of both eyes 02/16/2017  . Moderate nonproliferative diabetic retinopathy of left eye without macular edema associated with type 1 diabetes mellitus ([place]) 02/16/2017  . 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 Proliferative diabetic retinopathy of right eye without macular edema associated with type 1 diabetes mellitus ([place]) 02/16/2017  . Type II diabetes mellitus ([place]) 01/09/2017  . HTN (hypertension) 01/09/2017  . Neuropathy 01/09/2017  . BPH (benign prostatic hyperplasia) 01/09/2017  . Anemia 12/26/2016  . Diabetic ulcer of right foot associated with diabetes mellitus due to underlying condition ([name]) 03/31/2016  . LPRD (laryngopharyngeal reflux disease) 09/19/2014  . Hypercholesterolemia 08/01/2014  . Diabetic polyneuropathy associated with type 1 diabetes mellitus ([place]) 07/24/2014    Patient's Medications  New Prescriptions   No medications on file  Previous Medications   ACETAMINOPHEN (TYLENOL) 500 MG TABLET    Take 500 mg by mouth every 6 (six) hours as needed.   ASPIRIN 81 MG TABLET    Take 81 mg by mouth daily.   ATORVASTATIN (LIPITOR) 40 MG TABLET    Take 40 mg by mouth daily.   CEFAZOLIN (ANCEF) 1 G INJECTION    Inject into the vein.   CEFAZOLIN (ANCEF) IVPB    Inject 2 g into the vein every 8 (eight) hours. Indication:  Osteomyelitis/septic joint infection with abscess Last Day of Therapy:  05/15/2017 Labs - Once weekly:  CBC/D and CMP, Labs - Every other week:  ESR and CRP   CLOTRIMAZOLE (LOTRIMIN) 1 % CREAM    Apply topically 2 (two) times daily.   DOCUSATE SODIUM (COLACE) 100 MG CAPSULE    Take 100 mg by mouth.   DOXYLAMINE-DM PO    Take 2 tablets by mouth at bedtime.   FERROUS SULFATE 325 (65 FE) MG TABLET    Take 325 mg by mouth 2 (two) times daily with a meal.    GABAPENTIN (NEURONTIN) 600 MG TABLET    Take 1,800 mg by mouth 3 (three) times daily.    GENTAMICIN CREAM (GARAMYCIN) 0.1 %    Apply 1 application topically 3 (three) times daily.   IBUPROFEN (ADVIL,MOTRIN) 800 MG TABLET    Take 1 tablet (800 mg total) by mouth every 8 (eight) hours as needed.   IBUPROFEN (ADVIL,MOTRIN) 800 MG TABLET    Take 800 mg by mouth.   INSULIN ASPART (NOVOLOG) 100 UNIT/ML INJECTION    Inject 10 Units into the skin 3 (three) times daily before meals. Per sliding scale   INSULIN GLARGINE (BASAGLAR KWIKPEN) 100 UNIT/ML SOPN    Inject 0.8 mLs (80 Units total) into the skin daily at 10 pm.   INSULIN GLARGINE (BASAGLAR KWIKPEN) 100 UNIT/ML SOPN    Inject 24 units each morning and 94 units each night, or as directed.   LOSARTAN (COZAAR) 50 MG TABLET    Take 50 mg by mouth daily.   OXYCODONE-ACETAMINOPHEN (PERCOCET/ROXICET) 5-325 MG TABLET    Take 1-2 tablets by mouth every 4 (four) hours as needed for severe pain.   OXYCODONE-ACETAMINOPHEN (PERCOCET/ROXICET) 5-325 MG TABLET  Take 1 tablet by mouth every 8 (eight) hours as needed for severe pain.   PAROXETINE (PAXIL) 20 MG TABLET    Take 20 mg by mouth daily.    PAROXETINE (PAXIL) 20 MG TABLET    Take 20 mg by mouth.   POLYETHYLENE GLYCOL (MIRALAX / GLYCOLAX) PACKET    Take 17 g by mouth daily. Mix one tablespoon with 8oz of your favorite juice or water every day until you are having soft formed stools. Then start taking once daily if you didn't have a stool the day before.   POLYETHYLENE GLYCOL (MIRALAX / GLYCOLAX) PACKET    Take by mouth.   PROBIOTIC PRODUCT (ALIGN PO)    Take 1 capsule by mouth daily.   PSYLLIUM-CALCIUM (METAMUCIL PLUS CALCIUM) CAPS    Take 1 capsule by mouth daily.   TAMSULOSIN (FLOMAX)  0.4 MG CAPS CAPSULE    TAKE ONE CAPSULE BY MOUTH DAILY   TRAMADOL (ULTRAM) 50 MG TABLET    Take 1 tablet (50 mg total) by mouth every 8 (eight) hours as needed.   TRAZODONE (DESYREL) 50 MG TABLET    Take 0.5 tablets (25 mg total) by mouth at bedtime as needed for sleep.   TRAZODONE (DESYREL) 50 MG TABLET    Take 50 mg by mouth.  Modified Medications   Modified Medication Previous Medication   RIFAMPIN (RIFADIN) 300 MG CAPSULE rifampin (RIFADIN) 300 MG capsule      Take 1 capsule (300 mg total) by mouth 2 (two) times daily.    Take 1 capsule (300 mg total) by mouth 2 (two) times daily.  Discontinued Medications   RIFAMPIN (RIFADIN) 300 MG CAPSULE    Take 1 capsule (300 mg total) by mouth every 12 (twelve) hours.   RIFAMPIN (RIFADIN) 300 MG CAPSULE    Take 300 mg by mouth.    Subjective: Keith [name] and his wife, Keith [name], Therapist, sports for his routine follow-up visit. 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 He relapsed quickly after his initial round of 6 weeks of IV cefazolin that was completed on 02/24/2017. He was readmitted to the hospital on 03/10/2017 and underwent incision and drainage again 2. MSSA was isolated again. He was discharged on IV cefazolin and oral rifampin and has now completed 65 days of therapy. The VAC wound remains in place. He says that the wounds are healing and getting smaller. The wounds have beefy red granulation tissue. He is no longer having a lot of bleeding but is still having a large amount of serosanguineous drainage. He is concerned because he has noted increased swelling and some redness medially around the wound over the last several days. It does sound like he may have been up on his foot more recently. He saw Dr. Amalia Hailey last week who ordered a repeat MRI which is scheduled for this evening.  Review of Systems: Review of Systems  Constitutional: Negative for chills, diaphoresis, fever and weight loss.  Gastrointestinal: Negative for  abdominal pain, diarrhea, nausea and vomiting.  Musculoskeletal: Positive for joint pain.    Past Medical History:  Diagnosis Date  . Diabetic peripheral neuropathy ([place])   . Hyperlipidemia   . Hypertension   . Type I diabetes mellitus ([place])     Social History  Substance Use Topics  . Smoking status: Never Smoker  . Smokeless tobacco: Never Used  . Alcohol use No    Family History  Problem Relation Age of Onset  . Prostate cancer  Father   . Bladder Cancer Neg Hx   . Kidney cancer Neg Hx     Allergies  Allergen Reactions  . Daptomycin Anaphylaxis and Shortness Of Breath    Breathing issues  . Lisinopril Other (See Comments)    Cough    Objective: Vitals:   05/14/17 1057  BP: 119/79  Pulse: 87  Temp: (!) 97.4 F (36.3 C)  TempSrc: Oral   There is no height or weight on file to calculate BMI.  Physical Exam  Constitutional: He is oriented to person, place, and time.  They are frustrated by the lack of clear-cut improvement.  Cardiovascular: Normal rate and regular rhythm.   No murmur heard. Pulmonary/Chest: Effort normal and breath sounds normal.  Musculoskeletal:  I did not remove the VAC dressing. There is some dusky redness around the wound medially under the tape dressing. He has extensive Charcot changes.  Neurological: He is alert and oriented to person, place, and time.  Skin: No rash noted.  PICC site looks good.  Psychiatric: Mood and affect normal.    Lab Results  sedimentation rate 05/11/2017: Elevated at 55  C-reactive protein 05/11/2017: Elevated at 11.3  Problem List Items Addressed This Visit      High   Septic arthritis of ankle or foot ([place])    For obvious reasons they're very concerned about whether or not his infection is going to be curable without further surgery or even below knee amputation. 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  They are wondering if he needs a different or additional antibiotics. I told them that I believe this is all due to the same MSSA he has had from the very beginning and that changing or adding antibiotics is not likely to offer much benefit but could put him at risk of greater side effects. He will continue cefazolin and rifampin for now and I will call him tomorrow to review the results of the MRI scan. He sees his podiatrist, Dr. Merril Abbe in the morning.          Michel Bickers, MD [facility] [phone] pager   [phone] cell 05/14/2017, 12:01 PM

## 2017-05-15 ENCOUNTER — Encounter: Payer: Self-pay | Admitting: Podiatry

## 2017-05-15 ENCOUNTER — Ambulatory Visit (INDEPENDENT_AMBULATORY_CARE_PROVIDER_SITE_OTHER): Payer: BC Managed Care – PPO | Admitting: Podiatry

## 2017-05-15 ENCOUNTER — Telehealth: Payer: Self-pay | Admitting: Podiatry

## 2017-05-15 ENCOUNTER — Telehealth: Payer: Self-pay | Admitting: Internal Medicine

## 2017-05-15 VITALS — BP 136/86 | HR 75 | Temp 98.5°F

## 2017-05-15 DIAGNOSIS — L97312 Non-pressure chronic ulcer of right ankle with fat layer exposed: Secondary | ICD-10-CM

## 2017-05-15 DIAGNOSIS — E0842 Diabetes mellitus due to underlying condition with diabetic polyneuropathy: Secondary | ICD-10-CM

## 2017-05-15 DIAGNOSIS — I70235 Atherosclerosis of native arteries of right leg with ulceration of other part of foot: Secondary | ICD-10-CM | POA: Diagnosis not present

## 2017-05-15 NOTE — Telephone Encounter (Signed)
Hi, this is Doctor, general practicearah, RN with [facility]. I was calling to request wound care orders on the pt. Please call me back at [phone]. Thank you.

## 2017-05-15 NOTE — Telephone Encounter (Signed)
MRI of right ankle laterally 1 2018  IMPRESSION: Marked progression of destructive change and disorganization of the hindfoot and midfoot most consistent with a combination of neuropathic change and chronic infection. As noted above, there is new lateral subluxation of the calcaneus out of the tibiotalar joint and the talus appears fragmented.  Tearing of both the anterior and posterior talofibular ligaments and marked stretch injury of the anterior and posterior inferior tibiofibular ligaments with disruption of the syndesmosis are new since the prior exam. Midfoot collapse is also new since the most recent MRI.  Interval improvement and fluid collections about the ankle. Skin wound along the medial aspect of the distal talus with complex and irregular appearing fluid extending into the midfoot.   Electronically Signed   By: Keith [name] M.D.   On: 05/15/2017 08:49  I called Keith [name] and spoke with him this morning. His MRI shows resolution of the abscesses in his ankle but some new collapse related to pre-existing Charcot arthropathy and severe MSSA osteomyelitis. He met with Dr. Amalia [name] this morning and there is no indication for further surgery at this time. I discussed options for antibiotic management and the prefers to continue on IV cefazolin and oral rifampin for now. He will follow-up with me on 06/02/2017.

## 2017-05-15 NOTE — Telephone Encounter (Signed)
Verbal orders has been called into [name],[facility]  Dressing changes 3xw x 6w Cleanse with wound cleanser or SNS Apply Aquacell ag Cleanse periwound with betadine Cover with gauze and secure with kerlix, coban or ace per dr. Logan BoresEvans orders

## 2017-05-18 ENCOUNTER — Encounter: Payer: Self-pay | Admitting: Internal Medicine

## 2017-05-19 NOTE — Progress Notes (Signed)
HPI: 46 year old male with a history of diabetes mellitus uncontrolled presents today for follow-up evaluation regarding incision and drainage with serial debridements to the right foot and ankle. Last date of surgery was 03/19/2017. Patient believes that the drainage has improved. He recently underwent MRI and he presents to review MRI results and discuss further treatment regarding the right lower extremity.      Past Medical History:  Diagnosis Date  . Diabetic peripheral neuropathy (HCC)   . Hyperlipidemia   . Hypertension   . Type I diabetes mellitus (HCC)      Physical Exam: General: The patient is alert and oriented x3 in no acute distress.  Dermatology: Skin is cool, dry and supple bilateral lower extremities. Ulceration noted to the lateral aspect of the patient's right ankle measuring approximately 4.02.00.2 cm. Ulceration noted to the medial aspect of the right ankle measuring approximately 7.03.50.3 cm.      Red granular tissue is noted. Wound base is red. There is a heavy amount of serous drainage noted. No malodor. Periwound integrity is mildly macerated.  Vascular: Neurovascular status intact Palpable pedal pulses bilaterally. Moderate edema noted throughout the right lower extremity  Neurological: Epicritic and protective threshold absent bilaterally.   Musculoskeletal Exam: All pedal and ankle joints range of motion within normal limits bilateral. Muscle strength 5/5 in all groups bilateral.   MRI Impression:  Marked progression of destructive change and disorganization of the hindfoot and midfoot most consistent with a combination of neuropathic change and chronic infection. As noted above, there is new lateral subluxation of the calcaneus out of the tibiotalar joint and the talus appears fragmented.  Tearing of both the anterior and posterior talofibular ligaments and marked stretch injury of the anterior and posterior inferior tibiofibular  ligaments with disruption of the syndesmosis are new since the prior exam. Midfoot collapse is also new since the most recent MRI.  Interval improvement and fluid collections about the ankle. Skin wound along the medial aspect of the distal talus with complex and irregular appearing fluid extending into the midfoot.  Assessment: - Status post irrigation and debridement right ankle and foot cellulitis DOS:03/19/17 - Likely osteomyelitis right foot  - DM type I w/ polyneuropathy - Iatrogenic ulcers medial and lateral aspect of the right ankle secondary to diabetes mellitus - Ulcer right ankle secondary diabetes mellitus  Plan of Care:  - Patient was evaluated today. MRI results reviewed today - Continue antibiotics by Dr. Orvan Falconerampbell, infectious disease.  - Medically necessary excisional debridement including muscle and deep fascial tissues performed using a tissue nipper. Excisional debridement of all the necrotic nonviable tissue down to healthy bleeding viable tissue was performed with post-debridement measurements and was pre-. Silver nitrate sticks were utilized to cauterize bleeding areas of the wound. The wound was cleansed and dry sterile dressing applied. - Patient understands that he continues to be at high risk for lower extremity amputation. Patient wants to continue conservative treatment. Continue strict nonweightbearing and immobilization cam boot. - Today we will discontinue the negative pressure wound VAC therapy. Orders for dressing changes to be applied daily consisting of Aquacel Ag and dry sterile dressing. - Return to clinic in 3 weeks   Felecia ShellingBrent M. Maeli Spacek, DPM [facility]  Dr. Felecia ShellingBrent M. Floyde Dingley, DPM    [address]  [place], [place] [address]                Office [phone]  Fax [phone](336) [phone]

## 2017-05-22 ENCOUNTER — Other Ambulatory Visit: Payer: Self-pay | Admitting: Pharmacist

## 2017-05-26 ENCOUNTER — Other Ambulatory Visit: Payer: Self-pay | Admitting: Pharmacist

## 2017-06-01 ENCOUNTER — Encounter: Payer: Self-pay | Admitting: Internal Medicine

## 2017-06-02 ENCOUNTER — Encounter: Payer: Self-pay | Admitting: Internal Medicine

## 2017-06-02 ENCOUNTER — Encounter: Payer: Self-pay | Admitting: Podiatry

## 2017-06-02 ENCOUNTER — Ambulatory Visit (INDEPENDENT_AMBULATORY_CARE_PROVIDER_SITE_OTHER): Payer: BC Managed Care – PPO | Admitting: Internal Medicine

## 2017-06-02 ENCOUNTER — Ambulatory Visit (INDEPENDENT_AMBULATORY_CARE_PROVIDER_SITE_OTHER): Payer: BC Managed Care – PPO | Admitting: Podiatry

## 2017-06-02 VITALS — BP 107/70 | HR 79 | Temp 97.4°F

## 2017-06-02 DIAGNOSIS — M86171 Other acute osteomyelitis, right ankle and foot: Secondary | ICD-10-CM

## 2017-06-02 DIAGNOSIS — I70235 Atherosclerosis of native arteries of right leg with ulceration of other part of foot: Secondary | ICD-10-CM

## 2017-06-02 DIAGNOSIS — L97312 Non-pressure chronic ulcer of right ankle with fat layer exposed: Secondary | ICD-10-CM

## 2017-06-02 DIAGNOSIS — E0842 Diabetes mellitus due to underlying condition with diabetic polyneuropathy: Secondary | ICD-10-CM | POA: Diagnosis not present

## 2017-06-02 MED ORDER — CEPHALEXIN 500 MG PO CAPS: 500.0000 mg | ORAL_CAPSULE | Freq: Four times a day (QID) | ORAL | 5 refills | Status: DC

## 2017-06-02 NOTE — Assessment & Plan Note (Signed)
His MRI shows improvement in his foot and ankle abscesses but he has had progressive collapse of multiple bones. This is probably related to the combined effects of severe osteomyelitis and Charcot arthropathy. I am not at all confident that we will be able to cure his infection with antibiotic therapy. I feel like we are probably suppressing infection at this point. I recommended switching from IV cefazolin to oral cephalexin and continuing that along with oral rifampin as chronic suppressive therapy. He is in agreement with that plan.  I also talked to him about the more important outcome of functional recovery and the ability to stand, walk and return to work. He is aware that below-knee amputation is an option that could restore function with greater certainty and more rapidity. He would still like to keep this as an option of last resort. He will follow-up here in 2 months.

## 2017-06-02 NOTE — Progress Notes (Signed)
[facility]  Patient Active Problem List   Diagnosis Date Noted  . 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 Osteomyelitis of ankle, right, acute (HCC) 11/27/2016    Priority: High  . Hypertension 03/10/2017  . Acute kidney injury (HCC) 03/10/2017  . Acute hyperkalemia 03/10/2017  . Anemia, chronic disease 03/10/2017  . Age-related nuclear cataract of both eyes 02/16/2017  . Moderate nonproliferative diabetic retinopathy of left eye without macular edema associated with type 1 diabetes mellitus (HCC) 02/16/2017  . 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 Proliferative diabetic retinopathy of right eye without macular edema associated with type 1 diabetes mellitus (HCC) 02/16/2017  . Type II diabetes mellitus (HCC) 01/09/2017  . HTN (hypertension) 01/09/2017  . Neuropathy 01/09/2017  . BPH (benign prostatic hyperplasia) 01/09/2017  . Anemia 12/26/2016  . Diabetic ulcer of right foot associated with diabetes mellitus due to underlying condition (HCC) 03/31/2016  . LPRD (laryngopharyngeal reflux disease) 09/19/2014  . Hypercholesterolemia 08/01/2014  . Diabetic polyneuropathy associated with type 1 diabetes mellitus (HCC) 07/24/2014      Medication List        Accurate as of 06/02/17  5:21 PM. Always use your most recent med list.          acetaminophen 500 MG tablet Commonly known as:  TYLENOL   ALIGN PO   aspirin 81 MG tablet   atorvastatin 40 MG tablet Commonly known as:  LIPITOR   * BASAGLAR KWIKPEN 100 UNIT/ML Sopn Inject 0.8 mLs (80 Units total) into the skin daily at 10 pm.   * BASAGLAR KWIKPEN 100 UNIT/ML Sopn   ceFAZolin 1 g injection Commonly known as:  ANCEF   cephALEXin 500 MG capsule Commonly known as:  KEFLEX Take 1 capsule (500 mg total) by mouth 4 (four) times daily.   clotrimazole 1 % cream Commonly known as:  LOTRIMIN Apply topically 2 (two) times daily.   docusate  sodium 100 MG capsule Commonly known as:  COLACE   DOXYLAMINE-DM PO   ferrous sulfate 325 (65 FE) MG tablet   gabapentin 600 MG tablet Commonly known as:  NEURONTIN   gentamicin cream 0.1 % Commonly known as:  GARAMYCIN Apply 1 application topically 3 (three) times daily.   * ibuprofen 800 MG tablet Commonly known as:  ADVIL,MOTRIN   * ibuprofen 800 MG tablet Commonly known as:  ADVIL,MOTRIN Take 1 tablet (800 mg total) by mouth every 8 (eight) hours as needed.   insulin aspart 100 UNIT/ML injection Commonly known as:  novoLOG Inject 10 Units into the skin 3 (three) times daily before meals. Per sliding scale   losartan 50 MG tablet Commonly known as:  COZAAR   METAMUCIL PLUS CALCIUM Caps   * oxyCODONE-acetaminophen 5-325 MG tablet Commonly known as:  PERCOCET/ROXICET Take 1-2 tablets by mouth every 4 (four) hours as needed for severe pain.   * oxyCODONE-acetaminophen 5-325 MG tablet Commonly known as:  PERCOCET/ROXICET Take 1 tablet by mouth every 8 (eight) hours as needed for severe pain.   * PARoxetine 20 MG tablet Commonly known as:  PAXIL   * PARoxetine 20 MG tablet Commonly known as:  PAXIL   * polyethylene glycol packet Commonly known as:  MIRALAX / GLYCOLAX Take 17 g by mouth daily. Mix one tablespoon with 8oz of your favorite juice or water every day until you are having soft formed stools. Then start taking  once daily if you didn't have a stool the day before.   * polyethylene glycol packet Commonly known as:  MIRALAX / GLYCOLAX   rifampin 300 MG capsule Commonly known as:  RIFADIN Take 1 capsule (300 mg total) by mouth 2 (two) times daily.   tamsulosin 0.4 MG Caps capsule Commonly known as:  FLOMAX TAKE ONE CAPSULE BY MOUTH DAILY   traMADol 50 MG tablet Commonly known as:  ULTRAM Take 1 tablet (50 mg total) by mouth every 8 (eight) hours as needed.   * traZODone 50 MG tablet Commonly known as:  DESYREL Take 0.5 tablets (25 mg total) by mouth  at bedtime as needed for sleep.   * traZODone 50 MG tablet Commonly known as:  DESYREL      * This list has 12 medication(s) that are the same as other medications prescribed for you. Read the directions carefully, and ask your doctor or other care provider to review them with you.          Where to Get Your Medications    These medications were sent to [facility] [place], [place] - [address]  [address] [place] [employer] [place] [address]   Phone:  [phone]   cephALEXin 500 MG capsule     Subjective: Keith [name] is in with his wife Keith [name] for his routine follow-up visit. He has now completed 84 days of antibiotic therapy with IV cefazolin and oral rifampin following his recent debridements for severe right ankle infection with MSSA. He has had no problems tolerating his PICC or antibiotics. He has been trying to stay off of his right foot as much as possible. I repeat MRI on 05/14/2017 showed:  IMPRESSION: Marked progression of destructive change and disorganization of the hindfoot and midfoot most consistent with a combination of neuropathic change and chronic infection. As noted above, there is new lateral subluxation of the calcaneus out of the tibiotalar joint and the talus appears fragmented.  Tearing of both the anterior and posterior talofibular ligaments and marked stretch injury of the anterior and posterior inferior tibiofibular ligaments with disruption of the syndesmosis are new since the prior exam. Midfoot collapse is also new since the most recent MRI.  Interval improvement and fluid collections about the ankle. Skin wound along the medial aspect of the distal talus with complex and irregular appearing fluid extending into the midfoot.   Electronically Signed   By: Drusilla Kanner M.D.   On: 05/15/2017 08:49  He saw his podiatrist, Dr. Gala Lewandowsky, today. His ankle wounds are healing slowly. His medial wound is now  down to 7 x 2 cm in diameter. The lateral wound is about 2.5 x 1 cm.  Review of Systems: Review of Systems  Constitutional: Negative for chills, fever, malaise/fatigue and weight loss.  Gastrointestinal: Negative for abdominal pain, diarrhea, nausea and vomiting.  Musculoskeletal: Positive for joint pain.       He has very little pain in his foot but will occasionally has some sharp shooting pains and wonders if that is nerves regenerating.    Past Medical History:  Diagnosis Date  . Diabetic peripheral neuropathy (HCC)   . Hyperlipidemia   . Hypertension   . Type I diabetes mellitus (HCC)     Social History   Tobacco Use  . Smoking status: Never Smoker  . Smokeless tobacco: Never Used  Substance Use Topics  . Alcohol use: No    Alcohol/week: 0.0 oz  .  Drug use: No    Family History  Problem Relation Age of Onset  . Prostate cancer Father   . Bladder Cancer Neg Hx   . Kidney cancer Neg Hx     Allergies  Allergen Reactions  . Daptomycin Anaphylaxis and Shortness Of Breath    Breathing issues  . Lisinopril Other (See Comments)    Cough    Objective: Vitals:   06/02/17 1559  BP: (!) 92/56  Pulse: 83  Temp: 98 F (36.7 C)  TempSrc: Oral   There is no height or weight on file to calculate BMI.  Physical Exam  Constitutional: He is oriented to person, place, and time.  He is in good spirits.  Musculoskeletal:  His right foot wounds are wrapped. He is wearing a Velcro boot. He uses a scooter so that his righ foot is not weightbearing.  Neurological: He is alert and oriented to person, place, and time.  Skin: No rash noted.  Right arm PICC site looks good.  Psychiatric: Mood and affect normal.    Lab Results    Problem List Items Addressed This Visit      High   Osteomyelitis of ankle, right, acute (HCC)    His MRI shows improvement in his foot and ankle abscesses but he has had progressive collapse of multiple bones. This is probably related to the  combined effects of severe osteomyelitis and Charcot arthropathy. I am not at all confident that we will be able to cure his infection with antibiotic therapy. I feel like we are probably suppressing infection at this point. I recommended switching from IV cefazolin to oral cephalexin and continuing that along with oral rifampin as chronic suppressive therapy. He is in agreement with that plan.  I also talked to him about the more important outcome of functional recovery and the ability to stand, walk and return to work. He is aware that below-knee amputation is an option that could restore function with greater certainty and more rapidity. He would still like to keep this as an option of last resort. He will follow-up here in 2 months.      Relevant Medications   cephALEXin (KEFLEX) 500 MG capsule       Cliffton AstersJohn Fernanda Twaddell, MD [facility] [phone] pager   [phone] cell 06/02/2017, 5:21 PM

## 2017-06-02 NOTE — Telephone Encounter (Signed)
Per Dr. Orvan Falconerampbell this nurse called [facility] and spoke with Debbie and informed her that Dr. Orvan Falconerampbell wants Cefazolin stopped and PICC pulled on 06/08/2017. Orders read back and confirmed by Debbie. Towanda OctaveLanatra Nakeitha Milligan

## 2017-06-23 ENCOUNTER — Ambulatory Visit: Payer: BC Managed Care – PPO | Admitting: Podiatry

## 2017-06-26 ENCOUNTER — Ambulatory Visit (INDEPENDENT_AMBULATORY_CARE_PROVIDER_SITE_OTHER): Payer: BC Managed Care – PPO | Admitting: Podiatry

## 2017-06-26 ENCOUNTER — Encounter: Payer: Self-pay | Admitting: Podiatry

## 2017-06-26 DIAGNOSIS — L97312 Non-pressure chronic ulcer of right ankle with fat layer exposed: Secondary | ICD-10-CM

## 2017-06-26 DIAGNOSIS — I70235 Atherosclerosis of native arteries of right leg with ulceration of other part of foot: Secondary | ICD-10-CM

## 2017-06-26 DIAGNOSIS — E0842 Diabetes mellitus due to underlying condition with diabetic polyneuropathy: Secondary | ICD-10-CM

## 2017-06-26 NOTE — Progress Notes (Signed)
HPI: 46 year old male with a history of diabetes mellitus uncontrolled presents today for follow-up evaluation regarding incision and drainage with serial debridements to the right foot and ankle. Last date of surgery was 03/19/2017.  Patient states that the incision to the lateral aspect of the surgical extremity has healed.  There continues to be some hyper granulation with drainage noted to the medial incision he presents today for follow-up treatment and evaluation      Past Medical History:  Diagnosis Date  . Diabetic peripheral neuropathy (HCC)   . Hyperlipidemia   . Hypertension   . Type I diabetes mellitus (HCC)      Physical Exam: General: The patient is alert and oriented x3 in no acute distress.  Dermatology: Skin is cool, dry and supple bilateral lower extremities. Ulceration noted to the lateral aspect of the patient's right ankle has healed.  Complete reepithelialization has occurred.  There is negative drainage noted to the lateral incision site.  Medial incision measures approximately 6.5 x 3.4 (LxW) cm with hyper granulation noted throughout the wound.  To the noted ulceration there is no eschar.  There is an extensive amount of granulation tissue.  Granulation tissue and wound base is red.  There is a moderate amount of serosanguineous drainage noted.  There is no exposed bone muscle tendon ligament or joint.  Periwound integrity is intact.  Vascular: Neurovascular status intact Palpable pedal pulses bilaterally. Moderate edema noted throughout the right lower extremity  Neurological: Epicritic and protective threshold absent bilaterally.   Musculoskeletal Exam: All pedal and ankle joints range of motion within normal limits bilateral. Muscle strength 5/5 in all groups bilateral.   MRI Impression:  Marked progression of destructive change and disorganization of the hindfoot and midfoot most consistent with a combination of neuropathic change and chronic  infection. As noted above, there is new lateral subluxation of the calcaneus out of the tibiotalar joint and the talus appears fragmented.  Tearing of both the anterior and posterior talofibular ligaments and marked stretch injury of the anterior and posterior inferior tibiofibular ligaments with disruption of the syndesmosis are new since the prior exam. Midfoot collapse is also new since the most recent MRI.  Interval improvement and fluid collections about the ankle. Skin wound along the medial aspect of the distal talus with complex and irregular appearing fluid extending into the midfoot.  Assessment: - Status post irrigation and debridement right ankle and foot cellulitis DOS:03/19/17 - Likely osteomyelitis right foot  - DM type I w/ polyneuropathy - Iatrogenic ulcers medial and lateral aspect of the right ankle secondary to diabetes mellitus - Ulcer right ankle secondary diabetes mellitus  Plan of Care:  - Patient was evaluated today.  - Continue oral antibiotics as per Dr. Orvan Falconerampbell, infectious disease.  - Medically necessary excisional debridement including muscle and deep fascial tissues performed using a tissue nipper. Excisional debridement of all the necrotic nonviable tissue down to healthy bleeding viable tissue was performed with post-debridement measurements and was pre-. Silver nitrate sticks were utilized to cauterize bleeding areas of the wound. The wound was cleansed and dry sterile dressing applied. -Continue strict nonweightbearing in the immobilization cam boot. -Return to clinic in 3 weeks   Felecia ShellingBrent M. Evans, DPM [facility]  Dr. Felecia ShellingBrent M. Evans, DPM    [address]  [place], [place] [address]                Office [phone]  Fax [phone](336) [phone]

## 2017-07-04 NOTE — Progress Notes (Signed)
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 Last date of surgery was 03/19/2017. Patient believes that the drainage has improved.  The patient's wife has been applying the gentamicin cream over the incision sites over the weekend.  She says there was some green drainage and she was concerned so she was applying the antibiotic cream with improvement.     Past Medical History:  Diagnosis Date  . Diabetic peripheral neuropathy (HCC)   . Hyperlipidemia   . Hypertension   . Type I diabetes mellitus (HCC)      Physical Exam: General: The patient is alert and oriented x3 in no acute distress.  Dermatology: Skin is cool, dry and supple bilateral lower extremities. Ulceration noted to the lateral aspect of the patient's right ankle measuring approximately 3.5 x 1.5 x 0.2 cm. Ulceration noted to the medial aspect of the right ankle measuring approximately 6.8 x 3.5 x 0.3 cm. Red granular tissue is noted. Wound base is red. There is a heavy amount of serous drainage noted. No malodor. Periwound integrity is mildly macerated.  There is a heavy amount of hyper granulation noted to the medial incision site.  Vascular: Neurovascular status intact Palpable pedal pulses bilaterally. Moderate edema noted throughout the right lower extremity  Neurological: Epicritic and protective threshold absent bilaterally.   Musculoskeletal Exam: All pedal and ankle joints range of motion within normal limits bilateral. Muscle strength 5/5 in all groups bilateral.   MRI Impression:  Marked progression of destructive change and disorganization of the hindfoot and midfoot most consistent with a combination of neuropathic change and chronic infection. As noted above, there is new lateral  subluxation of the calcaneus out of the tibiotalar joint and the talus appears fragmented.  Tearing of both the anterior and posterior talofibular ligaments and marked stretch injury of the anterior and posterior inferior tibiofibular ligaments with disruption of the syndesmosis are new since the prior exam. Midfoot collapse is also new since the most recent MRI.  Interval improvement and fluid collections about the ankle. Skin wound along the medial aspect of the distal talus with complex and irregular appearing fluid extending into the midfoot.  Assessment: - Status post irrigation and debridement right ankle and foot cellulitis DOS:03/19/17 - Likely osteomyelitis right foot  - DM type I w/ polyneuropathy - Iatrogenic ulcers medial and lateral aspect of the right ankle secondary to diabetes mellitus - Ulcer right ankle secondary diabetes mellitus  Plan of Care:  - Patient was evaluated today.  - Continue antibiotics by Dr. Orvan Falconerampbell, infectious disease.  The patient has an appointment today with infectious disease. - Medically necessary excisional debridement including muscle and deep fascial tissues performed using a tissue nipper. Excisional debridement of all the necrotic nonviable tissue down to healthy bleeding viable tissue was performed with post-debridement measurements and was pre-. Silver nitrate sticks were utilized to cauterize bleeding areas of the wound. The wound was cleansed and dry sterile dressing applied. -At the moment we will continue conservative treatment. Continue strict nonweightbearing and immobilization cam boot. - Return to clinic in 3 weeks   Felecia ShellingBrent M. Mohmed Farver, DPM [facility]  Dr. Felecia ShellingBrent M. Lianna Sitzmann, DPM    [address]  [place], [place] [address]                Office [phone]  Fax [phone]

## 2017-07-21 ENCOUNTER — Encounter: Payer: Self-pay | Admitting: Podiatry

## 2017-07-21 ENCOUNTER — Ambulatory Visit: Payer: BC Managed Care – PPO | Admitting: Podiatry

## 2017-07-21 ENCOUNTER — Ambulatory Visit (INDEPENDENT_AMBULATORY_CARE_PROVIDER_SITE_OTHER): Payer: BC Managed Care – PPO

## 2017-07-21 ENCOUNTER — Ambulatory Visit: Payer: BC Managed Care – PPO

## 2017-07-21 DIAGNOSIS — I70235 Atherosclerosis of native arteries of right leg with ulceration of other part of foot: Secondary | ICD-10-CM

## 2017-07-21 DIAGNOSIS — L97312 Non-pressure chronic ulcer of right ankle with fat layer exposed: Secondary | ICD-10-CM

## 2017-07-21 DIAGNOSIS — E0842 Diabetes mellitus due to underlying condition with diabetic polyneuropathy: Secondary | ICD-10-CM

## 2017-07-22 ENCOUNTER — Ambulatory Visit: Payer: BC Managed Care – PPO | Admitting: Orthotics

## 2017-07-22 DIAGNOSIS — L97312 Non-pressure chronic ulcer of right ankle with fat layer exposed: Secondary | ICD-10-CM

## 2017-07-22 NOTE — Progress Notes (Signed)
After discussing with patient and wife, it has been determined that Mr. Keith [name] would be best served if referred out to [facility] for evaluation/casting for CROW boot.

## 2017-07-22 NOTE — Progress Notes (Signed)
   HPI: 47 year old male with a history of diabetes mellitus uncontrolled presents today for follow-up evaluation regarding incision and drainage with serial debridements to the right foot and ankle. Last date of surgery was 03/19/2017.  Patient states that the drainage to the medial ankle ulceration site has improved.  He has consistently noticed less and less drainage.  Patient states that he is feeling very well and denies any pain at the moment.  He has been weightbearing in the immobilization cam boot over the past few weeks while at home.  He presents today for follow-up treatment and evaluation      Past Medical History:  Diagnosis Date  . Diabetic peripheral neuropathy (HCC)   . Hyperlipidemia   . Hypertension   . Type I diabetes mellitus (HCC)      Physical Exam: General: The patient is alert and oriented x3 in no acute distress.  Dermatology: Skin is cool, dry and supple bilateral lower extremities. Ulceration noted to the lateral aspect of the patient's right ankle has healed.  Complete reepithelialization has occurred.  There is negative drainage noted to the lateral incision site.  Medial incision measures approximately 6.0 x 3.0 (LxW) cm with hyper granulation noted throughout the wound.  To the noted ulceration there is no eschar.  There is an extensive amount of granulation tissue.  Granulation tissue and wound base is red.  There is a moderate amount of serosanguineous drainage noted.  There is no exposed bone muscle tendon ligament or joint.  Periwound integrity is intact.  Vascular: Neurovascular status intact Palpable pedal pulses bilaterally. Moderate edema noted throughout the right lower extremity  Neurological: Epicritic and protective threshold absent bilaterally.   Musculoskeletal Exam: All pedal and ankle joints range of motion within normal limits bilateral. Muscle strength 5/5 in all groups bilateral.    Assessment: - Status post irrigation and  debridement right ankle and foot cellulitis DOS:03/19/17 - Likely osteomyelitis right foot  - DM type I w/ polyneuropathy - Iatrogenic ulcers medial and lateral aspect of the right ankle secondary to diabetes mellitus - Ulcer right ankle secondary diabetes mellitus  Plan of Care:  - Patient was evaluated today.  - Continue oral antibiotics as per Dr. Orvan Falconerampbell, infectious disease.  - Medically necessary excisional debridement including muscle and deep fascial tissues performed using a tissue nipper. Excisional debridement of all the necrotic nonviable tissue down to healthy bleeding viable tissue was performed with post-debridement measurements and was pre-. Silver nitrate sticks were utilized to cauterize bleeding areas of the wound. The wound was cleansed and dry sterile dressing applied. -At this point the patient can begin partial weightbearing in the immobilization cam boot -Today we are going to set up an appointment with Raiford Nobleick, Pedorthist for a custom molded [employer]	[place] boot -Return to clinic in 4 weeks  Felecia ShellingBrent M. Evans, DPM [facility]  Dr. Felecia ShellingBrent M. Evans, DPM    [address]                                                [place], [place] [address]                Office [phone]  Fax [phone]) [phone]

## 2017-08-04 ENCOUNTER — Ambulatory Visit: Payer: BC Managed Care – PPO | Admitting: Internal Medicine

## 2017-08-11 ENCOUNTER — Ambulatory Visit (INDEPENDENT_AMBULATORY_CARE_PROVIDER_SITE_OTHER): Payer: BC Managed Care – PPO | Admitting: Internal Medicine

## 2017-08-11 ENCOUNTER — Encounter: Payer: Self-pay | Admitting: Internal Medicine

## 2017-08-11 DIAGNOSIS — M86171 Other acute osteomyelitis, right ankle and foot: Secondary | ICD-10-CM

## 2017-08-11 NOTE — Assessment & Plan Note (Signed)
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  I will repeat inflammatory markers today.  He will complete a few more weeks of rifampin and then continue cephalexin alone.  He will follow-up in 2 months.

## 2017-08-11 NOTE — Progress Notes (Signed)
[facility]  Patient Active Problem List   Diagnosis Date Noted  . 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 Osteomyelitis of ankle, right, acute (HCC) 11/27/2016    Priority: High  . Hypertension 03/10/2017  . Acute kidney injury (HCC) 03/10/2017  . Acute hyperkalemia 03/10/2017  . Anemia, chronic disease 03/10/2017  . Age-related nuclear cataract of both eyes 02/16/2017  . Moderate nonproliferative diabetic retinopathy of left eye without macular edema associated with type 1 diabetes mellitus (HCC) 02/16/2017  . 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 Proliferative diabetic retinopathy of right eye without macular edema associated with type 1 diabetes mellitus (HCC) 02/16/2017  . Type II diabetes mellitus (HCC) 01/09/2017  . HTN (hypertension) 01/09/2017  . Neuropathy 01/09/2017  . BPH (benign prostatic hyperplasia) 01/09/2017  . Anemia 12/26/2016  . Diabetic ulcer of right foot associated with diabetes mellitus due to underlying condition (HCC) 03/31/2016  . LPRD (laryngopharyngeal reflux disease) 09/19/2014  . Hypercholesterolemia 08/01/2014  . Diabetic polyneuropathy associated with type 1 diabetes mellitus (HCC) 07/24/2014    Patient's Medications  New Prescriptions   No medications on file  Previous Medications   ACETAMINOPHEN (TYLENOL) 500 MG TABLET    Take 500 mg by mouth every 6 (six) hours as needed.   ASPIRIN 81 MG TABLET    Take 81 mg by mouth daily.   ATORVASTATIN (LIPITOR) 40 MG TABLET    Take 40 mg by mouth daily.   CEPHALEXIN (KEFLEX) 500 MG CAPSULE    Take 1 capsule (500 mg total) by mouth 4 (four) times daily.   CLOTRIMAZOLE (LOTRIMIN) 1 % CREAM    Apply topically 2 (two) times daily.   DM-DOXYLAMINE-ACETAMINOPHEN 30-12.11-998 MG/30ML LIQD    Take by mouth.   DOCUSATE SODIUM (COLACE) 100 MG CAPSULE    Take 100 mg by mouth.   DOXYLAMINE-DM PO    Take 2 tablets by mouth at bedtime.   FERROUS SULFATE 325 (65 FE) MG TABLET    Take 325 mg by mouth 2 (two) times daily with a meal.    FERROUS SULFATE 325 (65 FE) MG TABLET    Take 325 mg by mouth.   GABAPENTIN (NEURONTIN) 600 MG TABLET    Take 1,800 mg by mouth 3 (three) times daily.    GENTAMICIN CREAM (GARAMYCIN) 0.1 %    Apply 1 application topically 3 (three) times daily.   IBUPROFEN (ADVIL,MOTRIN) 800 MG TABLET    Take 1 tablet (800 mg total) by mouth every 8 (eight) hours as needed.   IBUPROFEN (ADVIL,MOTRIN) 800 MG TABLET    Take 800 mg by mouth.   INSULIN ASPART (NOVOLOG) 100 UNIT/ML INJECTION    Inject 10 Units into the skin 3 (three) times daily before meals. Per sliding scale   INSULIN GLARGINE (BASAGLAR KWIKPEN) 100 UNIT/ML SOPN    Inject 0.8 mLs (80 Units total) into the skin daily at 10 pm.   INSULIN GLARGINE (BASAGLAR KWIKPEN) 100 UNIT/ML SOPN    Inject 24 units each morning and 94 units each night, or as directed.   LACTOBACILLUS RHAMNOSUS, GG, PO    Take by mouth.   LOSARTAN (COZAAR) 50 MG TABLET    Take 50 mg by mouth daily.   OXYCODONE-ACETAMINOPHEN (PERCOCET/ROXICET) 5-325 MG TABLET    Take 1-2 tablets by mouth every 4 (four) hours as needed for severe pain.   OXYCODONE-ACETAMINOPHEN (PERCOCET/ROXICET) 5-325 MG TABLET  Take 1 tablet by mouth every 8 (eight) hours as needed for severe pain.   PAROXETINE (PAXIL) 20 MG TABLET    Take 20 mg by mouth daily.    PAROXETINE (PAXIL) 20 MG TABLET    Take 20 mg by mouth.   POLYETHYLENE GLYCOL (MIRALAX / GLYCOLAX) PACKET    Take 17 g by mouth daily. Mix one tablespoon with 8oz of your favorite juice or water every day until you are having soft formed stools. Then start taking once daily if you didn't have a stool the day before.   POLYETHYLENE GLYCOL (MIRALAX / GLYCOLAX) PACKET    Take by mouth.   PROBIOTIC PRODUCT (ALIGN PO)    Take 1 capsule by mouth daily.   PSYLLIUM-CALCIUM (METAMUCIL PLUS CALCIUM) CAPS    Take 1 capsule by mouth daily.   TAMSULOSIN (FLOMAX) 0.4 MG  CAPS CAPSULE    TAKE ONE CAPSULE BY MOUTH DAILY   TRAMADOL (ULTRAM) 50 MG TABLET    Take 1 tablet (50 mg total) by mouth every 8 (eight) hours as needed.   TRAZODONE (DESYREL) 50 MG TABLET    Take 0.5 tablets (25 mg total) by mouth at bedtime as needed for sleep.   TRAZODONE (DESYREL) 50 MG TABLET    Take 50 mg by mouth.  Modified Medications   No medications on file  Discontinued Medications   No medications on file    Subjective: Keith [name] is in for his routine visit.  He has now completed 5 months of total antibiotic therapy since his most recent surgery for his right ankle MSSA osteomyelitis.  He has been on oral cephalexin and rifampin for the past 2 months.  He has had no problems tolerating his antibiotics.  He is not having any pain.  His medial ankle wound is getting smaller and there is much less drainage.  He is now been allowed to be up and weightbearing recently.  He will be starting a new job soon with [employer].  He is worried about having gained about 30 pounds.  He has not been checking his blood sugars on a regular basis.  Review of Systems: Review of Systems  Constitutional: Negative for chills, diaphoresis and fever.  Gastrointestinal: Negative for abdominal pain, diarrhea, nausea and vomiting.  Musculoskeletal: Negative for joint pain.  Skin: Negative for rash.    Past Medical History:  Diagnosis Date  . Diabetic peripheral neuropathy (HCC)   . Hyperlipidemia   . Hypertension   . Type I diabetes mellitus (HCC)     Social History   Tobacco Use  . Smoking status: Never Smoker  . Smokeless tobacco: Never Used  Substance Use Topics  . Alcohol use: No    Alcohol/week: 0.0 oz  . Drug use: No    Family History  Problem Relation Age of Onset  . Prostate cancer Father   . Bladder Cancer Neg Hx   . Kidney cancer Neg Hx     Allergies  Allergen Reactions  . Daptomycin Anaphylaxis and Shortness Of Breath    Breathing issues  . Lisinopril  Other (See Comments)    Cough    Objective: Vitals:   08/11/17 0847  BP: (!) 154/80  Pulse: 91  Temp: 98 F (36.7 C)  TempSrc: Oral  Weight: 262 lb (118.8 kg)   Body mass index is 35.53 kg/m.  Physical Exam  Constitutional:  He is in good spirits.  He is accompanied by his wife.  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  The medial wound is much smaller and is now only about the size of a silver dollar.  There is good granulation tissue over virtually all of the wound.  There is no odor.    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  I will repeat inflammatory markers today.  He will complete a few more weeks of rifampin and then continue cephalexin alone.  He will follow-up in 2 months.      Relevant Orders   C-reactive protein   Sedimentation rate   Basic metabolic panel   CBC   Hemoglobin A1c       Cliffton AstersJohn Levy Wellman, MD [facility] [phone] pager   [phone] cell 08/11/2017, 9:13 AM

## 2017-08-12 LAB — BASIC METABOLIC PANEL
BUN / CREAT RATIO: 25 (calc) — AB (ref 6–22)
BUN: 31 mg/dL — ABNORMAL HIGH (ref 7–25)
CO2: 23 mmol/L (ref 20–32)
Calcium: 9.8 mg/dL (ref 8.6–10.3)
Chloride: 103 mmol/L (ref 98–110)
Creat: 1.23 mg/dL (ref 0.60–1.35)
GLUCOSE: 283 mg/dL — AB (ref 65–99)
POTASSIUM: 5.1 mmol/L (ref 3.5–5.3)
SODIUM: 136 mmol/L (ref 135–146)

## 2017-08-12 LAB — CBC
HCT: 38.4 % — ABNORMAL LOW (ref 38.5–50.0)
Hemoglobin: 12.9 g/dL — ABNORMAL LOW (ref 13.2–17.1)
MCH: 27.2 pg (ref 27.0–33.0)
MCHC: 33.6 g/dL (ref 32.0–36.0)
MCV: 81 fL (ref 80.0–100.0)
MPV: 11.9 fL (ref 7.5–12.5)
PLATELETS: 217 10*3/uL (ref 140–400)
RBC: 4.74 10*6/uL (ref 4.20–5.80)
RDW: 14.7 % (ref 11.0–15.0)
WBC: 4.9 10*3/uL (ref 3.8–10.8)

## 2017-08-12 LAB — SEDIMENTATION RATE: Sed Rate: 51 mm/h — ABNORMAL HIGH (ref 0–15)

## 2017-08-12 LAB — C-REACTIVE PROTEIN: CRP: 31.2 mg/L — ABNORMAL HIGH (ref <8.0)

## 2017-08-18 ENCOUNTER — Ambulatory Visit: Payer: BC Managed Care – PPO | Admitting: Podiatry

## 2017-08-18 ENCOUNTER — Encounter: Payer: Self-pay | Admitting: Podiatry

## 2017-08-18 VITALS — BP 133/76 | HR 85 | Temp 97.1°F

## 2017-08-18 DIAGNOSIS — L97312 Non-pressure chronic ulcer of right ankle with fat layer exposed: Secondary | ICD-10-CM | POA: Diagnosis not present

## 2017-08-18 DIAGNOSIS — L600 Ingrowing nail: Secondary | ICD-10-CM

## 2017-08-18 DIAGNOSIS — I70235 Atherosclerosis of native arteries of right leg with ulceration of other part of foot: Secondary | ICD-10-CM | POA: Diagnosis not present

## 2017-08-18 DIAGNOSIS — E0842 Diabetes mellitus due to underlying condition with diabetic polyneuropathy: Secondary | ICD-10-CM

## 2017-08-18 NOTE — Patient Instructions (Signed)

## 2017-08-31 NOTE — Progress Notes (Signed)
HPI: 47 year old male with a history of diabetes mellitus uncontrolled presents today for follow-up evaluation regarding incision and drainage with serial debridements to the right foot and ankle. Last date of surgery was 03/19/2017.  Patient states that the drainage to the medial ankle ulceration site has improved.  Patient states that he was recently cast[facility] to the right lower extremity made by [facility].  He is scheduled today to go pick up his [employer]	[place] boot. Patient also presents with a new complaint regarding an ingrown toenail to the right great toe medial side.  Patient states that is been red and swollen with drainage for the past few weeks.  Patient would like to have the ingrown toenail addressed.      Past Medical History:  Diagnosis Date  . Diabetic peripheral neuropathy (HCC)   . Hyperlipidemia   . Hypertension   . Type I diabetes mellitus (HCC)      Physical Exam: General: The patient is alert and oriented x3 in no acute distress.  Dermatology: Skin is cool, dry and supple bilateral lower extremities. Ulceration noted to the lateral aspect of the patient's right ankle has healed.  Complete reepithelialization has occurred.  There is negative drainage noted to the lateral incision site. Ingrowing toenail noted to the medial aspect of the right great toe with an erythematous border and minimal amount of drainage noted.  There is some pain on palpation with the medial border of the nail plate as well.  Medial incision measures approximately 4.0 x 3.0 (LxW) cm with hyper granulation noted throughout the wound.  To the noted ulceration there is no eschar.  There is an extensive amount of granulation tissue.  Granulation tissue and wound base is red.  There is a moderate amount of serosanguineous drainage noted.  There is no exposed bone muscle tendon ligament or joint.  Periwound integrity is intact.  Vascular: Neurovascular status intact Palpable pedal  pulses bilaterally. Moderate edema noted throughout the right lower extremity  Neurological: Epicritic and protective threshold absent bilaterally.   Musculoskeletal Exam: All pedal and ankle joints range of motion within normal limits bilateral. Muscle strength 5/5 in all groups bilateral.    Assessment: - Status post irrigation and debridement right ankle and foot cellulitis DOS:03/19/17 - Likely osteomyelitis right foot  - DM type I w/ polyneuropathy - Iatrogenic ulcers medial and lateral aspect of the right ankle secondary to diabetes mellitus - Ulcer right ankle secondary diabetes mellitus -Ingrown toenail right great toe medial border  Plan of Care:  - Patient was evaluated today.  - Continue oral antibiotics as per Dr. Orvan Falconerampbell, infectious disease.  - Medically necessary excisional debridement including muscle and deep fascial tissues performed using a tissue nipper. Excisional debridement of all the necrotic nonviable tissue down to healthy bleeding viable tissue was performed with post-debridement measurements and was pre-. Silver nitrate sticks were utilized to cauterize bleeding areas of the wound. The wound was cleansed and dry sterile dressing applied. -Continue partial weightbearing in the immobilization cam boot.  Patient scheduled to get his [employer]	[place] boot today from [facility].  Weightbearing as tolerated. -Today I discussed the treatment options regarding his ingrown toenail.  Patient would like to have the ingrown toenail removed permanently.  All possible complications and details the procedure were explained.  Local anesthesia infiltration was utilized consisting of 3 mL of 2% lidocaine plain to the right great toe in a hallux block fashion. -The toe was prepped in aseptic manner  and partial permanent nail avulsion procedure was performed with 3 x 30-second applications of phenol followed by alcohol flush and dry sterile dressing.  Postprocedure care instructions were  provided. -Return to clinic in 2 weeks  Felecia Shelling, DPM [facility]  Dr. Felecia Shelling, DPM    [address] [place], [place] [address]                Office [phone]  Fax [phone]

## 2017-09-01 ENCOUNTER — Ambulatory Visit: Payer: BC Managed Care – PPO

## 2017-09-01 ENCOUNTER — Ambulatory Visit (INDEPENDENT_AMBULATORY_CARE_PROVIDER_SITE_OTHER): Payer: BC Managed Care – PPO

## 2017-09-01 ENCOUNTER — Other Ambulatory Visit: Payer: Self-pay | Admitting: Podiatry

## 2017-09-01 ENCOUNTER — Ambulatory Visit (INDEPENDENT_AMBULATORY_CARE_PROVIDER_SITE_OTHER): Payer: BC Managed Care – PPO | Admitting: Podiatry

## 2017-09-01 ENCOUNTER — Encounter: Payer: Self-pay | Admitting: Podiatry

## 2017-09-01 VITALS — BP 97/66 | HR 94 | Temp 99.0°F

## 2017-09-01 DIAGNOSIS — R52 Pain, unspecified: Secondary | ICD-10-CM

## 2017-09-01 DIAGNOSIS — L97312 Non-pressure chronic ulcer of right ankle with fat layer exposed: Secondary | ICD-10-CM | POA: Diagnosis not present

## 2017-09-01 DIAGNOSIS — L03115 Cellulitis of right lower limb: Secondary | ICD-10-CM

## 2017-09-01 DIAGNOSIS — E11622 Type 2 diabetes mellitus with other skin ulcer: Secondary | ICD-10-CM

## 2017-09-01 DIAGNOSIS — E0842 Diabetes mellitus due to underlying condition with diabetic polyneuropathy: Secondary | ICD-10-CM

## 2017-09-01 DIAGNOSIS — L97309 Non-pressure chronic ulcer of unspecified ankle with unspecified severity: Secondary | ICD-10-CM | POA: Diagnosis not present

## 2017-09-01 DIAGNOSIS — M14671 Charcot's joint, right ankle and foot: Secondary | ICD-10-CM

## 2017-09-02 ENCOUNTER — Encounter: Payer: Self-pay | Admitting: Internal Medicine

## 2017-09-02 LAB — CBC WITH DIFFERENTIAL/PLATELET
BASOS: 0 %
Basophils Absolute: 0 10*3/uL (ref 0.0–0.2)
EOS (ABSOLUTE): 0.1 10*3/uL (ref 0.0–0.4)
EOS: 2 %
HEMATOCRIT: 29 % — AB (ref 37.5–51.0)
HEMOGLOBIN: 9.8 g/dL — AB (ref 13.0–17.7)
Immature Grans (Abs): 0.2 10*3/uL — ABNORMAL HIGH (ref 0.0–0.1)
Immature Granulocytes: 2 %
LYMPHS ABS: 0.7 10*3/uL (ref 0.7–3.1)
Lymphs: 9 %
MCH: 26.8 pg (ref 26.6–33.0)
MCHC: 33.8 g/dL (ref 31.5–35.7)
MCV: 80 fL (ref 79–97)
MONOCYTES: 9 %
MONOS ABS: 0.7 10*3/uL (ref 0.1–0.9)
NEUTROS ABS: 5.9 10*3/uL (ref 1.4–7.0)
Neutrophils: 78 %
Platelets: 363 10*3/uL (ref 150–379)
RBC: 3.65 x10E6/uL — ABNORMAL LOW (ref 4.14–5.80)
RDW: 15.7 % — AB (ref 12.3–15.4)
WBC: 7.6 10*3/uL (ref 3.4–10.8)

## 2017-09-02 LAB — BASIC METABOLIC PANEL
BUN / CREAT RATIO: 28 — AB (ref 9–20)
BUN: 39 mg/dL — ABNORMAL HIGH (ref 6–24)
CO2: 21 mmol/L (ref 20–29)
CREATININE: 1.39 mg/dL — AB (ref 0.76–1.27)
Calcium: 9.7 mg/dL (ref 8.7–10.2)
Chloride: 100 mmol/L (ref 96–106)
GFR calc Af Amer: 69 mL/min/{1.73_m2} (ref >59)
GFR calc non Af Amer: 60 mL/min/{1.73_m2} (ref >59)
Glucose: 125 mg/dL — ABNORMAL HIGH (ref 65–99)
Potassium: 5.6 mmol/L — ABNORMAL HIGH (ref 3.5–5.2)
SODIUM: 136 mmol/L (ref 134–144)

## 2017-09-03 LAB — WOUND CULTURE: Organism ID, Bacteria: NONE SEEN

## 2017-09-03 NOTE — Progress Notes (Signed)
HPI: 47 year old male with a history of diabetes mellitus uncontrolled presents today for follow-up evaluation regarding incision and drainage with serial debridements to the right foot and ankle. Last date of surgery was 03/19/2017.  The patient recently was fitted for a Charcot boot for the right lower extremity dispensed by [facility].  Patient states that over the past week he received the boot and he was excited to wear it.  He believes that initially it fit very comfortably, however after wearing the boot he noticed significant irritation with rubbing and pain and swelling to the right lower extremity.  Patient subsequently developed a significant amount of erythema and edema to the right foot and ankle concerning for infection.  He is very frustrated today and presents for further treatment and evaluation      Past Medical History:  Diagnosis Date  . Diabetic peripheral neuropathy (HCC)   . Hyperlipidemia   . Hypertension   . Type I diabetes mellitus (HCC)      Physical Exam: General: The patient is alert and oriented x3 in no acute distress.  Dermatology: Skin is cool, dry and supple bilateral lower extremities.   Medial ulcer measures approximately 4.0 x 3.0 (LxW) cm with hyper granulation noted throughout the wound.  To the noted ulceration there is no eschar.  There is an extensive amount of granulation tissue.  Granulation tissue and wound base is red.  There is a moderate amount of serosanguineous drainage noted.  There is no exposed bone muscle tendon ligament or joint.  Periwound integrity is intact.  No malodor noted.  Vascular: Significant amount of erythema and edema is noted to the right foot and ankle.  The erythema dissipates just proximal to the ankle joint.  Neurological: Epicritic and protective threshold absent bilaterally.   Musculoskeletal Exam: Significant degenerative changes with disarticulation of the ankle joint in relationship to the foot  noted to the right lower extremity consistent with changes secondary to Charcot neuroarthropathy.  The patient has noticed some increased pain in relation to the right foot.  He also has significant amount of heel pain today with pain on palpation of the plantar aspect of the heel.  This is a new finding  Assessment: -Acute cellulitis right foot and ankle - DM type I w/ polyneuropathy -Ulcer right medial ankle secondary to diabetes mellitus  Plan of Care:  - Patient was evaluated today.  - Continue oral antibiotics as per Dr. Orvan Falconerampbell, infectious disease.  Recommended the patient follow-up with Dr. Orvan Falconerampbell.  Patient has a scheduled appointment with Dr. Orvan Falconerampbell next week. - Medically necessary excisional debridement including muscle and deep fascial tissues performed using a tissue nipper. Excisional debridement of all the necrotic nonviable tissue down to healthy bleeding viable tissue was performed with post-debridement measurements and was pre-. Silver nitrate sticks were utilized to cauterize bleeding areas of the wound. The wound was cleansed and dry sterile dressing applied. -Today we are going to order blood work as well as wound culture to be sent to pathology for culture and sensitivity -At the moment we are going to discontinue the use of the [employer]	[place] boot and return to non-weightbearing in the immobilization cam boot.  The patient needs a new cam boot.  Today a cam boot was dispensed. -Return to clinic in 2 weeks   Keith [name], DPM [facility]  Dr. [name], DPM    [address]  [place], [place] [address]                Office [phone]  Fax [phone]

## 2017-09-04 ENCOUNTER — Ambulatory Visit (INDEPENDENT_AMBULATORY_CARE_PROVIDER_SITE_OTHER): Payer: BC Managed Care – PPO | Admitting: Internal Medicine

## 2017-09-04 ENCOUNTER — Encounter: Payer: Self-pay | Admitting: Internal Medicine

## 2017-09-04 ENCOUNTER — Telehealth: Payer: Self-pay | Admitting: Podiatry

## 2017-09-04 DIAGNOSIS — M86171 Other acute osteomyelitis, right ankle and foot: Secondary | ICD-10-CM | POA: Diagnosis not present

## 2017-09-04 NOTE — Telephone Encounter (Signed)
Michelle with Dr. Blair Dolphinampbell's office has called and is requesting copies of x-rays. Please fax records to [phone].

## 2017-09-04 NOTE — Assessment & Plan Note (Addendum)
Keith [name] is having increased pain, swelling and redness of his right foot and ankle despite multiple surgeries and continuous antibiotic therapy since August of last year for his MSSA septic arthritis and osteomyelitis.  It is unclear to me whether the recent changes are due to the [facility] boot, nonspecific swelling related to his flulike illness, persistent infection, progressive Charcot arthropathy and collapse or some combination of the above.  However, whatever combination of factors are contributing I am not sure that we are going to be able to save his foot at this point and give him a reasonable quality of life.  I do not think that a repeat MRI or bone biopsy is likely to change his outcome.  I talked to them again about the option of below-knee amputation.  He tells me that he knows that that is the right decision but he has to be ready to accept it.  I discussed several options today including direct admission to the hospital, attempting to arrange outpatient consultation with orthopedics or vascular surgery to discuss BKA, or continuing to do what he has been doing as an outpatient.  He would like to go back home today and have them talk about options this weekend.  They will contact me early next week to let me know which way they would like to proceed.

## 2017-09-04 NOTE — Progress Notes (Signed)
[facility]  Patient Active Problem List   Diagnosis Date Noted  . 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 Osteomyelitis of ankle, right, acute (HCC) 11/27/2016    Priority: High  . Hypertension 03/10/2017  . Acute kidney injury (HCC) 03/10/2017  . Acute hyperkalemia 03/10/2017  . Anemia, chronic disease 03/10/2017  . Age-related nuclear cataract of both eyes 02/16/2017  . Moderate nonproliferative diabetic retinopathy of left eye without macular edema associated with type 1 diabetes mellitus (HCC) 02/16/2017  . 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 Proliferative diabetic retinopathy of right eye without macular edema associated with type 1 diabetes mellitus (HCC) 02/16/2017  . Type II diabetes mellitus (HCC) 01/09/2017  . HTN (hypertension) 01/09/2017  . Neuropathy 01/09/2017  . BPH (benign prostatic hyperplasia) 01/09/2017  . Anemia 12/26/2016  . Diabetic ulcer of right foot associated with diabetes mellitus due to underlying condition (HCC) 03/31/2016  . LPRD (laryngopharyngeal reflux disease) 09/19/2014  . Hypercholesterolemia 08/01/2014  . Diabetic polyneuropathy associated with type 1 diabetes mellitus (HCC) 07/24/2014    Patient's Medications  New Prescriptions   No medications on file  Previous Medications   ACETAMINOPHEN (TYLENOL) 500 MG TABLET    Take 500 mg by mouth every 6 (six) hours as needed.   ASPIRIN 81 MG TABLET    Take 81 mg by mouth daily.   ATORVASTATIN (LIPITOR) 40 MG TABLET    Take 40 mg by mouth daily.   CEPHALEXIN (KEFLEX) 500 MG CAPSULE    Take 1 capsule (500 mg total) by mouth 4 (four) times daily.   CLOTRIMAZOLE (LOTRIMIN) 1 % CREAM    Apply topically 2 (two) times daily.   DM-DOXYLAMINE-ACETAMINOPHEN 30-12.11-998 MG/30ML LIQD    Take by mouth.   DOCUSATE SODIUM (COLACE) 100 MG CAPSULE    Take 100 mg by mouth.   DOXYLAMINE-DM PO    Take 2 tablets by mouth at bedtime.   FERROUS SULFATE 325 (65 FE) MG TABLET    Take 325 mg by mouth 2 (two) times daily with a meal.    FERROUS SULFATE 325 (65 FE) MG TABLET    Take 325 mg by mouth.   GABAPENTIN (NEURONTIN) 600 MG TABLET    Take 1,800 mg by mouth 3 (three) times daily.    GENTAMICIN CREAM (GARAMYCIN) 0.1 %    Apply 1 application topically 3 (three) times daily.   IBUPROFEN (ADVIL,MOTRIN) 800 MG TABLET    Take 1 tablet (800 mg total) by mouth every 8 (eight) hours as needed.   IBUPROFEN (ADVIL,MOTRIN) 800 MG TABLET    Take 800 mg by mouth.   INSULIN ASPART (NOVOLOG) 100 UNIT/ML INJECTION    Inject 10 Units into the skin 3 (three) times daily before meals. Per sliding scale   INSULIN GLARGINE (BASAGLAR KWIKPEN) 100 UNIT/ML SOPN    Inject 0.8 mLs (80 Units total) into the skin daily at 10 pm.   INSULIN GLARGINE (BASAGLAR KWIKPEN) 100 UNIT/ML SOPN    Inject 24 units each morning and 94 units each night, or as directed.   LACTOBACILLUS RHAMNOSUS, GG, PO    Take by mouth.   LOSARTAN (COZAAR) 50 MG TABLET    Take 50 mg by mouth daily.   OXYCODONE-ACETAMINOPHEN (PERCOCET/ROXICET) 5-325 MG TABLET    Take 1-2 tablets by mouth every 4 (four) hours as needed for severe pain.   OXYCODONE-ACETAMINOPHEN (PERCOCET/ROXICET) 5-325 MG TABLET  Take 1 tablet by mouth every 8 (eight) hours as needed for severe pain.   PAROXETINE (PAXIL) 20 MG TABLET    Take 20 mg by mouth daily.    PAROXETINE (PAXIL) 20 MG TABLET    Take 20 mg by mouth.   POLYETHYLENE GLYCOL (MIRALAX / GLYCOLAX) PACKET    Take 17 g by mouth daily. Mix one tablespoon with 8oz of your favorite juice or water every day until you are having soft formed stools. Then start taking once daily if you didn't have a stool the day before.   POLYETHYLENE GLYCOL (MIRALAX / GLYCOLAX) PACKET    Take by mouth.   PROBIOTIC PRODUCT (ALIGN PO)    Take 1 capsule by mouth daily.   PSYLLIUM-CALCIUM (METAMUCIL PLUS CALCIUM) CAPS    Take 1 capsule by mouth daily.   TAMSULOSIN (FLOMAX) 0.4 MG  CAPS CAPSULE    TAKE ONE CAPSULE BY MOUTH DAILY   TRAMADOL (ULTRAM) 50 MG TABLET    Take 1 tablet (50 mg total) by mouth every 8 (eight) hours as needed.   TRAZODONE (DESYREL) 50 MG TABLET    Take 0.5 tablets (25 mg total) by mouth at bedtime as needed for sleep.   TRAZODONE (DESYREL) 50 MG TABLET    Take 50 mg by mouth.  Modified Medications   No medications on file  Discontinued Medications   No medications on file    Subjective: Keith [name] is seen on a work in basis.  He has now completed 6 months of total antibiotic therapy since his most recent surgery for his right ankle MSSA osteomyelitis.  He has been on oral cephalexin and rifampin for the past 3 months.  He has had no problems tolerating his antibiotics.  He recently switched into a crow boot.  Shortly after that he developed a flulike illness.  His influenza screen was negative in his PCPs office but he was treated with empiric oseltamivir.  He had fever, nausea and vomiting and may several doses of his antibiotics over a 4-day period.  That illness has resolved but he recently developed increased pain, swelling and redness in his right foot and.  He was taken out of the [employer]	[place] and put back in a Cam boot on 09/01/2017.  He has not noted any improvement in his foot since that time.  He just started a new job last week.  Review of Systems: Review of Systems  Constitutional: Negative for chills, diaphoresis and fever.  Gastrointestinal: Negative for abdominal pain, diarrhea, nausea and vomiting.  Musculoskeletal: Positive for joint pain.  Skin: Negative for rash.    Past Medical History:  Diagnosis Date  . Diabetic peripheral neuropathy (HCC)   . Hyperlipidemia   . Hypertension   . Type I diabetes mellitus (HCC)     Social History   Tobacco Use  . Smoking status: Never Smoker  . Smokeless tobacco: Never Used  Substance Use Topics  . Alcohol use: No    Alcohol/week: 0.0 oz  . Drug use: No    Family History  Problem  Relation Age of Onset  . Prostate cancer Father   . Bladder Cancer Neg Hx   . Kidney cancer Neg Hx     Allergies  Allergen Reactions  . Daptomycin Anaphylaxis and Shortness Of Breath    Breathing issues  . Lisinopril Other (See Comments)    Cough    Objective: Vitals:   09/04/17 1136  BP: 134/75  Pulse: (!) 109  Temp: 97.9 F (  36.6 C)  TempSrc: Oral  Weight: 249 lb (112.9 kg)   Body mass index is 33.77 kg/m.  Physical Exam  Constitutional: He is oriented to person, place, and time.  He is by himself today but his wife participated in the visit by phone.  He was tearful at times.  Cardiovascular: Normal rate and regular rhythm.  No murmur heard. Pulmonary/Chest: He has no wheezes. He has no rales.  He seems somewhat short of breath at times during the visit.  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  The medial wound is much smaller and is now only about the size of a silver dollar.  There is good granulation tissue over virtually all of the wound.  There is no odor.  Neurological: He is alert and oriented to person, place, and time.  He had diffuse swelling of his right foot and ankle was significantly worse than on his previous visit.  He has a silver dollar sized wound over his medial ankle with serous drainage.  There is no odor.  X-rays taken in Dr. Logan Bores office on 09/01/2017 showed extensive Charcot arthropathy.  I have no report comparing that x-ray to previous films.  Skin: No rash noted.  Psychiatric: Mood and affect normal.    Lab Results    Problem List Items Addressed This Visit      High   Osteomyelitis of ankle, right, acute (HCC)    Keith [name] is having increased pain, swelling and redness of his right foot and ankle despite multiple surgeries and continuous antibiotic therapy since August of last year for his MSSA septic arthritis and osteomyelitis.  It is unclear to me whether the recent changes are due to the [facility]  boot, nonspecific swelling related to his flulike illness, persistent infection, progressive Charcot arthropathy and collapse or some combination of the above.  However, whatever combination of factors are contributing I am not sure that we are going to be able to save his foot at this point and give him a reasonable quality of life.  I do not think that a repeat MRI or bone biopsy is likely to change his outcome.  I talked to them again about the option of below-knee amputation.  He tells me that he knows that that is the right decision but he has to be ready to accept it.  I discussed several options today including direct admission to the hospital, attempting to arrange outpatient consultation with orthopedics or vascular surgery to discuss BKA, or continuing to do what he has been doing as an outpatient.  He would like to go back home today and have them talk about options this weekend.  They will contact me early next week to let me know which way they would like to proceed.          Cliffton Asters, MD [facility] [phone] pager   [phone] cell 09/04/2017, 12:03 PM

## 2017-09-07 ENCOUNTER — Telehealth: Payer: Self-pay | Admitting: *Deleted

## 2017-09-07 ENCOUNTER — Encounter: Payer: Self-pay | Admitting: Internal Medicine

## 2017-09-07 DIAGNOSIS — M86171 Other acute osteomyelitis, right ankle and foot: Secondary | ICD-10-CM

## 2017-09-07 NOTE — Telephone Encounter (Signed)
Patient referred to Dr Thora LanceLuigi Pascarella per patient's request and verbal order from Dr Orvan Falconerampbell. [facility] [place] [facility] [phone]). Per [place], she is able to [facility]. Patient notified, in agreement.  His wife Toni AmendCourtney will coordinate scheduling. Andree CossHowell, Alec Mcphee M, RN

## 2017-09-15 ENCOUNTER — Encounter: Payer: Self-pay | Admitting: Podiatry

## 2017-09-15 ENCOUNTER — Ambulatory Visit (INDEPENDENT_AMBULATORY_CARE_PROVIDER_SITE_OTHER): Payer: BC Managed Care – PPO | Admitting: Podiatry

## 2017-09-15 DIAGNOSIS — Q828 Other specified congenital malformations of skin: Secondary | ICD-10-CM

## 2017-09-15 DIAGNOSIS — L03115 Cellulitis of right lower limb: Secondary | ICD-10-CM | POA: Diagnosis not present

## 2017-09-15 DIAGNOSIS — L97312 Non-pressure chronic ulcer of right ankle with fat layer exposed: Secondary | ICD-10-CM

## 2017-09-15 DIAGNOSIS — E0842 Diabetes mellitus due to underlying condition with diabetic polyneuropathy: Secondary | ICD-10-CM

## 2017-09-15 DIAGNOSIS — M14671 Charcot's joint, right ankle and foot: Secondary | ICD-10-CM | POA: Diagnosis not present

## 2017-09-15 DIAGNOSIS — L84 Corns and callosities: Secondary | ICD-10-CM

## 2017-09-15 NOTE — Progress Notes (Signed)
   HPI: 47 year old male with a history of diabetes mellitus uncontrolled presents today for follow-up evaluation regarding incision and drainage with serial debridements to the right foot and ankle. Last date of surgery was 03/19/2017.  The patient was last seen for an acute flareup of possible cellulitis versus irritation from the newly dispensed Charcot boot.  Patient was recently seen by his infectious disease physician, Dr. Orvan Falconerampbell.  At that time the had discussed the possibility of below-knee amputation to the right lower extremity.  The patient presents today stating that mentally he is ready for the below-knee amputation.  We have tried all conservative modalities to attempt to salvage his lower extremity without any alleviation of symptoms for the patient.      Past Medical History:  Diagnosis Date  . Diabetic peripheral neuropathy (HCC)   . Hyperlipidemia   . Hypertension   . Type I diabetes mellitus (HCC)      Physical Exam: General: The patient is alert and oriented x3 in no acute distress.  Dermatology: Skin is cool, dry and supple bilateral lower extremities.   Medial ulcer measures approximately 4.0 x 3.0 (LxW) cm with hyper granulation noted throughout the wound.  To the noted ulceration there is no eschar.  There is an extensive amount of granulation tissue.  Granulation tissue and wound base is red.  There is a moderate amount of serosanguineous drainage noted.  There is no exposed bone muscle tendon ligament or joint.  Periwound integrity is intact.  No malodor noted.  Vascular: Significant amount of erythema and edema is noted to the right foot and ankle.  The erythema dissipates just proximal to the ankle joint.  Neurological: Epicritic and protective threshold absent bilaterally.   Musculoskeletal Exam: Significant degenerative changes with disarticulation of the ankle joint in relationship to the foot noted to the right lower extremity consistent with changes  secondary to Charcot neuroarthropathy.  The patient has noticed some increased pain in relation to the right foot.  He also has significant amount of heel pain today with pain on palpation of the plantar aspect of the heel.   Assessment: -Acute cellulitis right foot and ankle-improved - DM type I w/ polyneuropathy -Ulcer right medial ankle secondary to diabetes mellitus -Pre-ulcerative callus lesions left foot x2  Plan of Care:  - Patient was evaluated today.  -Dry sterile dressings were applied to the right foot.  Recommend that the patient continue wearing the immobilization cam boot and taking oral antibiotics until below-knee amputation surgery which is scheduled for 09/25/2017, [facility]. -Excisional debridement of the pre-callus lesions was performed to the left foot using a chisel blade without incident or bleeding.  Recommended the patient continue wearing diabetic shoe to the left foot -Return to clinic in approximately 3 months for routine foot care   Felecia ShellingBrent M. Muzammil Bruins, DPM [facility]  Dr. Felecia ShellingBrent M. Neoma Uhrich, DPM    [address]                                                [place], [place] [address]                Office [phone]  Fax [phone]

## 2017-09-18 ENCOUNTER — Encounter: Payer: Self-pay | Admitting: Internal Medicine

## 2017-09-21 ENCOUNTER — Other Ambulatory Visit: Payer: Self-pay | Admitting: Internal Medicine

## 2017-09-21 MED ORDER — RIFAMPIN 300 MG PO CAPS: 300.0000 mg | ORAL_CAPSULE | Freq: Two times a day (BID) | ORAL | 1 refills | Status: AC

## 2017-10-19 ENCOUNTER — Ambulatory Visit: Payer: BC Managed Care – PPO | Admitting: Internal Medicine

## 2017-12-15 ENCOUNTER — Ambulatory Visit: Payer: BC Managed Care – PPO | Admitting: Podiatry

## 2017-12-15 ENCOUNTER — Encounter: Payer: Self-pay | Admitting: Podiatry

## 2017-12-15 DIAGNOSIS — E0842 Diabetes mellitus due to underlying condition with diabetic polyneuropathy: Secondary | ICD-10-CM

## 2017-12-15 DIAGNOSIS — M79676 Pain in unspecified toe(s): Secondary | ICD-10-CM | POA: Diagnosis not present

## 2017-12-15 DIAGNOSIS — B351 Tinea unguium: Secondary | ICD-10-CM

## 2017-12-15 NOTE — Progress Notes (Signed)
   SUBJECTIVE 47 year old male with a history of diabetes mellitus and right BKA presents to office today complaining of elongated, thickened nails that cause pain while ambulating in shoes. He is unable to trim his own nails. Patient is here for further evaluation and treatment.   Past Medical History:  Diagnosis Date  . Diabetic peripheral neuropathy (HCC)   . Hyperlipidemia   . Hypertension   . Type I diabetes mellitus (HCC)     OBJECTIVE General Patient is awake, alert, and oriented x 3 and in no acute distress. Derm Skin is dry and supple left. Negative open lesions or macerations. Remaining integument unremarkable. Nails are tender, long, thickened and dystrophic with subungual debris, consistent with onychomycosis, 1-5 left. No signs of infection noted. Vasc  DP and PT pedal pulses palpable left. Temperature gradient within normal limits.  Neuro Epicritic and protective threshold sensation diminished left.  Musculoskeletal Exam No symptomatic pedal deformities noted left. Muscular strength within normal limits.  ASSESSMENT 1. Diabetes Mellitus w/ peripheral neuropathy 2. Onychomycosis of nail due to dermatophyte left 3. Pain in foot left 4. H/o BKA RLE  PLAN OF CARE 1. Patient evaluated today. 2. Instructed to maintain good pedal hygiene and foot care. Stressed importance of controlling blood sugar.  3. Mechanical debridement of nails 1-5 of the left foot performed using a nail nipper. Filed with dremel without incident.  4. Return to clinic in 3 mos.     Felecia ShellingBrent M. Shye Doty, DPM [facility]  Dr. Felecia ShellingBrent M. Asbury Hair, DPM    [address]                                        [place], [place] [address]                Office [phone]  Fax [phone]

## 2018-03-19 ENCOUNTER — Encounter: Payer: BC Managed Care – PPO | Admitting: Podiatry

## 2018-03-23 NOTE — Progress Notes (Signed)
This encounter was created in error - please disregard.

## 2018-07-04 IMAGING — CT CT RENAL STONE PROTOCOL
2 of 4 series · 15 of 46 positions shown, 17 images · non-contrast
Comparison: None.

CLINICAL DATA: Initial evaluation for reason cystitis, with urinary
retention.

EXAM:
CT ABDOMEN AND PELVIS WITHOUT CONTRAST
TECHNIQUE: Multidetector CT imaging of the abdomen and pelvis was performed
following the standard protocol without IV contrast.

[Series 2: stone full standard · axial · 0.89mm/px · z∈[-772,-247]mm · 12 of 117 slices shown, 14 images]
[im 6/117  soft-tissue]
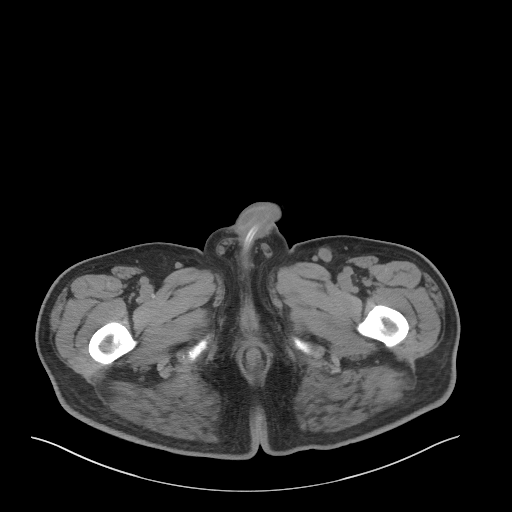
[im 6/117  bone]
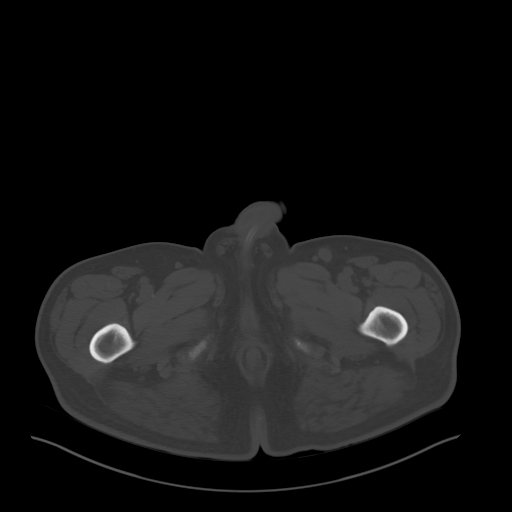
[im 16/117  soft-tissue]
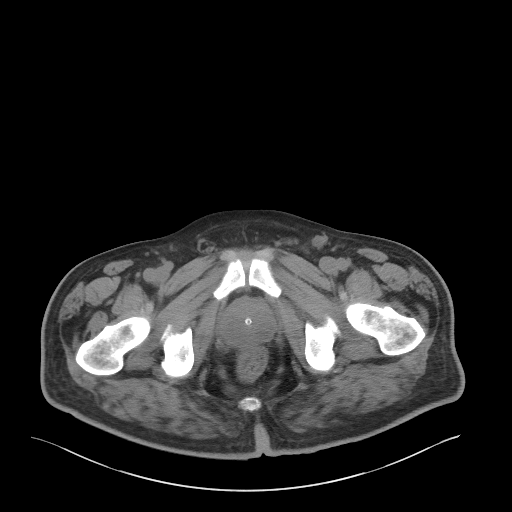
[im 26/117  soft-tissue]
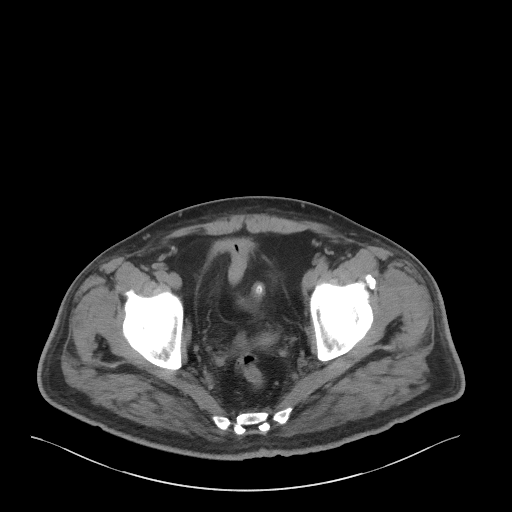
[im 36/117  soft-tissue]
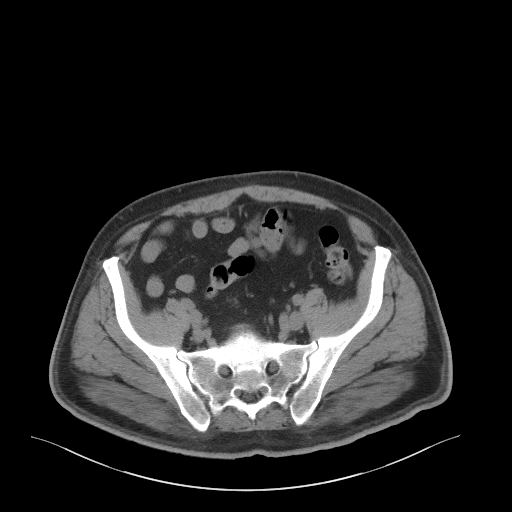
[im 46/117  soft-tissue]
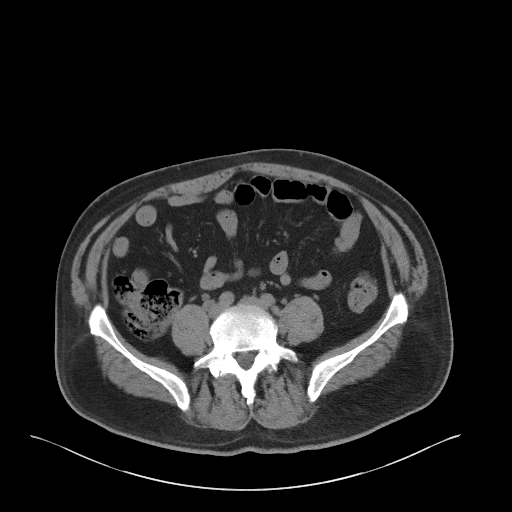
[im 56/117  soft-tissue]
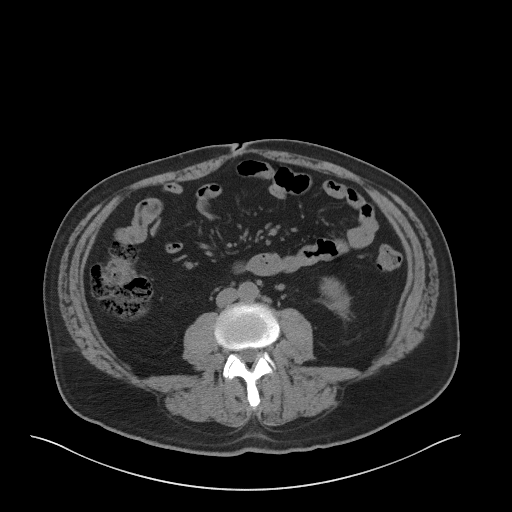
[im 61/117  soft-tissue]
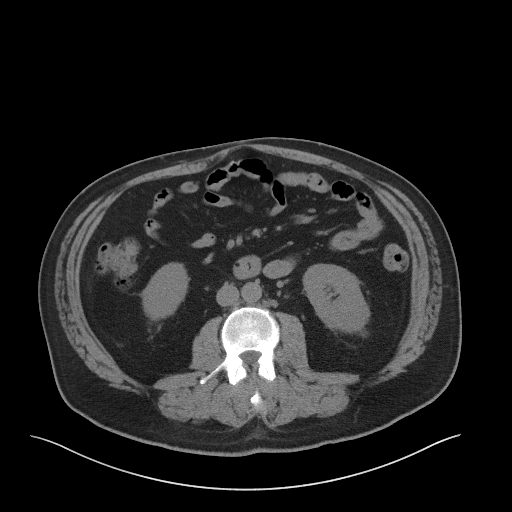
[im 71/117  soft-tissue]
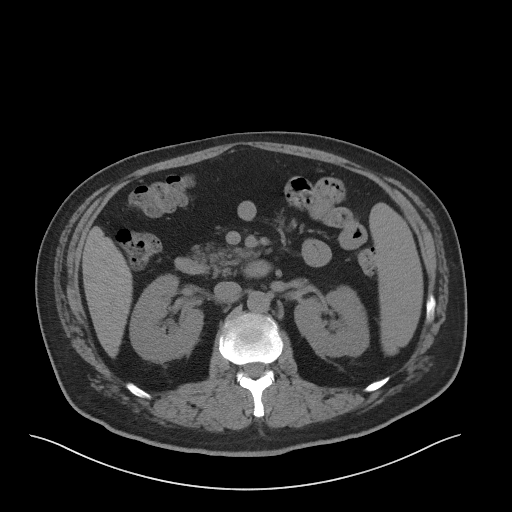
[im 81/117  soft-tissue]
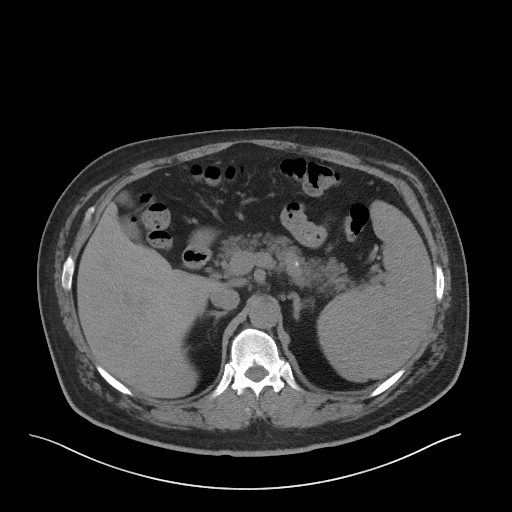
[im 81/117  bone]
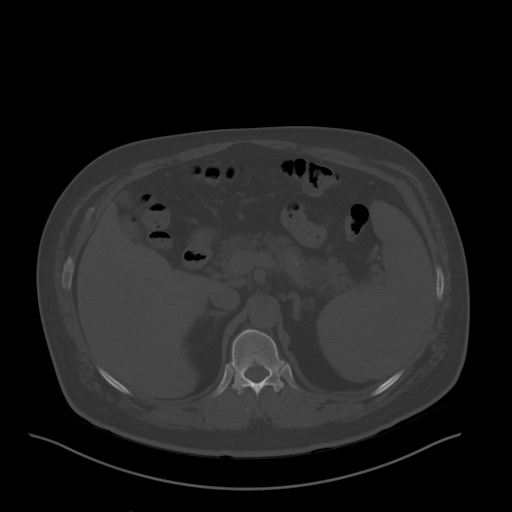
[im 91/117  soft-tissue]
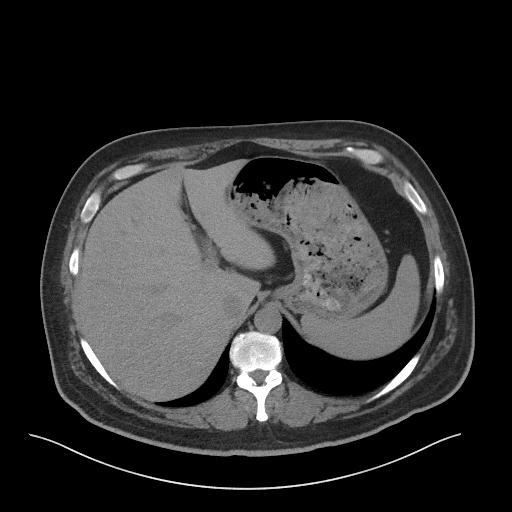
[im 101/117  soft-tissue]
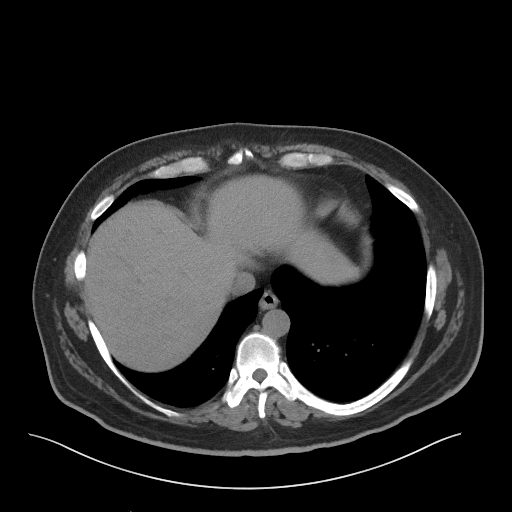
[im 111/117  soft-tissue]
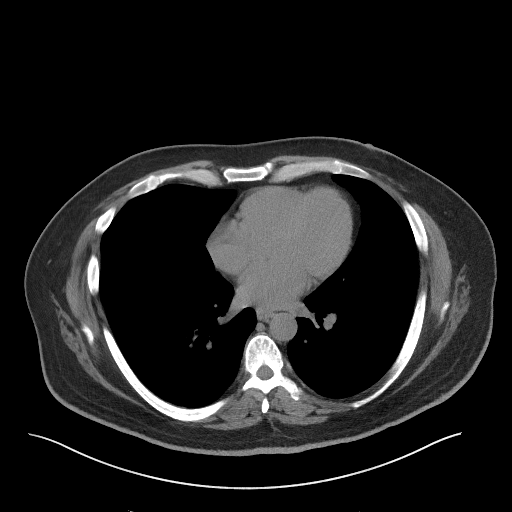

[Series 5: coronal · coronal · 0.86mm/px · 3 of 142 slices shown]
[im 48/142  soft-tissue]
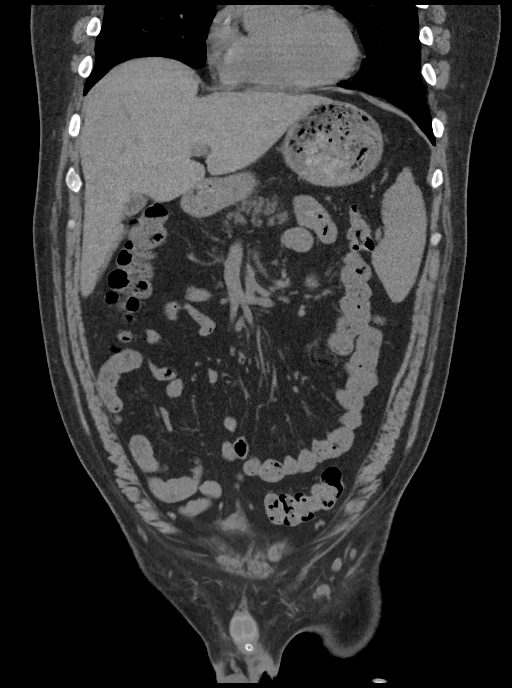
[im 63/142  soft-tissue]
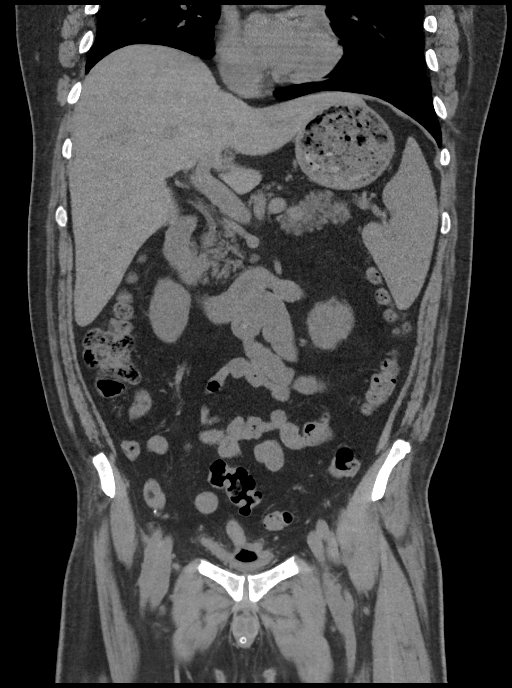
[im 79/142  soft-tissue]
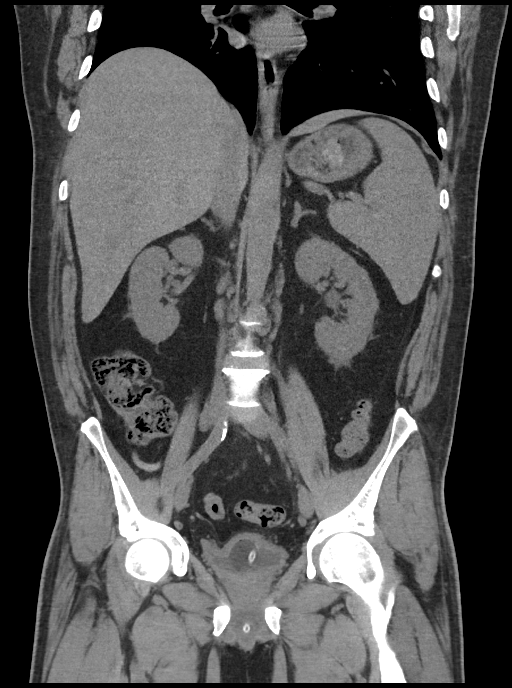

[15 of 46 positions shown; findings below may reference images not displayed]

FINDINGS: Lower chest: Visualized lung bases are clear. Coronary artery
calcifications partially visualized.

Hepatobiliary: Liver demonstrates a normal unenhanced appearance.
Gallbladder within normal limits. No biliary dilatation.

Pancreas: Punctate calcification noted within the pancreatic body.
Pancreas otherwise unremarkable.

Spleen: Granuloma noted within the spleen. Spleen otherwise
unremarkable.

Adrenals/Urinary Tract: Adrenal glands are normal. Kidneys equal in
size without evidence for nephrolithiasis or hydronephrosis. No
radiopaque calculi seen along the course of either ureter. No
hydroureter.

Bladder decompressed with a Foley catheter in place. Gas lucency
within the bladder lumen likely related to catheterization.
Circumferential wall thickening with mild adjacent hazy stranding,
may be related incomplete distension and/or cystitis. No layering
stones within the bladder lumen.

Stomach/Bowel: Stomach unremarkable. No evidence for bowel
obstruction. Appendix normal. No acute inflammatory changes about
the bowels.

Vascular/Lymphatic: Intra- abdominal aorta of normal caliber. Shotty
subcentimeter retroperitoneal lymph nodes noted. No pathologically
enlarged intra-abdominal or pelvic lymph nodes identified.

Reproductive: Prostate within normal limits.

Other: No free air or fluid.

Musculoskeletal: No acute osseous abnormality. No worrisome lytic or
blastic osseous lesions.
IMPRESSION: 1. Bladder decompressed with a Foley catheter in place.
Circumferential bladder wall thickening with mild adjacent hazy
stranding, may be related incomplete distension, catheterization,
and/or cystitis.
2. No CT evidence for nephrolithiasis or obstructive uropathy. No
hydroureteronephrosis.
3. No other acute intra-abdominopelvic process.

## 2018-08-07 IMAGING — MR MR FOOT*R* W/O CM
6 of 7 series · 34 of 40 positions shown · non-contrast
Comparison: None.

CLINICAL DATA: Patient ambulatory to triage with steady gait,
without difficulty or distress noted. Pt reports currently being tx
for cellulitis of right ankle. Has been taking bactrim x 2wks but
having increasing pain.

EXAM:
MRI OF THE RIGHT FOREFOOT WITHOUT CONTRAST
TECHNIQUE: Multiplanar, multisequence MR imaging of the right foot was
performed. No intravenous contrast was administered.

[Series 4: T1 · axial · 3.0mm · 0.84mm/px · z∈[-64,+62]mm · 5 of 40 slices shown (1 of 3)]
[im 1/40]
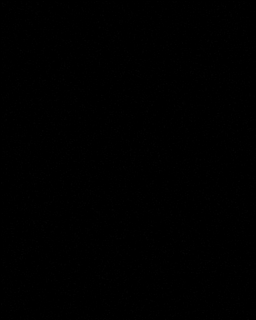
[im 10/40]
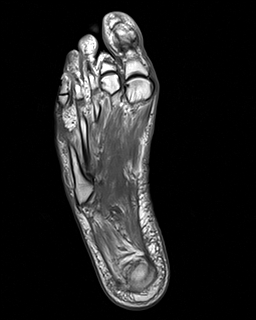
[im 20/40]
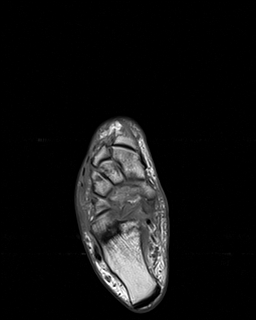
[im 30/40]
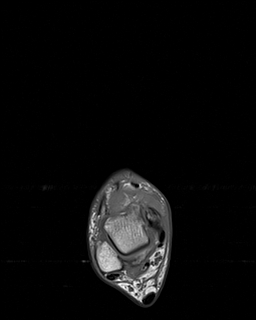
[im 40/40]
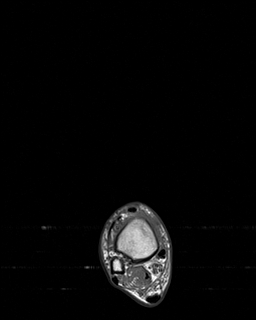

[Series 5: T1 · coronal · 3.0mm · 0.75mm/px · 6 of 40 slices shown (2 of 3)]
[im 1/40]
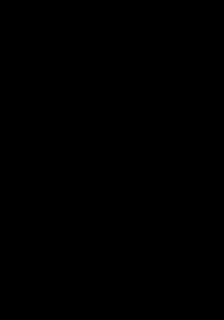
[im 8/40]
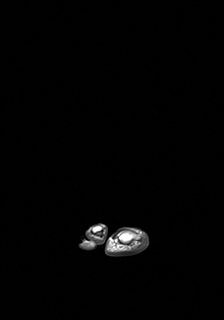
[im 16/40]
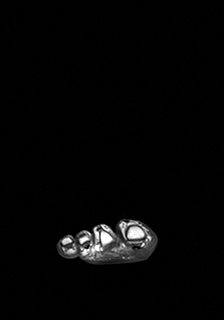
[im 24/40]
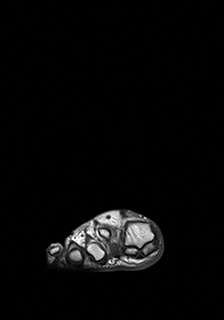
[im 32/40]
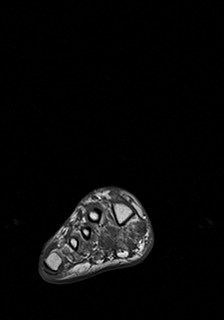
[im 40/40]
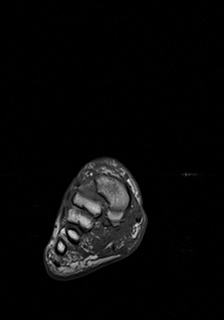

[Series 6: T1 · coronal · 3.0mm · 0.75mm/px · 6 of 40 slices shown (3 of 3)]
[im 1/40]
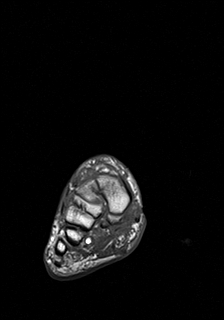
[im 8/40]
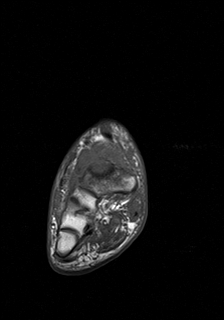
[im 16/40]
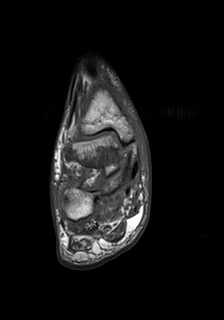
[im 24/40]
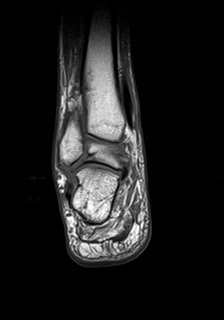
[im 32/40]
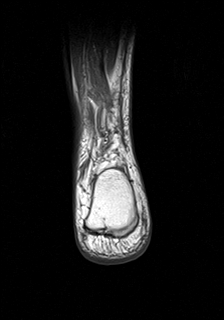
[im 40/40]
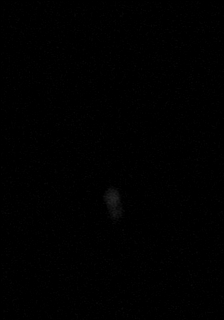

[Series 7: T2 fat-sat · coronal · 3.0mm · 0.43mm/px · 6 of 40 slices shown (1 of 2)]
[im 1/40]
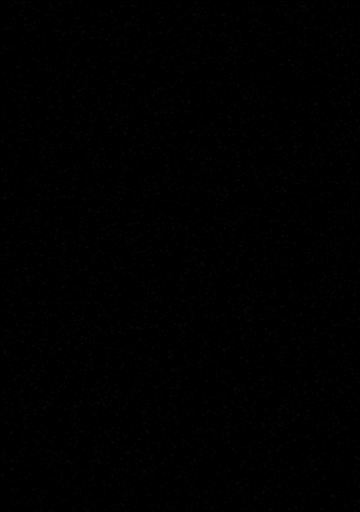
[im 8/40]
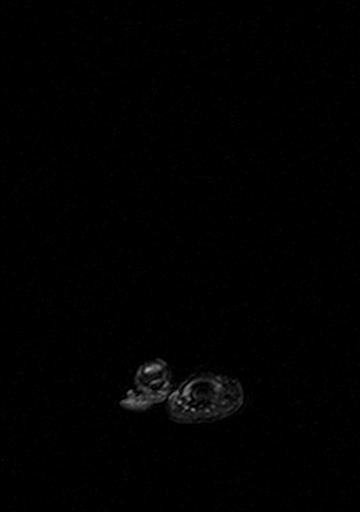
[im 16/40]
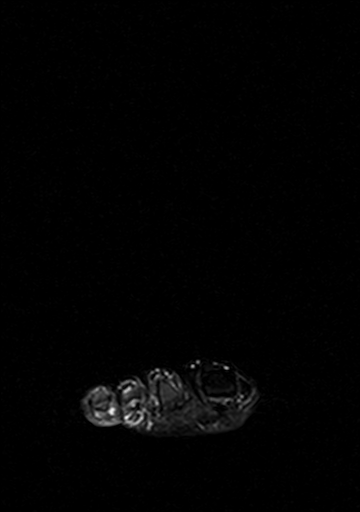
[im 24/40]
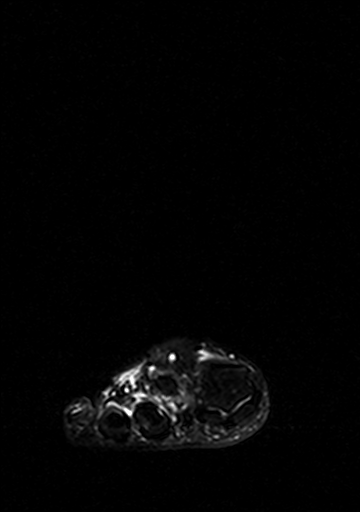
[im 32/40]
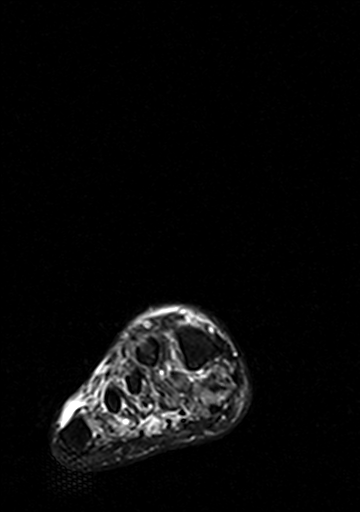
[im 40/40]
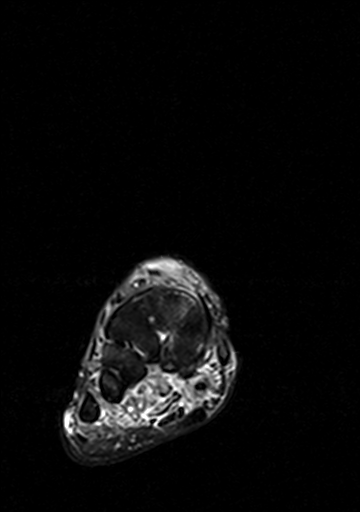

[Series 8: STIR · sagittal · 3.0mm · 0.53mm/px · 5 of 33 slices shown]
[im 1/33]
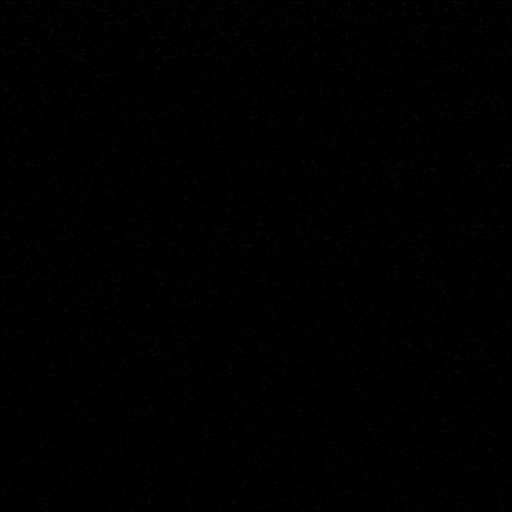
[im 9/33]
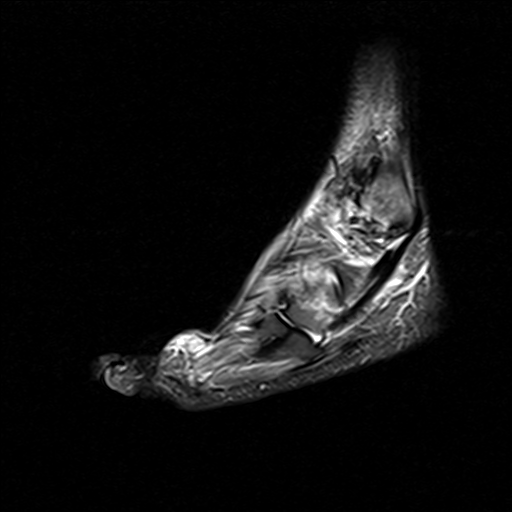
[im 17/33]
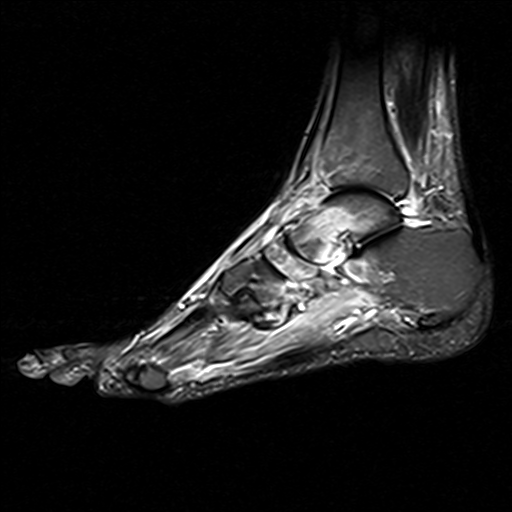
[im 25/33]
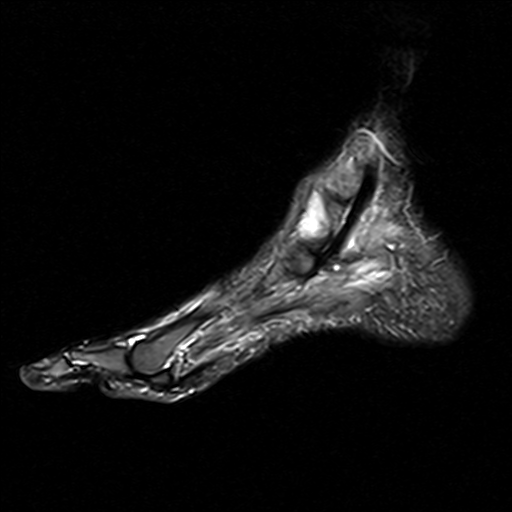
[im 33/33]
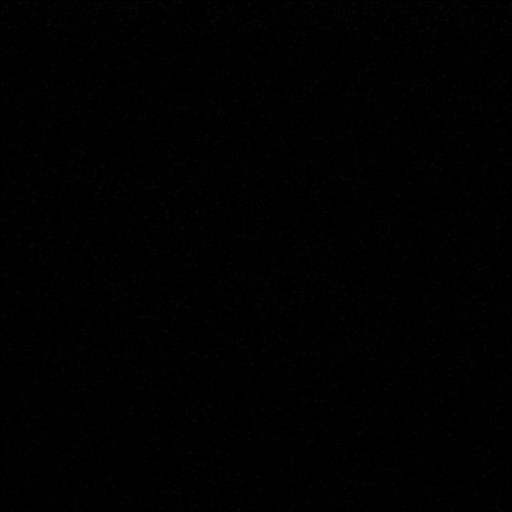

[Series 9: T2 fat-sat · coronal · 3.0mm · 0.43mm/px · 6 of 40 slices shown (2 of 2)]
[im 1/40]
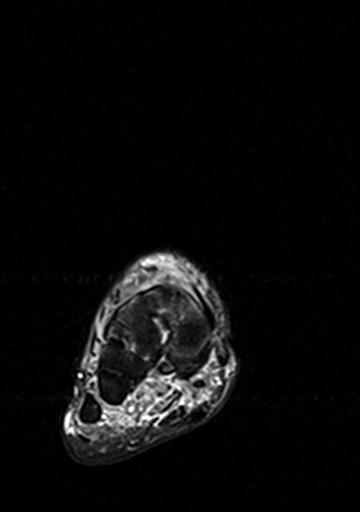
[im 8/40]
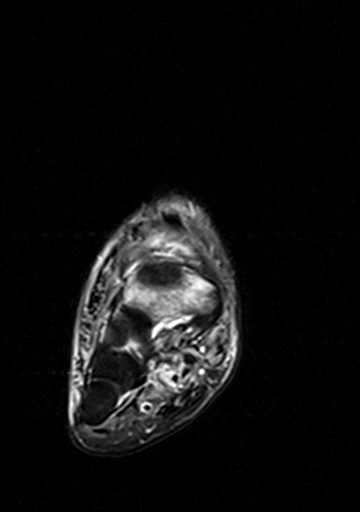
[im 16/40]
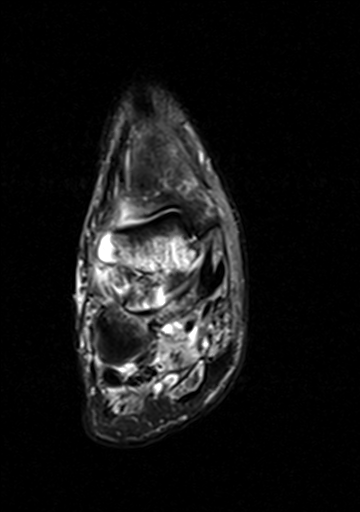
[im 24/40]
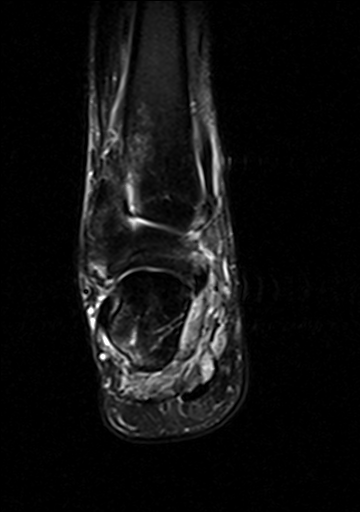
[im 32/40]
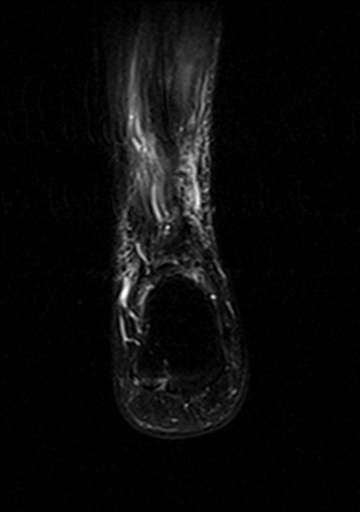
[im 40/40]
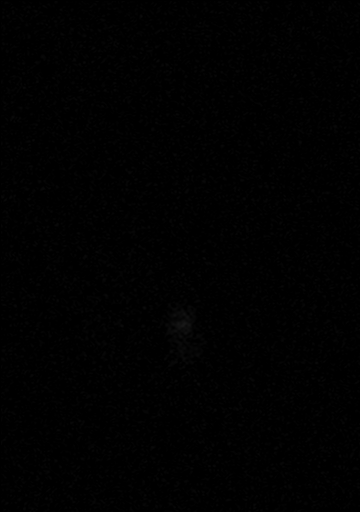

[34 of 40 positions shown; findings below may reference images not displayed]

FINDINGS: TENDONS

Peroneal: Peroneal longus tendon intact. Peroneal brevis intact.

Posteromedial: Posterior tibial tendon intact. Flexor hallucis
longus tendon intact. Flexor digitorum longus tendon intact.

Anterior: Tibialis anterior tendon intact. Extensor hallucis longus
tendon intact Extensor digitorum longus tendon intact.

Achilles:  Intact.

Plantar Fascia: Intact.

LIGAMENTS

Lateral: Anterior talofibular ligament intact. Posterior talofibular
ligament intact. Anterior and posterior tibiofibular ligaments
intact.

Medial: Deltoid ligament intact. Spring ligament intact.

CARTILAGE

Ankle Joint: No joint effusion. Normal ankle mortise. No chondral
defect.

Subtalar Joints/Sinus Tarsi: Normal subtalar joints. No subtalar
joint effusion. Normal sinus tarsi.

Bones: Mild osteoarthritis of the first MTP joint. Severe marrow
edema and areas of serpiginous T1 low signal involving the anterior
talus and the navicular with a small talonavicular joint effusion.
Mild marrow edema in the middle facet of the calcaneus. Mild
nonspecific T2 hyperintense signal in the distal fibula and tibia.

Soft Tissue: No fluid collection or hematoma. Soft tissue edema
throughout the plantar musculature likely neurogenic given the
patient's history of diabetes.
IMPRESSION: 1. Severe marrow edema and areas of serpiginous T1 low signal
involving the anterior talus and the navicular with a small
talonavicular joint effusion. The appearance is nonspecific.
Differential considerations include avascular necrosis versus stress
related edema versus infection.
2. Mild marrow edema in the middle facet of the calcaneus which may
reactive given the adjacent changes.

## 2018-08-07 IMAGING — CR DG ANKLE COMPLETE 3+V*R*
3 series · 3 of 3 positions shown · non-contrast
Comparison: No recent prior.

CLINICAL DATA: Right foot and ankle swelling.  Diabetes.

EXAM:
RIGHT ANKLE - COMPLETE 3+ VIEW

[ankle ap]
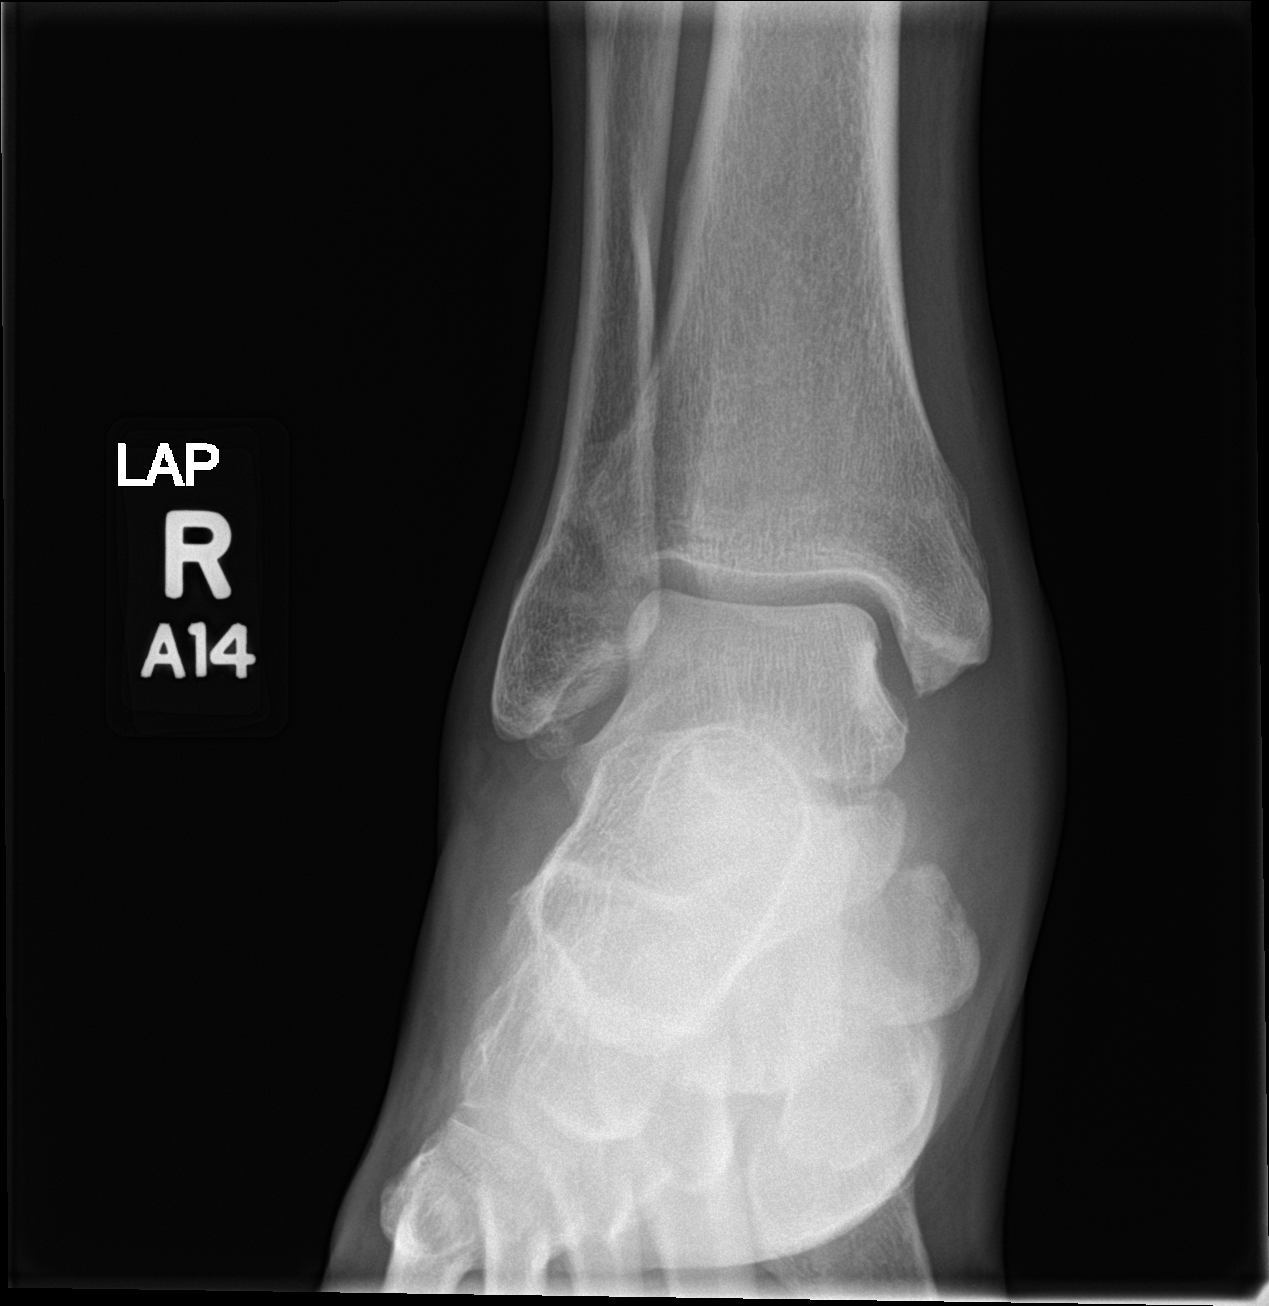

[ankle obl]
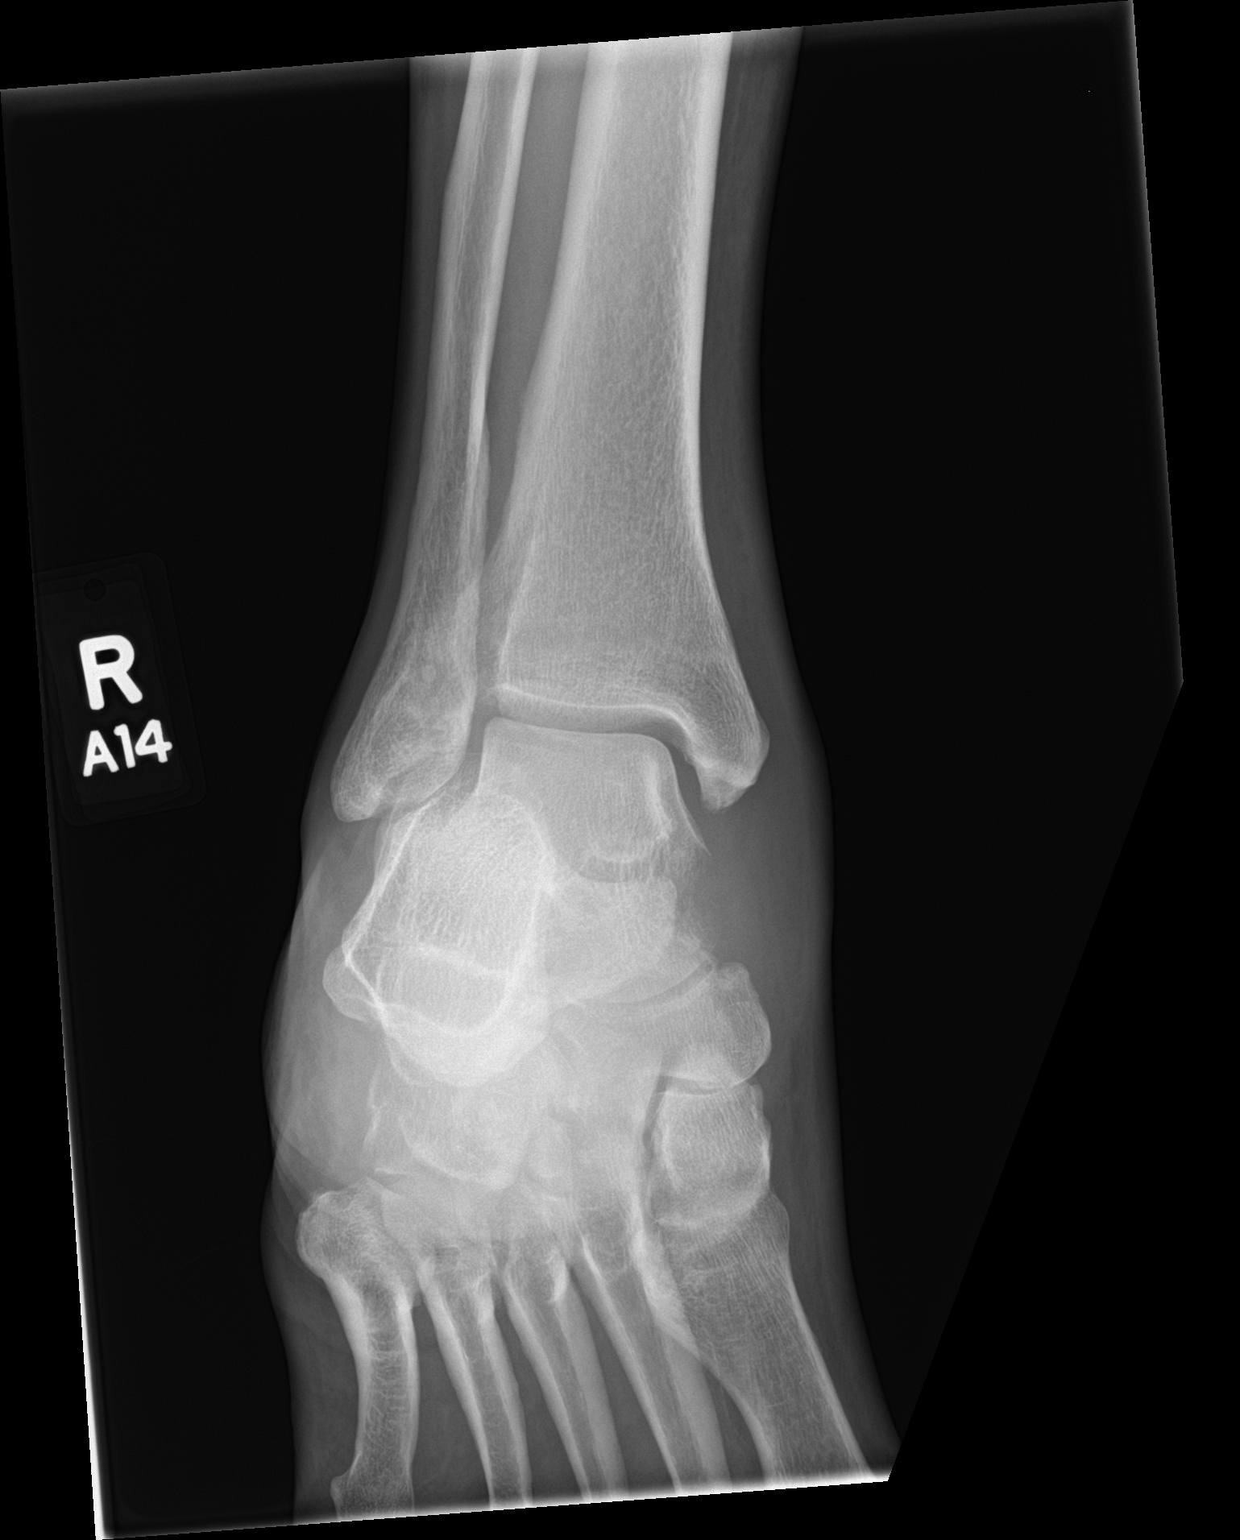

[ankle lat]
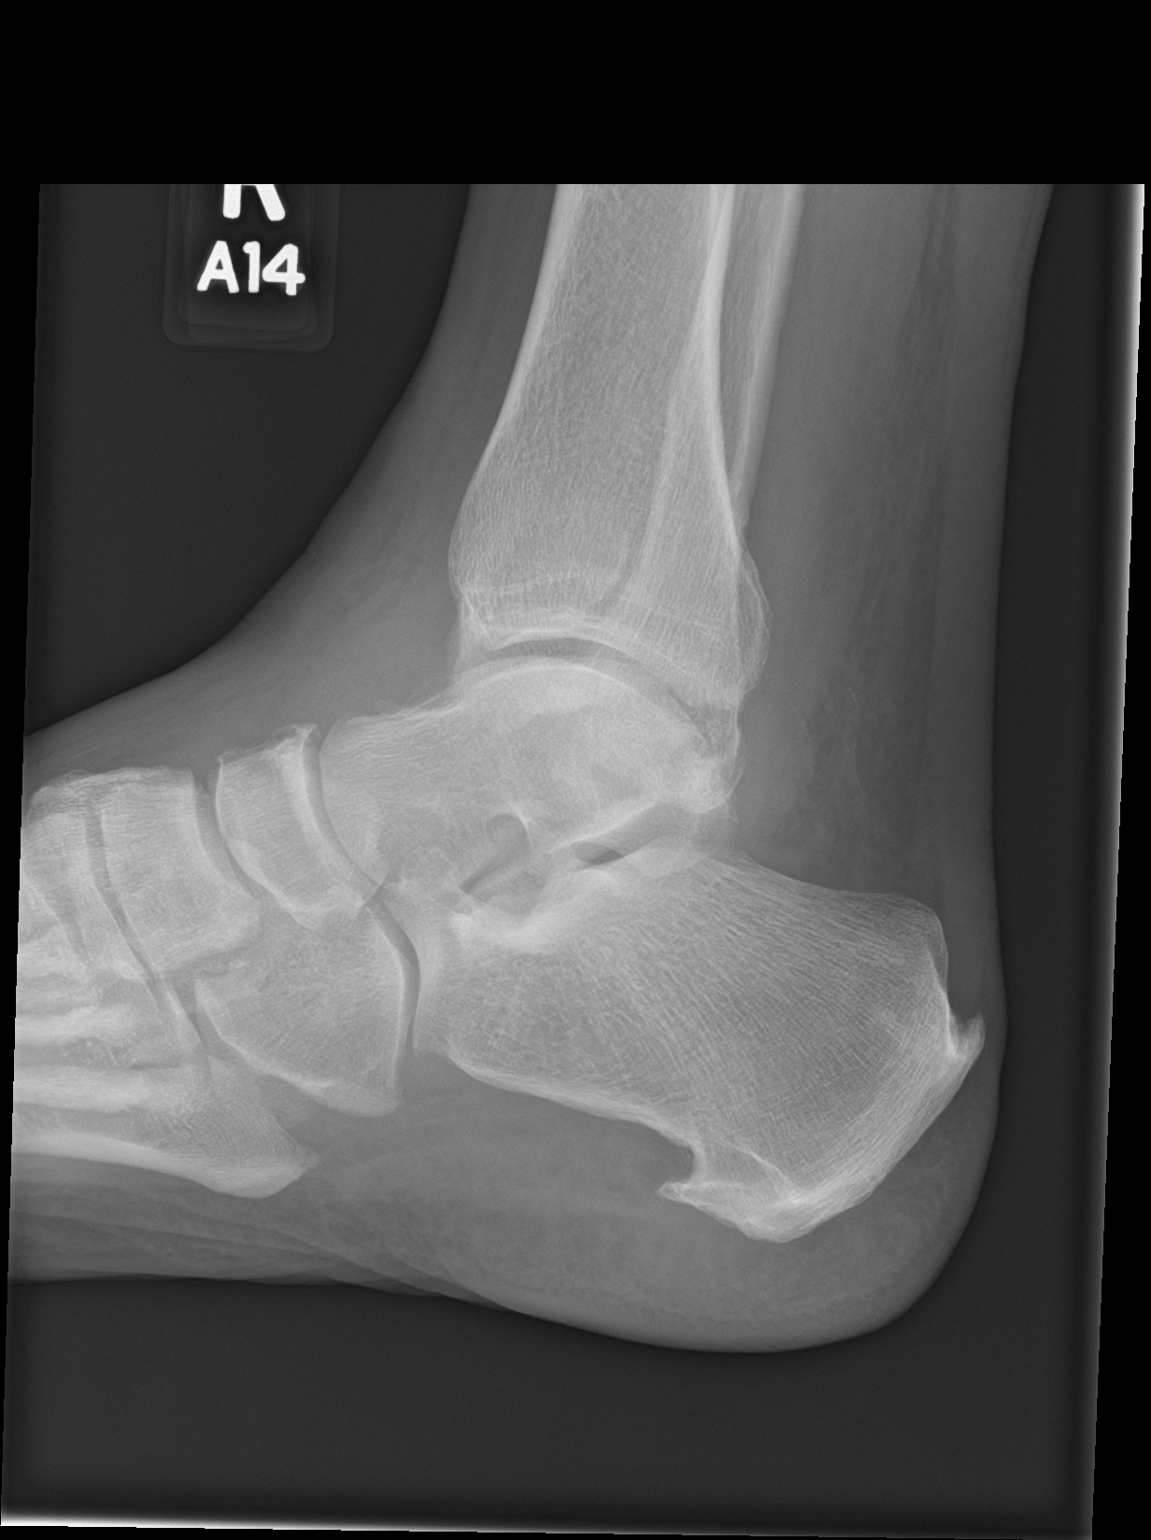

[3 of 3 positions shown; findings below may reference images not displayed]

FINDINGS: Diffuse soft tissue swelling. Diffuse degenerative change.
Corticated bony density noted adjacent to the lateral malleolus,
most likely an old fracture fragment. No evidence fracture or
dislocation.
IMPRESSION: Soft tissue swelling. Diffuse degenerative change. Old fracture
fragment lateral malleolus. No acute bony abnormality identified.

## 2018-08-17 ENCOUNTER — Ambulatory Visit (INDEPENDENT_AMBULATORY_CARE_PROVIDER_SITE_OTHER): Payer: BC Managed Care – PPO

## 2018-08-17 ENCOUNTER — Encounter: Payer: Self-pay | Admitting: Podiatry

## 2018-08-17 ENCOUNTER — Other Ambulatory Visit: Payer: Self-pay | Admitting: Podiatry

## 2018-08-17 ENCOUNTER — Encounter

## 2018-08-17 ENCOUNTER — Ambulatory Visit: Payer: BC Managed Care – PPO | Admitting: Podiatry

## 2018-08-17 DIAGNOSIS — E11610 Type 2 diabetes mellitus with diabetic neuropathic arthropathy: Secondary | ICD-10-CM

## 2018-08-17 DIAGNOSIS — M14672 Charcot's joint, left ankle and foot: Secondary | ICD-10-CM | POA: Diagnosis not present

## 2018-08-17 DIAGNOSIS — M779 Enthesopathy, unspecified: Secondary | ICD-10-CM

## 2018-08-17 DIAGNOSIS — E0842 Diabetes mellitus due to underlying condition with diabetic polyneuropathy: Secondary | ICD-10-CM

## 2018-08-17 MED ORDER — OXYCODONE-ACETAMINOPHEN 5-325 MG PO TABS: 1.0000 | ORAL_TABLET | Freq: Three times a day (TID) | ORAL | 0 refills | Status: AC | PRN

## 2018-08-20 ENCOUNTER — Ambulatory Visit: Payer: BC Managed Care – PPO | Admitting: Podiatry

## 2018-08-22 NOTE — Progress Notes (Signed)
   HPI: 48 year old male h/o diabetes mellitus and right BKA presents the office today for evaluation of a painful red swollen left foot.  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  He is been having significant amount of pain to the left foot for the past several months now.  Currently the pain is 7/10 on the pain scale.  He says he is noticed over the past month that his arch has collapsed significantly.  He is concerned because he starting to get some rubbing irritation to the skin.  He presents today for further treatment evaluation  Past Medical History:  Diagnosis Date  . Diabetic peripheral neuropathy (HCC)   . Hyperlipidemia   . Hypertension   . Type I diabetes mellitus (HCC)      Physical Exam: General: The patient is alert and oriented x3 in no acute distress.  Dermatology: Skin is warm, dry and supple bilateral lower extremities. Negative for open lesions or macerations.  Currently is a very superficial break in the skin, not through the dermis to the medial aspect of the left foot.  Vascular: Palpable pedal pulses bilaterally.  There is a significant amount of erythema with edema noted to the left foot and ankle.  Neurological: Epicritic and protective threshold grossly intact bilaterally.   Musculoskeletal Exam: History of BKA RLE.  Medial longitudinal arch collapse with osseous protrusion noted to the medial aspect of the left foot.  Significant pain on palpation throughout the left foot as well.  Muscle strength 5/5 all muscle compartments left lower extremity.  Radiographic Exam:  Extensive midfoot joint destruction noted with fragmentation of the navicular and displacement.  Collapse the medial longitudinal arch.  All findings consistent with acute Charcot neuroarthropathy left lower extremity.  Assessment: 1. H/o BKA RLE 2. DM w/ polyneuropathy 3.  Acute Charcot neuroarthropathy LLE 4.  Pain in left foot   Plan of Care:  1. Patient  evaluated. X-Rays reviewed.  2.  Today I am going to refer the patient to [facility], Dr. Valetta Mole, for probable surgical consultation regarding the acute Charcot.  Patient understands that he is at high risk for limb loss nail to the left lower extremity due to the current symptoms. 3.  Regarding pain, I am going to go ahead and prescribe Percocet 5/325 mg 4.  Patient has an immobilization cam boot at home.  He needs to be strictly nonweightbearing left lower extremity, until instructed otherwise.  Patient understands he is not able to work at this time due to his current symptoms and high risk of limb loss. 5.  Return to clinic as needed      Felecia Shelling, DPM [facility]  Dr. Felecia Shelling, DPM    [address] [place], [place] [address]                Office [phone]  Fax [phone]

## 2019-04-07 ENCOUNTER — Other Ambulatory Visit: Payer: Self-pay

## 2019-04-07 ENCOUNTER — Encounter: Payer: Self-pay | Admitting: *Deleted

## 2019-04-07 ENCOUNTER — Emergency Department
Admission: EM | Admit: 2019-04-07 | Discharge: 2019-04-07 | Disposition: A | Discharge status: Home / Self Care | Payer: BC Managed Care – PPO | Attending: Emergency Medicine | Admitting: Emergency Medicine

## 2019-04-07 DIAGNOSIS — Z89511 Acquired absence of right leg below knee: Secondary | ICD-10-CM | POA: Diagnosis not present

## 2019-04-07 DIAGNOSIS — F329 Major depressive disorder, single episode, unspecified: Secondary | ICD-10-CM | POA: Diagnosis present

## 2019-04-07 DIAGNOSIS — Z7982 Long term (current) use of aspirin: Secondary | ICD-10-CM | POA: Insufficient documentation

## 2019-04-07 DIAGNOSIS — E1042 Type 1 diabetes mellitus with diabetic polyneuropathy: Secondary | ICD-10-CM | POA: Diagnosis present

## 2019-04-07 DIAGNOSIS — H4720 Unspecified optic atrophy: Secondary | ICD-10-CM | POA: Diagnosis present

## 2019-04-07 DIAGNOSIS — D638 Anemia in other chronic diseases classified elsewhere: Secondary | ICD-10-CM | POA: Diagnosis present

## 2019-04-07 DIAGNOSIS — E7800 Pure hypercholesterolemia, unspecified: Secondary | ICD-10-CM | POA: Diagnosis present

## 2019-04-07 DIAGNOSIS — F339 Major depressive disorder, recurrent, unspecified: Secondary | ICD-10-CM | POA: Diagnosis present

## 2019-04-07 DIAGNOSIS — Z79899 Other long term (current) drug therapy: Secondary | ICD-10-CM | POA: Diagnosis not present

## 2019-04-07 DIAGNOSIS — N179 Acute kidney failure, unspecified: Secondary | ICD-10-CM | POA: Diagnosis present

## 2019-04-07 DIAGNOSIS — Z9114 Patient's other noncompliance with medication regimen: Secondary | ICD-10-CM | POA: Diagnosis not present

## 2019-04-07 DIAGNOSIS — E08621 Diabetes mellitus due to underlying condition with foot ulcer: Secondary | ICD-10-CM | POA: Diagnosis present

## 2019-04-07 DIAGNOSIS — E103392 Type 1 diabetes mellitus with moderate nonproliferative diabetic retinopathy without macular edema, left eye: Secondary | ICD-10-CM | POA: Diagnosis present

## 2019-04-07 DIAGNOSIS — Z046 Encounter for general psychiatric examination, requested by authority: Secondary | ICD-10-CM | POA: Diagnosis not present

## 2019-04-07 DIAGNOSIS — H2513 Age-related nuclear cataract, bilateral: Secondary | ICD-10-CM | POA: Diagnosis present

## 2019-04-07 DIAGNOSIS — M009 Pyogenic arthritis, unspecified: Secondary | ICD-10-CM | POA: Diagnosis present

## 2019-04-07 DIAGNOSIS — I10 Essential (primary) hypertension: Secondary | ICD-10-CM | POA: Diagnosis present

## 2019-04-07 DIAGNOSIS — E103591 Type 1 diabetes mellitus with proliferative diabetic retinopathy without macular edema, right eye: Secondary | ICD-10-CM | POA: Diagnosis present

## 2019-04-07 DIAGNOSIS — E875 Hyperkalemia: Secondary | ICD-10-CM | POA: Diagnosis present

## 2019-04-07 DIAGNOSIS — D649 Anemia, unspecified: Secondary | ICD-10-CM | POA: Diagnosis present

## 2019-04-07 DIAGNOSIS — E119 Type 2 diabetes mellitus without complications: Secondary | ICD-10-CM | POA: Diagnosis not present

## 2019-04-07 DIAGNOSIS — G629 Polyneuropathy, unspecified: Secondary | ICD-10-CM

## 2019-04-07 DIAGNOSIS — K219 Gastro-esophageal reflux disease without esophagitis: Secondary | ICD-10-CM | POA: Diagnosis present

## 2019-04-07 DIAGNOSIS — M86171 Other acute osteomyelitis, right ankle and foot: Secondary | ICD-10-CM | POA: Diagnosis present

## 2019-04-07 DIAGNOSIS — N400 Enlarged prostate without lower urinary tract symptoms: Secondary | ICD-10-CM | POA: Diagnosis present

## 2019-04-07 LAB — GLUCOSE, CAPILLARY: Glucose-Capillary: 311 mg/dL — ABNORMAL HIGH (ref 70–99)

## 2019-04-07 NOTE — ED Notes (Signed)
Patient is stable in NAD. He is discharged home via wife. Discharged instruction and follow up care reviewed. Patient verbalized understanding. No issues.

## 2019-04-07 NOTE — ED Notes (Signed)
VOL  pending  consult 

## 2019-04-07 NOTE — Discharge Instructions (Signed)

## 2019-04-07 NOTE — ED Notes (Signed)
Hourly rounding reveals patient in room. No complaints, stable, in no acute distress. Q15 minute rounds and monitoring via Rover and Officer to continue.   
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
## 2019-04-07 NOTE — ED Notes (Addendum)
Patient given a boxed meal and a diet drink. Awaiting TTS consult and plan of care.
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
## 2019-04-07 NOTE — ED Notes (Signed)
ED Provider at bedside. 

## 2019-04-07 NOTE — ED Provider Notes (Signed)
[facility] Emergency Department Provider Note   ____________________________________________   First MD Initiated Contact with Patient 04/07/19 1738     (approximate)  I have reviewed the triage vital signs and the nursing notes.   HISTORY  Chief Complaint Medical Clearance and Behavior Problem    HPI Keith [name] is a 48 y.o. male   here for evaluation with the police on a voluntary basis  Patient reports he recently had surgery left lower leg due to diabetes.  He is been recovering well, but he reports the last 3 days he has been at home and he and his wife have just not been getting along in general.  Denies any desire to hurt himself or anyone else.  Reports he just needed to get out of the house for a while, got in his scooter and [place] away from home.  He reports he is gone for several hours, and it was reported that he was missing.  When he returned to his house or multiple police officers and he reports they offered to bring him here for evaluation.  Denies any hallucinations, desire to hurt himself or anyone else, or any suicidal intent.  Does not want her anyone else  Patient reports he does take medication breaks from his diabetes medicine, and the last couple of days he is not taking his medication including insulin and then he will go back on it in another day or so.  Past Medical History:  Diagnosis Date  . Diabetic peripheral neuropathy (HCC)   . Hyperlipidemia   . Hypertension   . Type I diabetes mellitus [facility](HCC)     Patient Active Problem List   Diagnosis Date Noted  . Hypertension 03/10/2017  . Acute kidney injury (HCC) 03/10/2017  . Acute hyperkalemia 03/10/2017  . Anemia, chronic disease 03/10/2017  . Age-related nuclear cataract of both eyes 02/16/2017  . Moderate nonproliferative diabetic retinopathy of left eye without macular edema associated with type 1 diabetes  mellitus (HCC) 02/16/2017  . 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 Proliferative diabetic retinopathy of right eye without macular edema associated with type 1 diabetes mellitus (HCC) 02/16/2017  . Septic arthritis of ankle or foot (HCC) 01/09/2017  . Type II diabetes mellitus (HCC) 01/09/2017  . HTN (hypertension) 01/09/2017  . Neuropathy 01/09/2017  . BPH (benign prostatic hyperplasia) 01/09/2017  . Anemia 12/26/2016  . Osteomyelitis of ankle, right, acute (HCC) 11/27/2016  . Diabetic ulcer of right foot associated with diabetes mellitus due to underlying condition (HCC) 03/31/2016  . LPRD (laryngopharyngeal reflux disease) 09/19/2014  . Hypercholesterolemia 08/01/2014  . Diabetic polyneuropathy associated with type 1 diabetes mellitus (HCC) 07/24/2014    Past Surgical History:  Procedure Laterality Date  . BONE BIOPSY Right 03/19/2017   Procedure: BONE BIOPSY;  Surgeon: Felecia ShellingEvans, Brent M, DPM;  Location: [facility] OR;  Service: Podiatry;  Laterality: Right;  . I&D EXTREMITY Right 03/12/2017   Procedure: IRRIGATION AND DEBRIDEMENT RIGHT ANKLE;  Surgeon: Felecia ShellingEvans, Brent M, DPM;  Location: [facility] OR;  Service: Podiatry;  Laterality: Right;  . IR FLUORO GUIDE CV LINE RIGHT  01/13/2017  . IR US GUIDE VASC ACCESS RIGHT  01/13/2017  . IRRIGATION AND DEBRIDEMENT FOOT Left 04/02/2016   Procedure: IRRIGATION AND DEBRIDEMENT FOOT;  Surgeon: Gwyneth RevelsJustin Fowler, DPM;  Location: [facility] ORS;  Service: Podiatry;  Laterality: Left;  . IRRIGATION AND DEBRIDEMENT FOOT Right 03/19/2017   Procedure:  IRRIGATION AND DEBRIDEMENT RIGHT SEPTIC ANKLE;  Surgeon: Felecia Shelling, DPM;  Location: [facility] OR;  Service: Podiatry;  Laterality: Right;    Prior to Admission medications   Medication Sig Start Date End Date Taking? Authorizing Provider  aspirin 81 MG tablet Take 81 mg by mouth daily.    [provider]  ferrous sulfate 325 (65 FE) MG tablet Take 325 mg by mouth 2 (two) times daily with a meal.  01/21/17 01/21/18  [provider]  gabapentin (NEURONTIN) 600 MG tablet Take 1,800 mg by mouth 3 (three) times daily.     [provider]  hydrochlorothiazide (HYDRODIURIL) 12.5 MG tablet  10/16/17   [provider]  ibuprofen (ADVIL,MOTRIN) 800 MG tablet Take 1 tablet (800 mg total) by mouth every 8 (eight) hours as needed. 12/12/16   Felecia Shelling, DPM  insulin aspart (NOVOLOG) 100 UNIT/ML injection Inject 10 Units into the skin 3 (three) times daily before meals. Per sliding scale Patient taking differently: Inject 15 Units into the skin 3 (three) times daily before meals. Per sliding scale 11/28/16   Katha Hamming, MD  Insulin Glargine (BASAGLAR KWIKPEN) 100 UNIT/ML SOPN Inject 0.8 mLs (80 Units total) into the skin daily at 10 pm. 03/21/17   Rodolph Bong, MD  losartan (COZAAR) 50 MG tablet Take 50 mg by mouth daily.    [provider]  metFORMIN (GLUCOPHAGE) 500 MG tablet  07/24/17   [provider]  oxyCODONE-acetaminophen (PERCOCET) 5-325 MG tablet Take 1 tablet by mouth every 8 (eight) hours as needed for severe pain. 08/17/18   Felecia Shelling, DPM  PARoxetine (PAXIL) 20 MG tablet Take 20 mg by mouth daily.  01/21/17   [provider]  Probiotic Product (ALIGN PO) Take 1 capsule by mouth daily.    [provider]  tamsulosin (FLOMAX) 0.4 MG CAPS capsule TAKE ONE CAPSULE BY MOUTH DAILY 01/05/17   Michiel Cowboy A, PA-C    Allergies Daptomycin and Lisinopril  Family History  Problem Relation Age of Onset  . Prostate cancer Father   . Bladder Cancer Neg Hx   . Kidney cancer Neg Hx     Social History Social History   Tobacco Use  . Smoking status: Never Smoker  . Smokeless tobacco: Never Used  Substance Use Topics  . Alcohol use: No    Alcohol/week: 0.0 standard drinks  . Drug use: No    Review of Systems Constitutional: No fever/chills Eyes: No visual changes. ENT: No sore throat. Cardiovascular: Denies chest pain. Respiratory:  Denies shortness of breath. Gastrointestinal: No abdominal pain.   Genitourinary: Negative for dysuria. Musculoskeletal: Negative for back pain. Skin: Negative for rash. Neurological: Negative for headaches, areas of focal weakness or numbness.    ____________________________________________   PHYSICAL EXAM:  VITAL SIGNS: ED Triage Vitals  Enc Vitals Group     BP 04/07/19 1710 (!) 141/96     Pulse Rate 04/07/19 1710 100     Resp 04/07/19 1719 16     Temp 04/07/19 1710 98.7 F (37.1 C)     Temp Source 04/07/19 1710 Oral     SpO2 04/07/19 1710 98 %     Weight --      Height --      Head Circumference --      Peak Flow --      Pain Score --      Pain Loc --      Pain Edu? --  Excl. in [place]? --     Constitutional: Alert and oriented. Well appearing and in no acute distress. Eyes: Conjunctivae are normal. Head: Atraumatic. Nose: No congestion/rhinnorhea. Mouth/Throat: Mucous membranes are moist. Neck: No stridor.  Cardiovascular: Normal rate, regular rhythm. Grossly normal heart sounds.  Good peripheral circulation. Respiratory: Normal respiratory effort.  No retractions. Lungs CTAB. Gastrointestinal: Soft and nontender. No distention. Musculoskeletal: Right lower extremity hold stump with prosthetic.  Left lower extremity bandage, clean dry. Neurologic:  Normal speech and language. No gross focal neurologic deficits are appreciated.  Skin:  Skin is warm, dry and intact. No rash noted. Psychiatric: Mood and affect are normal he denies any desire to harm himself or anyone else.  No hallucinations.  His thought content appears normal and appropriate.Marland Kitchen Speech and behavior are normal.  ____________________________________________   LABS (all labs ordered are listed, but only abnormal results are displayed)  Labs Reviewed  GLUCOSE, CAPILLARY - Abnormal; Notable for the following components:      Result Value   Glucose-Capillary 311 (*)    All other components within  normal limits  CBG MONITORING, ED   ____________________________________________  EKG   ____________________________________________  RADIOLOGY   ____________________________________________   PROCEDURES  Procedure(s) performed:     Procedures  Critical Care performed: No  ____________________________________________   INITIAL IMPRESSION / ASSESSMENT AND PLAN / ED COURSE  Pertinent labs & imaging results that were available during my care of the patient were reviewed by me and considered in my medical decision making (see chart for details).   Patient brought for evaluation of voluntary status.  Denies any acute symptoms to suggest need for involuntary commitment.  I will ask for consultation by our TTS service to gather additional information.  Patient denies acute medical symptoms.  He is alert well oriented.  Very pleasant.   ----------------------------------------- 9:35 PM on 04/07/2019 -----------------------------------------  Patient resting comfortably.  Has been seen by psychiatry nurse practitioner is also staffed the case with Dr. Weber Cooks.  They recommend the patient may be discharged.  They have spoken to his wife and his wife will come and pick him up.     ____________________________________________   FINAL CLINICAL IMPRESSION(S) / ED DIAGNOSES  Final diagnoses:  Major depressive disorder, remission status unspecified, unspecified whether recurrent        Note:  This document was prepared using Dragon voice recognition software and may include unintentional dictation errors       Delman Kitten, MD 04/07/19 2135

## 2019-04-08 DIAGNOSIS — F339 Major depressive disorder, recurrent, unspecified: Secondary | ICD-10-CM | POA: Diagnosis present

## 2019-04-08 DIAGNOSIS — F329 Major depressive disorder, single episode, unspecified: Secondary | ICD-10-CM | POA: Insufficient documentation

## 2019-04-08 NOTE — Consult Note (Signed)
[facility] Face-to-Face Psychiatry Consult   Reason for Consult: Medical clearance and behavioral problems Referring Physician: Dr. Fanny BienQuale Patient Identification: Keith [name] MRN:  [id number] Principal Diagnosis: <principal problem not specified> Diagnosis:  Active Problems:   Diabetic ulcer of right foot associated with diabetes mellitus due to underlying condition (HCC)   Osteomyelitis of ankle, right, acute (HCC)   Septic arthritis of ankle or foot (HCC)   Type II diabetes mellitus (HCC)   HTN (hypertension)   Neuropathy   BPH (benign prostatic hyperplasia)   Anemia   Diabetic polyneuropathy associated with type 1 diabetes mellitus (HCC)   Hypercholesterolemia   LPRD (laryngopharyngeal reflux disease)   Hypertension   Acute kidney injury (HCC)   Acute hyperkalemia   Anemia, chronic disease   Age-related nuclear cataract of both eyes   Moderate nonproliferative diabetic retinopathy of left eye without macular edema associated with type 1 diabetes mellitus (HCC)   Optic atrophy of both eyes   Proliferative diabetic retinopathy of right eye without macular edema associated with type 1 diabetes mellitus (HCC)   MDD (major depressive disorder), recurrent episode (HCC)   Total Time spent with patient: 1 hour  Subjective: "I left home this morning went to [employer] and sat there for 7 hours.  I never wanted to hurt myself or hurt anybody else." Keith [name] is a 48 y.o. male patient [facility] via law enforcement voluntary.  Per the ED triage nursing note, the patient stated he got into an argument with his wife and left the house.  He was gone for 7 hours.  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  The patient states that sometimes he goes on a medication holiday with his insulin.  He says he decided for the past four days not to take his insulin. He stated that he does monitor his blood glucose and his blood  sugar was 309 mg/dl prior to coming to the hospital. He explained that today (04/07/2019) his wife called the police saying he was suicidal.  The patient was seen face-to-face by this provider; the chart reviewed and consulted with Dr. Toni Amendlapacs and Dr. Fanny BienQuale on 04/07/2019 due to the patient's care. It was discussed with both providers that the patient does not meet the criteria to be admitted to the psychiatric inpatient unit. The patient is alert and oriented x4, calm and cooperative, and mood-congruent with an affect on evaluation. The patient does not appear to be responding to internal or external stimuli. Neither is the patient presenting with any delusional thinking. The patient denies auditory or visual hallucinations. The patient denies any suicidal, homicidal, or self-harm ideations. The patient is not presenting with any psychotic or paranoid behaviors. During an encounter with the patient, he was able to answer questions appropriately. Collateral was obtained by wife Sharee HolsterCourtney Dresch [phone]) [phone], who expresses concerns for the patient's medication noncompliance and not caring for himself.   The patient wife states he has diabetes and, at times, refuses to take his insulin for several days.  She says that he had lost his right leg due to him not caring for himself and is currently having issues with his left leg.  She stated he left their home today for 7 hours, and she had no idea where he has gone.  She said she did not know if he was safe or had harmed himself.  She states previously he had voiced, "I am  just going to ride my wheelchair into traffic."    Plan: The patient is not a safety risk to self or others and does not require psychiatric inpatient admission for stabilization and treatment.  HPI: Per Dr. Jacqualine Code; Keith [name] is a 48 y.o. male  here for evaluation with the police on a voluntary basis  Patient reports he recently had surgery left lower leg due to diabetes.  He is been  recovering well, but he reports the last 3 days he has been at home and he and his wife have just not been getting along in general.  Denies any desire to hurt himself or anyone else.  Reports he just needed to get out of the house for a while, got in his scooter and [place] from [place].  He reports he is gone for several hours, and it was reported that he was missing.  When he returned to his house or multiple police officers and he reports they offered to bring him here for evaluation.  Denies any hallucinations, desire to hurt himself or anyone else, or any suicidal intent.  Past Psychiatric History: None  Risk to Self:  No Risk to Others:  No Prior Inpatient Therapy:  No Prior Outpatient Therapy:  No  Past Medical History:  Past Medical History:  Diagnosis Date  . Diabetic peripheral neuropathy ([place])   . Hyperlipidemia   . Hypertension   . Type I diabetes mellitus ([place])     Past Surgical History:  Procedure Laterality Date  . BONE BIOPSY Right 03/19/2017   Procedure: BONE BIOPSY;  Surgeon: Edrick Kins, DPM;  Location: [place];  Service: Podiatry;  Laterality: Right;  . I&D EXTREMITY Right 03/12/2017   Procedure: IRRIGATION AND DEBRIDEMENT RIGHT ANKLE;  Surgeon: Edrick Kins, DPM;  Location: [place];  Service: Podiatry;  Laterality: Right;  . IR FLUORO GUIDE CV LINE RIGHT  01/13/2017  . IR US GUIDE VASC ACCESS RIGHT  01/13/2017  . IRRIGATION AND DEBRIDEMENT FOOT Left 04/02/2016   Procedure: IRRIGATION AND DEBRIDEMENT FOOT;  Surgeon: Samara Deist, DPM;  Location: [facility];  Service: Podiatry;  Laterality: Left;  . IRRIGATION AND DEBRIDEMENT FOOT Right 03/19/2017   Procedure: IRRIGATION AND DEBRIDEMENT RIGHT SEPTIC ANKLE;  Surgeon: Edrick Kins, DPM;  Location: [place];  Service: Podiatry;  Laterality: Right;   Family History:  Family History  Problem Relation Age of Onset  . Prostate cancer Father   . Bladder Cancer Neg Hx   .  Kidney cancer Neg Hx    Family Psychiatric  History: History reviewed. No pertinent family psychiatric history Social History:  Social History   Substance and Sexual Activity  Alcohol Use No  . Alcohol/week: 0.0 standard drinks     Social History   Substance and Sexual Activity  Drug Use No    Social History   Socioeconomic History  . Marital status: Married    Spouse name: Not on file  . Number of children: Not on file  . Years of education: Not on file  . Highest education level: Not on file  Occupational History  . Not on file  Social Needs  . Financial resource strain: Not on file  . Food insecurity    Worry: Not on file    Inability: Not on file  . Transportation needs    Medical: Not on file    Non-medical: Not on file  Tobacco  Use  . Smoking status: Never Smoker  . Smokeless tobacco: Never Used  Substance and Sexual Activity  . Alcohol use: No    Alcohol/week: 0.0 standard drinks  . Drug use: No  . Sexual activity: Not Currently  Lifestyle  . Physical activity    Days per week: Not on file    Minutes per session: Not on file  . Stress: Not on file  Relationships  . Social Musician on phone: Not on file    Gets together: Not on file    Attends religious service: Not on file    Active member of club or organization: Not on file    Attends meetings of clubs or organizations: Not on file    Relationship status: Not on file  Other Topics Concern  . Not on file  Social History Narrative  . Not on file   Additional Social History:    Allergies:   Allergies  Allergen Reactions  . Daptomycin Anaphylaxis and Shortness Of Breath    Breathing issues  . Lisinopril Other (See Comments)    Cough    Labs:  Results for orders placed or performed during the hospital encounter of 04/07/19 (from the past 48 hour(s))  Glucose, capillary     Status: Abnormal   Collection Time: 04/07/19  5:50 PM  Result Value Ref Range   Glucose-Capillary 311  (H) 70 - 99 mg/dL   Comment 1 Document in Chart     Current Facility-Administered Medications  Medication Dose Route Frequency Provider Last Rate Last Dose  . betamethasone acetate-betamethasone sodium phosphate (CELESTONE) injection 3 mg  3 mg Intramuscular Once Felecia Shelling, DPM       Current Outpatient Medications  Medication Sig Dispense Refill  . aspirin 81 MG tablet Take 81 mg by mouth daily.    . ferrous sulfate 325 (65 FE) MG tablet Take 325 mg by mouth 2 (two) times daily with a meal.     . gabapentin (NEURONTIN) 600 MG tablet Take 1,800 mg by mouth 3 (three) times daily.     . hydrochlorothiazide (HYDRODIURIL) 12.5 MG tablet     . ibuprofen (ADVIL,MOTRIN) 800 MG tablet Take 1 tablet (800 mg total) by mouth every 8 (eight) hours as needed. 30 tablet 1  . insulin aspart (NOVOLOG) 100 UNIT/ML injection Inject 10 Units into the skin 3 (three) times daily before meals. Per sliding scale (Patient taking differently: Inject 15 Units into the skin 3 (three) times daily before meals. Per sliding scale) 10 mL 1  . Insulin Glargine (BASAGLAR KWIKPEN) 100 UNIT/ML SOPN Inject 0.8 mLs (80 Units total) into the skin daily at 10 pm. 5 pen 0  . losartan (COZAAR) 50 MG tablet Take 50 mg by mouth daily.    . metFORMIN (GLUCOPHAGE) 500 MG tablet     . oxyCODONE-acetaminophen (PERCOCET) 5-325 MG tablet Take 1 tablet by mouth every 8 (eight) hours as needed for severe pain. 30 tablet 0  . PARoxetine (PAXIL) 20 MG tablet Take 20 mg by mouth daily.     . Probiotic Product (ALIGN PO) Take 1 capsule by mouth daily.    . tamsulosin (FLOMAX) 0.4 MG CAPS capsule TAKE ONE CAPSULE BY MOUTH DAILY 30 capsule 0    Musculoskeletal: Strength & Muscle Tone: decreased Gait & Station: unsteady Patient leans: N/A  Psychiatric Specialty Exam: Physical Exam  Nursing note and vitals reviewed. Constitutional: He is oriented to person, place, and time. He appears well-developed.  Eyes:  Pupils are equal, round, and  reactive to light.  Neck: Normal range of motion. Neck supple.  Cardiovascular: Normal rate.  Respiratory: Effort normal.  Musculoskeletal: Normal range of motion.  Neurological: He is alert and oriented to person, place, and time.    Review of Systems  Psychiatric/Behavioral: Positive for depression. The patient is nervous/anxious.   All other systems reviewed and are negative.   Blood pressure (!) 163/91, pulse 84, temperature 98.6 F (37 C), temperature source Oral, resp. rate 18, SpO2 100 %.There is no height or weight on file to calculate BMI.  General Appearance: Casual  Eye Contact:  Good  Speech:  Clear and Coherent  Volume:  Normal  Mood:  Depressed  Affect:  Congruent  Thought Process:  Coherent  Orientation:  Full (Time, Place, and Person)  Thought Content:  WDL and Logical  Suicidal Thoughts:  No  Homicidal Thoughts:  No  Memory:  Immediate;   Good Recent;   Good Remote;   Good  Judgement:  Intact  Insight:  Good  Psychomotor Activity:  Normal  Concentration:  Concentration: Good and Attention Span: Good  Recall:  Good  Fund of Knowledge:  Good  Language:  Good  Akathisia:  Negative  Handed:  Right  AIMS (if indicated):     Assets:  Desire for Improvement Physical Health Resilience Social Support  ADL's:  Intact  Cognition:  WNL  Sleep:   Good     Treatment Plan Summary: Plan Patient does not meet criteria for psychiatric inpatient admission.  Disposition: No evidence of imminent risk to self or others at present.   Patient does not meet criteria for psychiatric inpatient admission. Supportive therapy provided about ongoing stressors. Discussed crisis plan, support from social network, calling 911, coming to the Emergency Department, and calling Suicide Hotline.  Gillermo Murdoch, NP 04/08/2019 1:32 AM

## 2019-12-07 ENCOUNTER — Ambulatory Visit: Payer: Self-pay | Attending: Internal Medicine

## 2019-12-07 DIAGNOSIS — Z23 Encounter for immunization: Secondary | ICD-10-CM

## 2019-12-07 NOTE — Progress Notes (Signed)
   Covid-19 Vaccination Clinic  Name:  [place] Yordy    MRN: [id number] DOB: 07/22/1974  12/07/2019  Mr. Cid was observed post Covid-19 immunization for 15 minutes without incident. He was provided with Vaccine Information Sheet and instruction to access the V-Safe system.   Mr. Pischke was instructed to call 911 with any severe reactions post vaccine: Marland Kitchen Difficulty breathing  . Swelling of face and throat  . A fast heartbeat  . A bad rash all over body  . Dizziness and weakness   Immunizations Administered    Name Date Dose VIS Date Route   Pfizer COVID-19 Vaccine 12/07/2019  5:14 PM 0.3 mL 09/07/2018 Intramuscular   Manufacturer: [employer], [employer]   Lot: [id number]   NDC: [address]-1

## 2019-12-28 ENCOUNTER — Ambulatory Visit: Payer: Self-pay | Attending: Internal Medicine

## 2019-12-28 DIAGNOSIS — Z23 Encounter for immunization: Secondary | ICD-10-CM

## 2019-12-28 NOTE — Progress Notes (Signed)
° °  Covid-19 Vaccination Clinic  Name:  Keith [name]    MRN: [id number] DOB: 02-28-1971  12/28/2019  Keith [name] was observed post Covid-19 immunization for 15 minutes without incident. He was provided with Vaccine Information Sheet and instruction to access the V-Safe system.   Keith [name] was instructed to call 911 with any severe reactions post vaccine:  Difficulty breathing   Swelling of face and throat   A fast heartbeat   A bad rash all over body   Dizziness and weakness   Immunizations Administered    Name Date Dose VIS Date Route   Pfizer COVID-19 Vaccine 12/28/2019  4:47 PM 0.3 mL 09/07/2018 Intramuscular   Manufacturer: [employer], [employer]   Lot: [id number]   NDC: [address]-3

## 8387-03-15 DEATH — deceased
